# Patient Record
Sex: Female | Born: 1985
Health system: Southern US, Community
[De-identification: ages and names within clinical notes are randomized; demographics above are authoritative.]

## PROBLEM LIST (undated history)

## (undated) DIAGNOSIS — E119 Type 2 diabetes mellitus without complications: Secondary | ICD-10-CM

## (undated) DIAGNOSIS — E669 Obesity, unspecified: Secondary | ICD-10-CM

## (undated) DIAGNOSIS — J45909 Unspecified asthma, uncomplicated: Secondary | ICD-10-CM

## (undated) HISTORY — PX: TUBAL LIGATION: SHX77

---

## 2005-12-22 ENCOUNTER — Inpatient Hospital Stay (HOSPITAL_COMMUNITY): Admission: AD | Admit: 2005-12-22 | Discharge: 2005-12-24 | Payer: Self-pay | Admitting: Family Medicine

## 2005-12-22 ENCOUNTER — Ambulatory Visit: Payer: Self-pay | Admitting: Obstetrics & Gynecology

## 2005-12-25 ENCOUNTER — Ambulatory Visit: Payer: Self-pay | Admitting: Obstetrics & Gynecology

## 2005-12-28 ENCOUNTER — Inpatient Hospital Stay (HOSPITAL_COMMUNITY): Admission: AD | Admit: 2005-12-28 | Discharge: 2005-12-29 | Payer: Self-pay | Admitting: Family Medicine

## 2005-12-28 ENCOUNTER — Ambulatory Visit: Payer: Self-pay | Admitting: *Deleted

## 2006-01-01 ENCOUNTER — Ambulatory Visit: Payer: Self-pay | Admitting: Obstetrics & Gynecology

## 2006-01-11 ENCOUNTER — Ambulatory Visit: Payer: Self-pay | Admitting: Family Medicine

## 2006-01-11 ENCOUNTER — Inpatient Hospital Stay (HOSPITAL_COMMUNITY): Admission: AD | Admit: 2006-01-11 | Discharge: 2006-01-14 | Payer: Self-pay | Admitting: Obstetrics and Gynecology

## 2006-01-11 ENCOUNTER — Encounter (INDEPENDENT_AMBULATORY_CARE_PROVIDER_SITE_OTHER): Payer: Self-pay | Admitting: Specialist

## 2006-01-18 ENCOUNTER — Ambulatory Visit: Payer: Self-pay | Admitting: *Deleted

## 2007-07-16 ENCOUNTER — Inpatient Hospital Stay (HOSPITAL_COMMUNITY): Admission: AD | Admit: 2007-07-16 | Discharge: 2007-07-17 | Payer: Self-pay | Admitting: Obstetrics & Gynecology

## 2007-07-16 ENCOUNTER — Ambulatory Visit: Payer: Self-pay | Admitting: *Deleted

## 2007-07-18 ENCOUNTER — Inpatient Hospital Stay (HOSPITAL_COMMUNITY): Admission: RE | Admit: 2007-07-18 | Discharge: 2007-07-21 | Payer: Self-pay | Admitting: Obstetrics & Gynecology

## 2007-07-18 ENCOUNTER — Ambulatory Visit: Payer: Self-pay | Admitting: Obstetrics & Gynecology

## 2008-11-10 ENCOUNTER — Ambulatory Visit: Payer: Self-pay | Admitting: Obstetrics & Gynecology

## 2008-11-10 ENCOUNTER — Inpatient Hospital Stay (HOSPITAL_COMMUNITY): Admission: AD | Admit: 2008-11-10 | Discharge: 2008-11-13 | Payer: Self-pay | Admitting: Obstetrics & Gynecology

## 2010-05-29 ENCOUNTER — Encounter: Payer: Self-pay | Admitting: Obstetrics & Gynecology

## 2010-07-18 ENCOUNTER — Emergency Department (HOSPITAL_COMMUNITY)
Admission: EM | Admit: 2010-07-18 | Discharge: 2010-07-19 | Disposition: A | Discharge status: Home / Self Care | Payer: Self-pay | Attending: Emergency Medicine | Admitting: Emergency Medicine

## 2010-07-18 DIAGNOSIS — E876 Hypokalemia: Secondary | ICD-10-CM | POA: Insufficient documentation

## 2010-07-18 DIAGNOSIS — R6884 Jaw pain: Secondary | ICD-10-CM | POA: Insufficient documentation

## 2010-07-18 DIAGNOSIS — R209 Unspecified disturbances of skin sensation: Secondary | ICD-10-CM | POA: Insufficient documentation

## 2010-07-18 DIAGNOSIS — H9209 Otalgia, unspecified ear: Secondary | ICD-10-CM | POA: Insufficient documentation

## 2010-07-18 DIAGNOSIS — M542 Cervicalgia: Secondary | ICD-10-CM | POA: Insufficient documentation

## 2010-07-18 DIAGNOSIS — R51 Headache: Secondary | ICD-10-CM | POA: Insufficient documentation

## 2010-07-18 DIAGNOSIS — D573 Sickle-cell trait: Secondary | ICD-10-CM | POA: Insufficient documentation

## 2010-07-18 DIAGNOSIS — J45909 Unspecified asthma, uncomplicated: Secondary | ICD-10-CM | POA: Insufficient documentation

## 2010-07-19 LAB — BASIC METABOLIC PANEL
BUN: 8 mg/dL (ref 6–23)
Calcium: 8.7 mg/dL (ref 8.4–10.5)
Creatinine, Ser: 0.76 mg/dL (ref 0.4–1.2)
GFR calc Af Amer: >60 mL/min (ref >60)

## 2010-08-14 LAB — CBC
MCHC: 32.8 g/dL (ref 30.0–36.0)
MCV: 74.4 fL — ABNORMAL LOW (ref 78.0–100.0)
MCV: 74.6 fL — ABNORMAL LOW (ref 78.0–100.0)
Platelets: 155 10*3/uL (ref 150–400)
Platelets: 196 10*3/uL (ref 150–400)
RBC: 4.27 MIL/uL (ref 3.87–5.11)
WBC: 6.9 10*3/uL (ref 4.0–10.5)
WBC: 7.9 10*3/uL (ref 4.0–10.5)

## 2010-08-14 LAB — COMPREHENSIVE METABOLIC PANEL
ALT: 14 U/L (ref 0–35)
ALT: 9 U/L (ref 0–35)
AST: 18 U/L (ref 0–37)
AST: 23 U/L (ref 0–37)
Albumin: 2.1 g/dL — ABNORMAL LOW (ref 3.5–5.2)
Albumin: 2.6 g/dL — ABNORMAL LOW (ref 3.5–5.2)
CO2: 20 mEq/L (ref 19–32)
Calcium: 8.3 mg/dL — ABNORMAL LOW (ref 8.4–10.5)
Calcium: 8.5 mg/dL (ref 8.4–10.5)
Chloride: 106 mEq/L (ref 96–112)
Chloride: 108 mEq/L (ref 96–112)
Creatinine, Ser: 0.63 mg/dL (ref 0.4–1.2)
Creatinine, Ser: 0.63 mg/dL (ref 0.4–1.2)
GFR calc Af Amer: >60 mL/min (ref >60)
GFR calc Af Amer: >60 mL/min (ref >60)
GFR calc non Af Amer: >60 mL/min (ref >60)
Sodium: 135 mEq/L (ref 135–145)
Sodium: 138 mEq/L (ref 135–145)
Total Bilirubin: 0.7 mg/dL (ref 0.3–1.2)

## 2010-08-14 LAB — BASIC METABOLIC PANEL
BUN: 3 mg/dL — ABNORMAL LOW (ref 6–23)
Chloride: 106 mEq/L (ref 96–112)
Creatinine, Ser: 0.6 mg/dL (ref 0.4–1.2)
Glucose, Bld: 94 mg/dL (ref 70–99)
Potassium: 3.5 mEq/L (ref 3.5–5.1)

## 2010-08-14 LAB — URINALYSIS, ROUTINE W REFLEX MICROSCOPIC
Bilirubin Urine: NEGATIVE
Hgb urine dipstick: NEGATIVE
Ketones, ur: 15 mg/dL — AB
Nitrite: NEGATIVE
Protein, ur: NEGATIVE mg/dL
Specific Gravity, Urine: 1.015 (ref 1.005–1.030)
Urobilinogen, UA: 1 mg/dL (ref 0.0–1.0)

## 2010-08-14 LAB — RAPID URINE DRUG SCREEN, HOSP PERFORMED
Amphetamines: NOT DETECTED
Opiates: NOT DETECTED
Tetrahydrocannabinol: NOT DETECTED

## 2010-08-14 LAB — CROSSMATCH
ABO/RH(D): O POS
Antibody Screen: NEGATIVE

## 2010-08-14 LAB — RPR: RPR Ser Ql: NONREACTIVE

## 2010-08-14 LAB — RUBELLA SCREEN: Rubella: 31.6 IU/mL — ABNORMAL HIGH

## 2010-08-14 LAB — SICKLE CELL SCREEN: Sickle Cell Screen: POSITIVE — AB

## 2010-08-14 LAB — HEMOGLOBIN A1C: Hgb A1c MFr Bld: 5.6 % (ref 4.6–6.1)

## 2010-08-14 LAB — URIC ACID: Uric Acid, Serum: 6.4 mg/dL (ref 2.4–7.0)

## 2010-09-20 NOTE — Discharge Summary (Signed)
NAMECHELESA, [name]             ACCOUNT NO.:  [id number]   MEDICAL RECORD NO.:  [id number]          PATIENT TYPE:  INP   LOCATION:  9126                          [facility]   PHYSICIAN:  Phil D. Okey Dupre, M.D.     DATE OF BIRTH:  1986-04-12   DATE OF ADMISSION:  07/18/2007  DATE OF DISCHARGE:  07/21/2007                               DISCHARGE SUMMARY   ADMISSION DIAGNOSES:  1. Intrauterine pregnancy at 39-week 6-day gestation.  2. History of previous cesarean sections x2.  3. No prenatal care.  4. Obesity.  5. Anemia.  6. Group B streptococcus negative.   DISCHARGE DIAGNOSES:  1. Postoperative day #3 from repeat low-transverse cesarean section of      a viable infant female, weighing 6 pounds 11 ounces with Apgars 8      at one minute and 9 at five minutes.  2. History of previous cesarean sections x2.  3. No prenatal care.  4. Obesity.  5. Anemia.  6. Group B streptococcus negative.   PROCEDURES:  The patient had a repeat low-transverse cesarean section  and lysis of adhesions performed by Dr. Nicholaus Bloom on July 18, 2007.   COMPLICATIONS:  None.   CONSULTATIONS:  None.   PERTINENT LABORATORY FINDINGS:  Two days prior to admission, the patient  had a CBC with white blood cell count 6.9, hemoglobin 10.6, hematocrit  32.2, and platelets 192.  On postoperative day #1, she had an RPR that  was nonreactive.  Complete blood count was white blood cell count 7.7,  hemoglobin 8.9, hematocrit 27.1, and platelets 179,000.   BRIEF PERTINENT ADMISSION HISTORY:  Ms. Matsuura is a 25 year old gravida  3, para 2-0-0-2, 39-week 6-day gestation by LMP.  She had a late  ultrasound at 39 weeks and is fairly certain of her LMP of October 12, 2006.  She has a history of 2 previous C-sections and presents for repeat low-  transverse cesarean section.  She had one MAU visit at which time she  was posted for the cesarean section.   HOSPITAL COURSE:  The patient was admitted, and C-section was  performed  on July 18, 2007.  Repeat low-transverse C-section resulted in delivery  of a viable infant female weighing 6 pounds and 11 ounces with Apgars of  8 at one minute and 9 at five minutes.  The mother and infant tolerated  the delivery well.  She had an uncomplicated postoperative course with  normal physical examination and vital signs.  She desired to have oral  contraceptive pills for birth control and was bottle-feeding the baby.  She was in stable condition for discharge on postoperative day #3.   DISCHARGE STATUS:  Stable.   DISCHARGE MEDICATIONS:  1. Prenatal vitamins 1 tablet p.o. daily.  2. Vicodin 5 one tablet every 4 hours as needed for pain.  3. Ibuprofen 600 mg 1 tablet every 6 hours as needed for pain.  4. Colace 100 mg 1 tablet by mouth twice daily as needed while on      iron.  5. Ferrous sulfate 325 mg 1 tablet twice a day  for the first month      after delivery.   DISCHARGE INSTRUCTIONS:  1. Discharged home.  2. Regular diet.  3. No lifting greater than 10 pounds x6 weeks.  4. No sex or anything in the vagina for 6 weeks.  5. The patient is to have baby love nurse follow on postoperative day      #7 for removal of staples.  6. The patient is to follow up with the [facility]      Department for routine postpartum examination.      Karlton Lemon, MD  Electronically Signed     ______________________________  Javier Glazier. Okey Dupre, M.D.    NS/MEDQ  D:  07/21/2007  T:  07/21/2007  Job:  [id number]

## 2010-09-20 NOTE — Discharge Summary (Signed)
[name], GRANDPRE NO.:  [id number]   MEDICAL RECORD NO.:  [id number]          PATIENT TYPE:  INP   LOCATION:  9117                          [facility]   PHYSICIAN:  Scheryl Darter, MD       DATE OF BIRTH:  Oct 12, 1985   DATE OF ADMISSION:  11/10/2008  DATE OF DISCHARGE:  11/13/2008                               DISCHARGE SUMMARY   PRIMARY CARE [employer]:  [facility].   DISCHARGE DIAGNOSES:  1. Repeat low-transverse cesarean section.  2. Bilateral tubal ligation.  3. Late prenatal care.  4. Hypertension.  5. Poor social situation.   CONSULTS:  OB was heavily involved in Ms. Lohmann care from the repeat C-  section to the bilateral tubal ligation and they are aware of her  course.   PROCEDURES:  1. Repeat low-transverse cesarean section on November 10, 2008.  2. Bilateral tubal ligation on November 11, 2008, Please see notes for the      details.   LABORATORY DATA:  Pertinent lab on November 10, 2008, potassium was 2.8  repleted on November 12, 2008, it was 3.5, also significant.  Hemoglobin  10.4, hematocrit 38.1 on November 12, 2008.   Checked further labs; sickle cell screen positive for trait.  Hemoglobin  A1c 5.6, RPR nonreactive, rubella immune, HIV nonreactive, hepatitis B  surface antigen negative.  Urine drug screen panel negative and PIH labs  were essentially negative with the exception of the above mentioned  potassium and elevated alkaline phosphatase at 209.   BRIEF HOSPITAL COURSE:  This is a 25 year old now G4, P4, to being  discharged on postoperative day #4 status post repeat low-transverse  cesarean section and bilateral tubal ligation.  Ms. Bahena was admitted  in Labor and had her repeat cesarean section.  There were essentially no  complications with this procedure.  She also on postop day #2 had a  bilateral tubal ligation, which went well with no complications.  Significantly following her cesarean section, Ms. Kreft was  found to  have hypertension with systolics mostly in the 150s.  It was treated on  postop day #2 starting with oral labetalol 100 mg twice a day.  That was  continued on postop day #3 and she was given prescription for 2 months  and instructed to follow up with the [facility] Department in 2 weeks.  She  was not given hydrochlorothiazide due to her low potassium on July 6.  She is aware do not discontinue this medicine on her own and to make her  doctors appointments to manage her hypertension.  She is aware of the  danger of untreated hypertension of the course of her life.  Also, of  note, Ms. Slaymaker had a social work visit while in this hospitalization.  They will see her as needed.   Discharge instructions were given verbally to Ms. Kouba.  Also, to avoid  washing her incision wound for the next 6 weeks and avoid taking bath or  swimming in lakes or pools.  She also was informed about the importance  of taking her  labetalol every day and the necessity to make her followup  appointments.   PENDING ISSUES TO BE FOLLOWED AFTER DISCHARGE:  1. Hypertension f/u on jul7 19  weeks at [facility].  2. Routine postnatal visit in 6 weeks at the [facility] and for      appointments, please see above.   DISCHARGE CONDITION:  The patient was discharged in stable medical  condition to home.      Clementeen Graham, MD      Scheryl Darter, MD  Electronically Signed    EC/MEDQ  D:  11/13/2008  T:  11/13/2008  Job:  [id number]

## 2010-09-20 NOTE — Op Note (Signed)
[name], [name]             ACCOUNT NO.:  [id number]   MEDICAL RECORD NO.:  [id number]          PATIENT TYPE:  INP   LOCATION:  9126                          [facility]   PHYSICIAN:  Allie Bossier, MD        DATE OF BIRTH:  09-Mar-1986   DATE OF PROCEDURE:  07/18/2007  DATE OF DISCHARGE:                               OPERATIVE REPORT   PREOPERATIVE DIAGNOSES:  1. Intrauterine pregnancy at 39 weeks 6 days gestation.  2. No prenatal care.  3. Obesity.  4. Anemia.  5. Group B streptococcus negative.   POSTOPERATIVE DIAGNOSES:  1. Intrauterine pregnancy at 39 weeks 6 days gestation.  2. No prenatal care.  3. Obesity.  4. Nuchal cord times one.  5. Anemia.  6. Group B streptococcus negative.   PROCEDURE:  Repeat low transverse cesarean section and lysis of  adhesions.   SURGEON:  Allie Bossier, M.D.   ASSISTANT:  Karlton Lemon, M.D.   ANESTHESIA:  Spinal.   FINDINGS:  1. Viable infant female, weighing 6 pounds 10 ounces, with Apgars of 8      at one minute, 9 at five minutes, in vertex presentation.  2. Nuchal cord times one.  3. Clear amniotic fluid.  4. Normal female pelvic anatomy with normal changes of pregnancy.  5. Extensive adhesions intraperitoneally.   ESTIMATED BLOOD LOSS:  500 mL.   DRAINS:  Foley with clear yellow urine throughout the entire procedure.   COMPLICATIONS:  None immediate.   SPECIMENS:  Placenta to labor and delivery; cord blood to the cord bank.   INDICATIONS FOR PROCEDURE:  Ms. [name] is a 25 year old gravida 3, para 2-  0-0-2, at 8 weeks 6 days gestation by an LMP of October 12, 2006.  She has  had no prenatal care during this pregnancy and has a history of two  previous C-sections.  She was seen two days previously, on March 10, in  the maternity admission unit, at which time she was scheduled for repeat  low transverse C- section at 39 weeks.   DESCRIPTION OF PROCEDURE:  The patient was taken to the operating room  and, after  obtaining adequate spinal anesthesia, was prepped and draped  in the usual sterile manner in the supine position with left lateral  uterine displacement.  After insuring adequate anesthesia, a  Pfannenstiel skin incision was made, using the scalpel.  The incision  was carried down through the subcutaneous tissues, using the scalpel.  The rectus fascia was nicked in the midline and the incision was  extended laterally in each direction, using the Mayo scissors.  The  Bovie cautery was then used to incise through the rectus fascia in the  midline.  The rectus muscles were then partially separated in a modified  Maylard technique, cutting through approximately 50% of the muscle  bilaterally.  Good hemostasis was noted as the muscles were dissected.  The parietal peritoneum was then identified and being grasped with two  hemostats, and was then entered sharply with Metzenbaum scissors.  The  peritoneum was then dissected under direct visualization with the  Bovie  cautery.  Adhesions were then broken down with Bovie cautery.  The  uterus was well-visualized and a bladder blade was placed.  A low  transverse uterine incision was then made and the incision was extended  laterally and superiorly with blunt dissection and bandage scissors.  The amnion was ruptured with clear fluid noted.  A hand was then placed  within the uterine cavity, used to elevate and flex the head of the  infant, which was delivered without difficulty.  The infant was bulb  suctioned after delivery of the head.  The infant's body was then  delivered atraumatically.  The infant spontaneously cried upon delivery.  The cord was clamped times two and incised between the clamps.  The  placenta was then delivered manually and cord blood was to be collected  by the cord blood bank.  The endometrial cavity was wiped free of any  traces of membranes with wet laparotomy sponges.  The uterine incision  was then closed with one stitch  of running, locking 0 chromic.  One area  of bleeding was controlled with a figure-of-eight suture.  The incision  was then inspected and found to have good hemostasis.  The ovaries were  then palpated bilaterally and felt normal.  The rectus fascia was then  reapproximated with one suture of 0 Prolene.  Small areas of bleeding in  the subcutaneous tissue were controlled using the Bovie cautery.  Skin  was reapproximated with stainless steel skin staples.  Sponge, needle  and instrument counts were correct times two.  The patient tolerated the  procedure well and went to the postanesthesia care unit in stable  condition.      Karlton Lemon, MD  Electronically Signed     ______________________________  Allie Bossier, MD    NS/MEDQ  D:  07/18/2007  T:  07/19/2007  Job:  [id number]

## 2010-09-20 NOTE — Op Note (Signed)
[name], KRAUSZ NO.:  [id number]   MEDICAL RECORD NO.:  [id number]          PATIENT TYPE:  INP   LOCATION:  9117                          [facility]   PHYSICIAN:  Scheryl Darter, MD       DATE OF BIRTH:  10-19-1985   DATE OF PROCEDURE:  11/10/2008  DATE OF DISCHARGE:                               OPERATIVE REPORT   PROCEDURE:  Repeat low-transverse cesarean section, bilateral tubal  ligation.   PREOPERATIVE DIAGNOSES:  1. Intrauterine pregnancy at [redacted] weeks gestation in labor.  2. Three previous cesarean section.  3. Undesired fertility.   POSTOPERATIVE DIAGNOSES:  1. Intrauterine pregnancy, delivered.  2. Permanent sterilization.   SURGEON:  Scheryl Darter, MD   ASSISTANT:  Odie Sera, DO   ANESTHESIA:  Epidural.   ESTIMATED BLOOD LOSS:  800 mL.   COMPLICATIONS:  None.   DRAINS:  Foley catheter.   COUNTS:  Correct.   HOSPITAL COURSE:  The patient gave written consent for repeat cesarean  section at [redacted] weeks gestation.  The patient has no prenatal care.  She  desired permanent sterilization and signed consent.  The patient  identification was confirmed.  She was brought to the OR and epidural  anesthesia was induced.  She is placed in dorsal supine position left  lateral tilt.  Adequate epidural anesthesia was confirmed.  Abdomen was  sterilely prepped and draped.  Foley catheter was placed.  A #10 blade  was used to make Pfannenstiel incision site of previous cesarean section  scars.  Incision was carried down to the fascia and fascia was incised  and incision was extended transversely with scissors.  Good hemostasis  was assured.  Fascia was separated from the underlying scar and tissue  attachments with blunt and sharp dissection.  Peritoneum was entered at  this point.  There were adhesions of the omentum to the anterior  abdominal wall, but these were filmy.  Adhesions were taken down with  cautery.  Peritoneal cavity was exposed.   Bladder was dissected down  using Metzenbaum scissors.  Bladder blade was inserted.  Lower uterine  segment was identified.  A #10 blade was used to make a transverse  incision in the lower uterine segment and peritoneal catheter in the  abdominal skin and the uterine cavity was entered.  Clear fluid was  expressed.  Incision was extended transversely with blunt traction.  Fetal head was elevated from the pelvis without very scant and head was  delivered.  Mouth and nose were cleared with bulb suction.  Infant was  delivered atraumatically and was vigorous at birth.  Infant was live  born female at 83 8 pounds 5 ounces, Apgars 8 and 9.  Cord was clamped  and cut, and the infant was handed to the pediatric personnel.  Sent to  labor and delivery.  Uterine cavity was explored.  The patient received  IV Pitocin.  She had received IV Kefzol prior to incision.  Uterus was  exteriorized.  Bladder blade was reinserted.  A 0 Vicryl was used in  running locking suture and closed the uterine  incision.  Good hemostasis  was seen.  Attention then turned to the sterilization.  Filshie clips  were placed.  The tubes on both sides with good positioning seen.  Both  tubes and ovaries appeared normal.  The uterus was returned to the  abdominal cavity.  Pelvic gutters were irrigated.  Good hemostasis was  assured.  A more omental wall adhesions were lysed using cautery.  The  anterior peritoneum was closed with running suture with 2-0 Vicryl.  Fascia was closed with running suture with 0 Vicryl.  Interrupted  sutures were placed in Scarpa fascia with a 2-0 plain gut.  Good  hemostasis was assured in the incision.  Incision was irrigated.  Skin  was closed with a running subcuticular suture with 4-0 Vicryl.  Sterile  dressing was applied.  The patient tolerated the procedure well without  complications.  She was brought in stable condition and recovered.  Estimated blood loss was 8-9 mL.      Scheryl Darter, MD  Electronically Signed     JA/MEDQ  D:  11/10/2008  T:  11/11/2008  Job:  [id number]

## 2010-09-23 NOTE — Op Note (Signed)
[name], [name]             ACCOUNT NO.:  [id number]   MEDICAL RECORD NO.:  [id number]          PATIENT TYPE:  INP   LOCATION:                                [facility]   PHYSICIAN:  Tanya S. Shawnie Pons, M.D.   DATE OF BIRTH:  1985/08/25   DATE OF PROCEDURE:  01/11/2006  DATE OF DISCHARGE:                                 OPERATIVE REPORT   PREOPERATIVE DIAGNOSIS:  Intrauterine pregnancy at 36 6/7 weeks, breech  fetus, IUGR, oligohydramnios, previous cesarean section.   POSTOPERATIVE DIAGNOSIS:  Intrauterine pregnancy at 36 6/7 weeks, breech  fetus, IUGR, oligohydramnios, previous cesarean section.   PROCEDURE:  Repeat low transverse cesarean section.   SURGEON:  Shelbie Proctor. Shawnie Pons, M.D.   ASSISTANT:  Paticia Stack, M.D.   ANESTHESIA:  Spinal.   SPECIMENS:  Placenta sent to pathology.   ESTIMATED BLOOD LOSS:  1000 mL.   COMPLICATIONS:  None.   FINDINGS:  Viable female infant, Apgars 8 and 9, weight 4 pounds 7 ounces,  pH 7.3.   INDICATIONS FOR PROCEDURE:  The patient is a 25 year old G2, P1-0-0-1, at 9  6/[redacted] weeks gestation by dates.  The patient is very sure about her dates.  There was some discrepancy with the 31 week ultrasound but estimated date of  confinement is February 20, 2006.  Very small baby.  The patient had non-  reassuring biophysical profile, developed oligohydramnios, was recommended  for a C-section per MFM.   DESCRIPTION OF PROCEDURE:  The patient was taken to the operating room where  spinal anesthesia was found to be adequate.  She was then prepped and draped  in a normal sterile fashion in the dorsal supine position with a leftward  tilt.  A Pfannenstiel skin incision was then made with a scalpel and carried  through to the underlying layer of fascia.  The fascia was incised in the  midline and the incision extended laterally with the Mayo scissors.  The  superior aspect of the fascial incision was then grasped with the Kocher  clamps, elevated,  and the underlying rectus muscles dissected off bluntly.  Attention was turned to the inferior aspect of the incision which, in a  similar fashion, was grasped, tented up with the Kocher clamps, and the  rectus muscles dissected off bluntly.  The rectus muscles were then  separated in the midline and the peritoneum identified and entered sharply  with the Metzenbaum scissors.  The peritoneal incision was then extended  superiorly and inferiorly with good visualization of the bladder.  The  omentum was adherent to the anterior abdominal wall and the uterus.  It was  taken down by electrocautery and sequential clamping, was cut, and tied  using free ligature.  The left rectus muscle was also taken down using  electrocautery.  The bladder blade was then inserted and the vesicouterine  peritoneum identified.  The lower uterine segment was incised in a  transverse fashion with the scalpel.  The uterine incision was extended  laterally.  The bladder blade was removed.  The infant was found in a breech  position  with the buttocks presenting.  The infant was delivered  atraumatically.  The nose and mouth were suctioned with the bulb, the cord  clamped and cut, the infant taken over to the warmer, NICU present, cord  gases were obtained, cord blood was sent.  The placenta was then removed  manually.  The uterus was cleared of all clots and debris.  The uterine  incision was repaired with #1 chromic in a running locked fashion.  The  uterus was closed with one layer.  Pressure was applied to the uterine  incision.  It was rechecked 2-3 times with good hemostasis.  The fascia was  reapproximated with 0 Vicryl in a running fashion.  The skin was closed with  staples.  The patient tolerated the procedure well.  0.25% Marcaine was used  for local anesthesia around the incision.  Sponge, lap, and needle counts  were correct.  The patient was taken to the recovery room in stable  condition.      ______________________________  Paticia Stack, MD    ______________________________  Shelbie Proctor. Shawnie Pons, M.D.    LNJ/MEDQ  D:  01/11/2006  T:  01/11/2006  Job:  [id number]

## 2010-09-23 NOTE — Discharge Summary (Signed)
[name], [name]             ACCOUNT NO.:  [id number]   MEDICAL RECORD NO.:  [id number]          PATIENT TYPE:  INP   LOCATION:  9156                          [facility]   PHYSICIAN:  Tanya S. Shawnie Pons, M.D.   DATE OF BIRTH:  05/08/1986   DATE OF ADMISSION:  12/22/2005  DATE OF DISCHARGE:  12/24/2005                                 DISCHARGE SUMMARY   ADMISSION DIAGNOSES:  1. Thirty-five and two-sevenths week intrauterine pregnancy with no      prenatal care, suspicious for intrauterine growth restriction with      elevated UASD Doppler flow.  2. History of gestational diabetes.  3. Previous cesarean section.   DISCHARGE DIAGNOSES:  1. Thirty-five and two-sevenths with no prenatal care.  2. Probable A2 or B gestational diabetes without treatment.  3. Fetal growth restriction with abnormal Dopplers.  4. Breech presentation.  5. Fetal risk for sickle cell disease 1/4.   HISTORY:  This is  G2, P1-0-0-1, presenting at 35 weeks by menstrual dating  who had no prenatal care but was having some cramping.  She felt that she  may be contracting, had no rupture of membranes, no bleeding, good fetal  movement.  She states she did get some early prenatal care in [place] and  reported [facility] was February 05, 2006.  She has no known pregnancy complications.   MEDS:  Vitamins.   ALLERGIES:  NONE.   OB HISTORY:  In April, 2006, she had a primary LTCS at full-term, 6 pounds,  her fetal heart rate nonreassuring, she was not in labor, she did have diet  controlled gestational diabetes.   MEDICAL HISTORY:  Significant for sickle cell trait, father of baby also has  sickle cell trait.  She also has history of asthma.   FAMILY HISTORY:  Significant for diabetes and chronic hypertension.   ADMISSION PHYSICAL EXAM:  BP 131/72, afebrile, no distress.  LUNGS:  Clear.  ABDOMEN:  Gravid.  EXTREMITIES:  Without edema.   Fetal heart rate was 140 and reactive with no decelerations and moderate  variability.  Her complete OB ultrasound found estimated fetal weight to be  less than 50% based on her sure LMP and her uterine umbilical artery  systolic/diastolic ratio was slightly elevated at 4.22.  She was admitted  for observation and plan was to get a Maternal Fetal Medicine consult.  She  had a prolonged hospital course.  She did have a prolonged negative NST and  reassuring __________.  It was felt that induction was not indicated due to  the problems with dating the pregnancy.  Glucose challenge test was done  result was 203.  Her fasting during the hospitalization was 106 and PC  dinner was 147, this was after she was put on a carb restricted diet.  Fetal  heart rate tracing remained reassuring.  On hospital day 2, she was  discharged home in consultation with Dr. Penne Lash.  Follow up was to be at  [facility], with Maternal Fetal Medicine consult, both on December 25, 2005.   DISCHARGE MEDS:  1. Prenatal vitamin  one a day.  2. Glyburide 1.25 mg p.o. q.a.m., 2.5 mg h.s.   She got a Glucometer to do home sugar testing and saw the nutritionist in  consultation and was told to do __________ count until her next visit.  Discharged home in stable condition.      Deirdre Christy Gentles, C.N.M.    ______________________________  Shelbie Proctor. Shawnie Pons, M.D.    DP/MEDQ  D:  01/13/2006  T:  01/14/2006  Job:  [id number]

## 2010-09-23 NOTE — Discharge Summary (Signed)
NAMETAMANI, DURNEY             ACCOUNT NO.:  [id number]   MEDICAL RECORD NO.:  [id number]          PATIENT TYPE:  INP   LOCATION:  9124                          FACILITY:  [facility]   PHYSICIAN:  Phil D. Okey Dupre, M.D.     DATE OF BIRTH:  [birth year]   DATE OF ADMISSION:  01/11/2006  DATE OF DISCHARGE:  01/14/2006                                 DISCHARGE SUMMARY   ADMISSION DIAGNOSES:  1. Intrauterine pregnancy at 34 weeks.  2. Oligohydramnios.  3. Intrauterine growth retardation.  4. Breech presentation.  5. Previous cesarean section.   DISCHARGE DIAGNOSES:  1. Term intrauterine pregnancy, delivered via cesarean section.  2. Oligohydramnios.  3. Breech presentation.  4. Intrauterine growth retardation.  5. Anemia of pregnancy.  6. Repeat cesarean section.   PRENATAL LABS:  Blood type O positive, antibody negative. Rubella immune.  Hepatitis B surface antigen negative. Syphilis nonreactive. HIV nonreactive.  Gonorrhea/Chlamydia negative. GBS unknown.   HISTORY OF PRESENT ILLNESS:  The patient is a 25 year old gravida 2, para 1,  0, 0, 1 who presented at 34-2/[redacted] weeks gestation by a third trimester  ultrasound. She presented with a non reassuring physical profile, AFI of 3.5  cm, breech presentation found on ultrasound. Patient with a history of a  prior cesarean section and was taken for repeat cesarean section. Her  prenatal course was complicated by late prenatal care, gestational diabetic,  history of a prior cesarean section. [facility] with onset of care at 31+ weeks.   HOSPITAL COURSE:  The patient was admitted and taken for repeat cesarean  section. Please see operative note for complete details. The patient  remained hemodynamically stable throughout the hospital course with no  complications. Her postpartum blood sugars were in the low 100's. Patient  was felt to be stable for discharge on postoperative day number 3. She was  discharged home  with instructions to follow up at [facility] in 6 weeks.   DISCHARGE MEDICATIONS:  Included Ibuprofen 600 mg q.6 hours p.r.n. pain,  Micronor 1 tablet daily, Colace 100 mg b.i.d.     ______________________________  Paticia Stack, MD    ______________________________  Javier Glazier. Okey Dupre, M.D.    LNJ/MEDQ  D:  03/13/2006  T:  03/13/2006  Job:  [id number]

## 2011-01-30 LAB — WET PREP, GENITAL

## 2011-01-30 LAB — URINALYSIS, ROUTINE W REFLEX MICROSCOPIC
Nitrite: NEGATIVE
Protein, ur: NEGATIVE
Urobilinogen, UA: 0.2

## 2011-01-30 LAB — TYPE AND SCREEN: ABO/RH(D): O POS

## 2011-01-30 LAB — DIFFERENTIAL
Basophils Relative: 0
Eosinophils Absolute: 0.1
Eosinophils Relative: 2
Lymphs Abs: 1.5
Monocytes Relative: 8
Neutrophils Relative %: 67

## 2011-01-30 LAB — CBC
HCT: 27.1 — ABNORMAL LOW
HCT: 32.2 — ABNORMAL LOW
Hemoglobin: 10.6 — ABNORMAL LOW
Hemoglobin: 8.9 — ABNORMAL LOW
MCHC: 32.8
MCHC: 32.9
MCV: 74.6 — ABNORMAL LOW
MCV: 75.6 — ABNORMAL LOW
Platelets: 178
Platelets: 192
RBC: 3.59 — ABNORMAL LOW
RBC: 4.32
RDW: 16.3 — ABNORMAL HIGH
RDW: 16.5 — ABNORMAL HIGH
WBC: 6.9
WBC: 7.7

## 2011-01-30 LAB — SYPHILIS: RPR W/REFLEX TO RPR TITER AND TREPONEMAL ANTIBODIES, TRADITIONAL SCREENING AND DIAGNOSIS ALGORITHM
RPR Ser Ql: NONREACTIVE
RPR Ser Ql: NONREACTIVE

## 2011-01-30 LAB — RAPID URINE DRUG SCREEN, HOSP PERFORMED
Amphetamines: NOT DETECTED
Barbiturates: NOT DETECTED

## 2011-01-30 LAB — SICKLE CELL SCREEN: Sickle Cell Screen: POSITIVE — AB

## 2011-01-30 LAB — STREP B DNA PROBE

## 2011-01-30 LAB — GC/CHLAMYDIA PROBE AMP, GENITAL: GC Probe Amp, Genital: NEGATIVE

## 2011-01-30 LAB — HIV ANTIBODY (ROUTINE TESTING W REFLEX): HIV: NONREACTIVE

## 2011-01-30 LAB — CCBB MATERNAL DONOR DRAW

## 2011-01-30 LAB — HEPATITIS B SURFACE ANTIGEN: Hepatitis B Surface Ag: NEGATIVE

## 2012-10-26 ENCOUNTER — Encounter (HOSPITAL_COMMUNITY): Payer: Self-pay | Admitting: *Deleted

## 2012-10-26 ENCOUNTER — Emergency Department (HOSPITAL_COMMUNITY)
Admission: EM | Admit: 2012-10-26 | Discharge: 2012-10-26 | Disposition: A | Discharge status: Home / Self Care | Payer: Self-pay | Attending: Emergency Medicine | Admitting: Emergency Medicine

## 2012-10-26 DIAGNOSIS — W57XXXA Bitten or stung by nonvenomous insect and other nonvenomous arthropods, initial encounter: Secondary | ICD-10-CM

## 2012-10-26 DIAGNOSIS — Y9389 Activity, other specified: Secondary | ICD-10-CM | POA: Insufficient documentation

## 2012-10-26 DIAGNOSIS — Y9289 Other specified places as the place of occurrence of the external cause: Secondary | ICD-10-CM | POA: Insufficient documentation

## 2012-10-26 DIAGNOSIS — E669 Obesity, unspecified: Secondary | ICD-10-CM | POA: Insufficient documentation

## 2012-10-26 DIAGNOSIS — R599 Enlarged lymph nodes, unspecified: Secondary | ICD-10-CM | POA: Insufficient documentation

## 2012-10-26 DIAGNOSIS — R209 Unspecified disturbances of skin sensation: Secondary | ICD-10-CM | POA: Insufficient documentation

## 2012-10-26 DIAGNOSIS — J45909 Unspecified asthma, uncomplicated: Secondary | ICD-10-CM | POA: Insufficient documentation

## 2012-10-26 DIAGNOSIS — R51 Headache: Secondary | ICD-10-CM | POA: Insufficient documentation

## 2012-10-26 DIAGNOSIS — R591 Generalized enlarged lymph nodes: Secondary | ICD-10-CM

## 2012-10-26 DIAGNOSIS — IMO0001 Reserved for inherently not codable concepts without codable children: Secondary | ICD-10-CM | POA: Insufficient documentation

## 2012-10-26 HISTORY — DX: Unspecified asthma, uncomplicated: J45.909

## 2012-10-26 HISTORY — DX: Obesity, unspecified: E66.9

## 2012-10-26 MED ORDER — TRIAMCINOLONE ACETONIDE 0.5 % EX OINT: TOPICAL_OINTMENT | Freq: Two times a day (BID) | CUTANEOUS | Status: DC

## 2012-10-26 NOTE — ED Provider Notes (Signed)
History     CSN: [id number]  Arrival date & time 10/26/12  1339   First MD Initiated Contact with Patient 10/26/12 1459      Chief Complaint  Patient presents with  . Insect Bite  . Headache    (Consider location/radiation/quality/duration/timing/severity/associated sxs/prior treatment) The history is provided by the patient.    Patient presents to the ED for mosquito bites to right upper arm.  These lesions are very pruritic, and last night she started having intermittent episodes of numbness and paresthesias to her right arm and fingers which quickly resolved.  Prior mosquito bites without localized reaction.  States she woke up this morning with 2 small knots to her left posterior neck. They are not painful but they have been getting progressively bigger throughout the day. They are non-tender and non-draining but are causing somewhat of a headache.  Headache not associated with photophobia, phonophobia, aura, visual disturbance, confusion, AMS, or neck pain.  Has not taken any meds or applied any creams for her sx.  Past Medical History  Diagnosis Date  . Asthma   . Obesity     Past Surgical History  Procedure Laterality Date  . Tubal ligation      History reviewed. No pertinent family history.  History  Substance Use Topics  . Smoking status: Not on file  . Smokeless tobacco: Not on file  . Alcohol Use: No    OB History   Grav Para Term Preterm Abortions TAB SAB Ect Mult Living                  Review of Systems  Skin: Positive for wound.  All other systems reviewed and are negative.    Allergies  Review of patient's allergies indicates no known allergies.  Home Medications  No current outpatient prescriptions on file.  BP 128/58  Pulse 71  Temp(Src) 98.4 F (36.9 C) (Oral)  Resp 16  SpO2 99%  Physical Exam  Nursing note and vitals reviewed. Constitutional: She is oriented to person, place, and time. She appears well-developed and  well-nourished.  HENT:  Head: Normocephalic and atraumatic.  Mouth/Throat: Oropharynx is clear and moist.  Eyes: Conjunctivae and EOM are normal. Pupils are equal, round, and reactive to light.  Neck: Normal range of motion. Neck supple. No rigidity.  No meningeal signs  Cardiovascular: Normal rate, regular rhythm and normal heart sounds.   Pulmonary/Chest: Effort normal and breath sounds normal.  Musculoskeletal: Normal range of motion.  2 small mosquito bites to right upper arm, pruritic; mild localized swelling without erythema, induration, or signs of infection  Lymphadenopathy:       Head (right side): Occipital adenopathy present.  Swollen lymph nodes of right occipital area, non-tender  Neurological: She is alert and oriented to person, place, and time.  Skin: Skin is warm and dry.  Psychiatric: She has a normal mood and affect.    ED Course  Procedures (including critical care time)  Labs Reviewed - No data to display No results found.   1. Bug bites   2. Lymphadenopathy       MDM   Mosquito bites of RUE with mild, localized swelling.  No erythema or signs of infection.  Swollen lymph nodes to right occipital area- no TTP, induration, or fluctuance.  Rx triamcinolone cream.  Instructed to monitor lymph nodes- if become painful may need FU.  Discussed plan with pt, she agreed.  Return precautions advised.      Garlon Hatchet, PA-C  10/26/12 2032 

## 2012-10-26 NOTE — ED Provider Notes (Signed)
Medical screening examination/treatment/procedure(s) were performed by non-physician practitioner and as supervising physician I was immediately available for consultation/collaboration.  Ethelda Chick, MD 10/26/12 2046

## 2012-10-26 NOTE — ED Notes (Signed)
Reports getting a mosquito bite to right upper arm two days ago, last night started having intermittent episodes of numbness to right arm. No neuro deficits noted at triage. Woke up this am with two knots to base of neck and headache.

## 2013-04-01 ENCOUNTER — Emergency Department (HOSPITAL_COMMUNITY): Payer: Self-pay

## 2013-04-01 ENCOUNTER — Encounter (HOSPITAL_COMMUNITY): Payer: Self-pay | Admitting: Emergency Medicine

## 2013-04-01 ENCOUNTER — Emergency Department (HOSPITAL_COMMUNITY)
Admission: EM | Admit: 2013-04-01 | Discharge: 2013-04-01 | Disposition: A | Discharge status: Home / Self Care | Payer: Self-pay | Attending: Emergency Medicine | Admitting: Emergency Medicine

## 2013-04-01 DIAGNOSIS — R0789 Other chest pain: Secondary | ICD-10-CM | POA: Insufficient documentation

## 2013-04-01 DIAGNOSIS — J45909 Unspecified asthma, uncomplicated: Secondary | ICD-10-CM | POA: Insufficient documentation

## 2013-04-01 DIAGNOSIS — R209 Unspecified disturbances of skin sensation: Secondary | ICD-10-CM | POA: Insufficient documentation

## 2013-04-01 DIAGNOSIS — R079 Chest pain, unspecified: Secondary | ICD-10-CM

## 2013-04-01 DIAGNOSIS — E669 Obesity, unspecified: Secondary | ICD-10-CM | POA: Insufficient documentation

## 2013-04-01 DIAGNOSIS — R112 Nausea with vomiting, unspecified: Secondary | ICD-10-CM | POA: Insufficient documentation

## 2013-04-01 DIAGNOSIS — Z3202 Encounter for pregnancy test, result negative: Secondary | ICD-10-CM | POA: Insufficient documentation

## 2013-04-01 LAB — BASIC METABOLIC PANEL
Calcium: 9.2 mg/dL (ref 8.4–10.5)
Creatinine, Ser: 0.78 mg/dL (ref 0.50–1.10)
GFR calc Af Amer: >90 mL/min (ref >90)
GFR calc non Af Amer: >90 mL/min (ref >90)
Sodium: 139 mEq/L (ref 135–145)

## 2013-04-01 LAB — CBC
MCH: 24.6 pg — ABNORMAL LOW (ref 26.0–34.0)
MCHC: 33.1 g/dL (ref 30.0–36.0)
MCV: 74.4 fL — ABNORMAL LOW (ref 78.0–100.0)
Platelets: 232 10*3/uL (ref 150–400)
RDW: 14.1 % (ref 11.5–15.5)

## 2013-04-01 LAB — POCT PREGNANCY, URINE: Preg Test, Ur: NEGATIVE

## 2013-04-01 LAB — POCT I-STAT TROPONIN I: Troponin i, poc: 0 ng/mL (ref 0.00–0.08)

## 2013-04-01 NOTE — ED Notes (Signed)
Pt reports "tingling of tongue" followed by left arm tingling and sharp pain and chest pain. No sob with this.

## 2013-04-01 NOTE — ED Provider Notes (Signed)
CSN: [id number]     Arrival date & time 04/01/13  1632 History   First MD Initiated Contact with Patient 04/01/13 1916     Chief Complaint  Patient presents with  . Chest Pain   (Consider location/radiation/quality/duration/timing/severity/associated sxs/prior Treatment) HPI [name] is a 27 y.o. female since emergency department complaining of an episode of chest pain that began suddenly when she was getting out of the car. States symptoms lasted approximately an hour and resolved at this time. Patient states she felt sharp pain in the left side of the chest, followed by tingling in left arm, and "tingling of the tongue." Patient states that she has never had these symptoms before, and states that it worried her so she came to emergency department. Patient states that nothing makes her symptoms better or worse. Pain does not radiate. Denies associated shortness of breath, dizziness. States she did get nauseated and vomited once. Patient states she has history of asthma but this does not feel like her asthma. Patient denies any fever, chills, malaise. She denies any cough. She denies any recent travel or surgeries. She's not a smoker. She has no history of blood clots.  Past Medical History  Diagnosis Date  . Asthma   . Obesity    Past Surgical History  Procedure Laterality Date  . Tubal ligation     No family history on file. History  Substance Use Topics  . Smoking status: Never Smoker   . Smokeless tobacco: Not on file  . Alcohol Use: No   OB History   Grav Para Term Preterm Abortions TAB SAB Ect Mult Living                 Review of Systems  Constitutional: Negative for fever and chills.  HENT: Negative for congestion.   Respiratory: Positive for chest tightness. Negative for cough, shortness of breath and wheezing.   Cardiovascular: Positive for chest pain. Negative for palpitations and leg swelling.  Gastrointestinal: Negative for nausea, vomiting, abdominal pain  and diarrhea.  Genitourinary: Negative for dysuria and flank pain.  Musculoskeletal: Negative for arthralgias, myalgias, neck pain and neck stiffness.  Skin: Negative for rash.  Neurological: Negative for dizziness, weakness and headaches.  All other systems reviewed and are negative.    Allergies  Review of patient's allergies indicates no known allergies.  Home Medications   Current Outpatient Rx  Name  Route  Sig  Dispense  Refill  . acetaminophen (TYLENOL) 325 MG tablet   Oral   Take 650 mg by mouth daily as needed for mild pain or headache.          BP 124/70  Pulse 74  Temp(Src) 98.4 F (36.9 C) (Oral)  Resp 20  Ht 5\' 2"  (1.575 m)  Wt 220 lb (99.791 kg)  BMI 40.23 kg/m2  SpO2 100%  LMP 03/26/2013 Physical Exam  Nursing note and vitals reviewed. Constitutional: She appears well-developed and well-nourished. No distress.  HENT:  Head: Normocephalic.  Eyes: Conjunctivae are normal.  Neck: Neck supple.  Cardiovascular: Normal rate, regular rhythm and normal heart sounds.   Pulmonary/Chest: Effort normal and breath sounds normal. No respiratory distress. She has no wheezes. She has no rales. She exhibits no tenderness.  Abdominal: Soft. Bowel sounds are normal. She exhibits no distension. There is no tenderness. There is no rebound.  Musculoskeletal: She exhibits no edema.  Neurological: She is alert.  Skin: Skin is warm and dry.  Psychiatric: She has a normal mood and affect.  Her behavior is normal.    ED Course  Procedures (including critical care time) Labs Review Labs Reviewed  CBC - Abnormal; Notable for the following:    RBC 5.20 (*)    MCV 74.4 (*)    MCH 24.6 (*)    All other components within normal limits  BASIC METABOLIC PANEL - Abnormal; Notable for the following:    Glucose, Bld 108 (*)    All other components within normal limits  D-DIMER, QUANTITATIVE  POCT PREGNANCY, URINE  POCT I-STAT TROPONIN I   Imaging Review Dg Chest 2  View  04/01/2013   CLINICAL DATA:  Mid chest pain  EXAM: CHEST  2 VIEW  COMPARISON:  None.  FINDINGS: The heart size and mediastinal contours are within normal limits. Both lungs are clear. The visualized skeletal structures are unremarkable.  IMPRESSION: No active cardiopulmonary disease.   Electronically Signed   By: Ruel Favors M.D.   On: 04/01/2013 20:19    EKG Interpretation   None       MDM   1. Chest pain     Patient with left-sided chest pain, left arm numbness, tongue tingling. She is currently symptom-free. She is in no acute distress. She sitting in the bed, smiling, laughing. Her lab work is back unremarkable. Her chest x-ray is negative. EKG is normal. She is PERC negative. At this time I do not think she has coronary artery disease or a PE. She does have some anxiety and this could be related to that or a panic attack. I discussed patient's symptoms and the results of all the tests here and instructed her to followup closely with her doctor her symptoms continued. She was also are instructed to return if her symptoms are worsening. Patient voiced understanding. She is comfortable going home.  Filed Vitals:   04/01/13 1639 04/01/13 1915 04/01/13 1917  BP: 153/79 124/70 124/70  Pulse: 62 78 74  Temp: 98.4 F (36.9 C)    TempSrc: Oral    Resp: 16 20   Height:   5\' 2"  (1.575 m)  Weight: 212 lb 9.6 oz (96.435 kg)  220 lb (99.791 kg)  SpO2: 98% 99% 100%       Nima Bamburg A Anniya Whiters, PA-C 04/02/13 0000

## 2013-04-01 NOTE — ED Notes (Signed)
MD at Bedside.

## 2013-04-02 NOTE — ED Provider Notes (Signed)
Medical screening examination/treatment/procedure(s) were performed by non-physician practitioner and as supervising physician I was immediately available for consultation/collaboration.  EKG Interpretation    Date/Time:  Tuesday April 01 2013 16:37:11 EST Ventricular Rate:  73 PR Interval:  150 QRS Duration: 82 QT Interval:  404 QTC Calculation: 445 R Axis:   64 Text Interpretation:  Normal sinus rhythm with sinus arrhythmia Normal ECG Confirmed by Malva Cogan  MD, Burnard Enis ([id number]) on 04/02/2013 12:02:18 AM             Geoffery Lyons, MD 04/02/13 0002

## 2013-07-22 ENCOUNTER — Encounter (HOSPITAL_COMMUNITY): Payer: Self-pay | Admitting: Emergency Medicine

## 2013-07-22 ENCOUNTER — Emergency Department (HOSPITAL_COMMUNITY)
Admission: EM | Admit: 2013-07-22 | Discharge: 2013-07-22 | Disposition: A | Discharge status: Home / Self Care | Payer: Self-pay | Attending: Emergency Medicine | Admitting: Emergency Medicine

## 2013-07-22 DIAGNOSIS — R358 Other polyuria: Secondary | ICD-10-CM | POA: Insufficient documentation

## 2013-07-22 DIAGNOSIS — E119 Type 2 diabetes mellitus without complications: Secondary | ICD-10-CM | POA: Insufficient documentation

## 2013-07-22 DIAGNOSIS — R3589 Other polyuria: Secondary | ICD-10-CM | POA: Insufficient documentation

## 2013-07-22 DIAGNOSIS — R42 Dizziness and giddiness: Secondary | ICD-10-CM | POA: Insufficient documentation

## 2013-07-22 DIAGNOSIS — J45909 Unspecified asthma, uncomplicated: Secondary | ICD-10-CM | POA: Insufficient documentation

## 2013-07-22 DIAGNOSIS — Z79899 Other long term (current) drug therapy: Secondary | ICD-10-CM | POA: Insufficient documentation

## 2013-07-22 DIAGNOSIS — R631 Polydipsia: Secondary | ICD-10-CM | POA: Insufficient documentation

## 2013-07-22 DIAGNOSIS — R739 Hyperglycemia, unspecified: Secondary | ICD-10-CM

## 2013-07-22 DIAGNOSIS — Z3202 Encounter for pregnancy test, result negative: Secondary | ICD-10-CM | POA: Insufficient documentation

## 2013-07-22 DIAGNOSIS — E669 Obesity, unspecified: Secondary | ICD-10-CM | POA: Insufficient documentation

## 2013-07-22 LAB — BASIC METABOLIC PANEL
BUN: 12 mg/dL (ref 6–23)
CALCIUM: 9.7 mg/dL (ref 8.4–10.5)
CO2: 23 mEq/L (ref 19–32)
Chloride: 100 mEq/L (ref 96–112)
Creatinine, Ser: 0.76 mg/dL (ref 0.50–1.10)
GFR calc non Af Amer: >90 mL/min (ref >90)
GLUCOSE: 408 mg/dL — AB (ref 70–99)
Potassium: 4.1 mEq/L (ref 3.7–5.3)
SODIUM: 138 meq/L (ref 137–147)

## 2013-07-22 LAB — CBC WITH DIFFERENTIAL/PLATELET
Basophils Absolute: 0 10*3/uL (ref 0.0–0.1)
Basophils Relative: 0 % (ref 0–1)
EOS PCT: 4 % (ref 0–5)
Eosinophils Absolute: 0.3 10*3/uL (ref 0.0–0.7)
HCT: 37.8 % (ref 36.0–46.0)
Hemoglobin: 12.7 g/dL (ref 12.0–15.0)
LYMPHS ABS: 2.1 10*3/uL (ref 0.7–4.0)
Lymphocytes Relative: 31 % (ref 12–46)
MCH: 24.9 pg — AB (ref 26.0–34.0)
MCHC: 33.6 g/dL (ref 30.0–36.0)
MCV: 74 fL — ABNORMAL LOW (ref 78.0–100.0)
MONOS PCT: 5 % (ref 3–12)
Monocytes Absolute: 0.4 10*3/uL (ref 0.1–1.0)
Neutro Abs: 4 10*3/uL (ref 1.7–7.7)
Neutrophils Relative %: 59 % (ref 43–77)
PLATELETS: 194 10*3/uL (ref 150–400)
RBC: 5.11 MIL/uL (ref 3.87–5.11)
RDW: 13.7 % (ref 11.5–15.5)
WBC: 6.8 10*3/uL (ref 4.0–10.5)

## 2013-07-22 LAB — URINALYSIS, ROUTINE W REFLEX MICROSCOPIC
Bilirubin Urine: NEGATIVE
Glucose, UA: >1000 mg/dL — AB
HGB URINE DIPSTICK: NEGATIVE
Ketones, ur: NEGATIVE mg/dL
Leukocytes, UA: NEGATIVE
Nitrite: NEGATIVE
PROTEIN: NEGATIVE mg/dL
Specific Gravity, Urine: 1.03 (ref 1.005–1.030)
Urobilinogen, UA: 0.2 mg/dL (ref 0.0–1.0)
pH: 5 (ref 5.0–8.0)

## 2013-07-22 LAB — URINE MICROSCOPIC-ADD ON

## 2013-07-22 LAB — POC URINE PREG, ED: Preg Test, Ur: NEGATIVE

## 2013-07-22 LAB — CBG MONITORING, ED
GLUCOSE-CAPILLARY: 310 mg/dL — AB (ref 70–99)
Glucose-Capillary: 391 mg/dL — ABNORMAL HIGH (ref 70–99)

## 2013-07-22 MED ORDER — METFORMIN HCL 500 MG PO TABS: 500.0000 mg | ORAL_TABLET | Freq: Two times a day (BID) | ORAL | Status: DC

## 2013-07-22 MED ORDER — SODIUM CHLORIDE 0.9 % IV BOLUS (SEPSIS)
1000.0000 mL | Freq: Once | INTRAVENOUS | Status: AC
  Administered 2013-07-22: 1000 mL via INTRAVENOUS

## 2013-07-22 NOTE — Progress Notes (Signed)
[facility] regarding patient needing f/u for diabetes. [facility] with numbness and hyperglycemia CBG-408 mg/dl in ED In room to meet with patient, she states, that she does not have insurance. Pt  repots not having access to f/u care with PCP. Discussed with patient importance and benefits of f/u with PCP, and not utilizing the ED for primary care needs. Pt verbalized understanding and is in agreement. Discussed [facility]  Liaison Sunny SchleinFelicia is onsite and will come and speak with patient regarding [facility] will provide her with information and [facility]. Pt is appreciative and agreeable to plan. Provided patient with list of Affordable Primary Care practices. [facility]. Offered to schedule appt, pt provided consent to schedule appt for establishing care for f/u. Appt scheduled for 3/24 at 930. [facility] to fill prescription today for Glucometer, strips, and Metformin. [facility] notified.  Pt verbalized understanding.  No further ED CM needs identified.

## 2013-07-22 NOTE — Discharge Instructions (Signed)
Hyperglycemia  You were evaluated today and found to have high blood sugar. This is likely a new diagnosis of diabetes. He will be given an oral diabetes medication. He need to have a low carbohydrate diet and monitor your blood sugars prior to breakfast, lunch, and dinner. Please write these numbers down. If you have any blood glucoses greater than 400 or critically high, you need to [facility] or report to the ER for further management. He will be given prescriptions for glucometer, test strips, and lancets.    Hyperglycemia occurs when the glucose (sugar) in your blood is too high. Hyperglycemia can happen for many reasons, but it most often happens to people who do not know they have diabetes or are not managing their diabetes properly.  CAUSES  Whether you have diabetes or not, there are other causes of hyperglycemia. Hyperglycemia can occur when you have diabetes, but it can also occur in other situations that you might not be as aware of, such as: Diabetes  If you have diabetes and are having problems controlling your blood glucose, hyperglycemia could occur because of some of the following reasons:  Not following your meal plan.  Not taking your diabetes medications or not taking it properly.  Exercising less or doing less activity than you normally do.  Being sick. Pre-diabetes  This cannot be ignored. Before people develop Type 2 diabetes, they almost always have "pre-diabetes." This is when your blood glucose levels are higher than normal, but not yet high enough to be diagnosed as diabetes. Research has shown that some long-term damage to the body, especially the heart and circulatory system, may already be occurring during pre-diabetes. If you take action to manage your blood glucose when you have pre-diabetes, you may delay or prevent Type 2 diabetes from developing. Stress  If you have diabetes, you may be "diet" controlled or on oral medications or insulin to  control your diabetes. However, you may find that your blood glucose is higher than usual in the hospital whether you have diabetes or not. This is often referred to as "stress hyperglycemia." Stress can elevate your blood glucose. This happens because of hormones put out by the body during times of stress. If stress has been the cause of your high blood glucose, it can be followed regularly by your caregiver. That way he/she can make sure your hyperglycemia does not continue to get worse or progress to diabetes. Steroids  Steroids are medications that act on the infection fighting system (immune system) to block inflammation or infection. One side effect can be a rise in blood glucose. Most people can produce enough extra insulin to allow for this rise, but for those who cannot, steroids make blood glucose levels go even higher. It is not unusual for steroid treatments to "uncover" diabetes that is developing. It is not always possible to determine if the hyperglycemia will go away after the steroids are stopped. A special blood test called an A1c is sometimes done to determine if your blood glucose was elevated before the steroids were started. SYMPTOMS  Thirsty.  Frequent urination.  Dry mouth.  Blurred vision.  Tired or fatigue.  Weakness.  Sleepy.  Tingling in feet or leg. DIAGNOSIS  Diagnosis is made by monitoring blood glucose in one or all of the following ways:  A1c test. This is a chemical found in your blood.  Fingerstick blood glucose monitoring.  Laboratory results. TREATMENT  First, knowing the cause of the hyperglycemia is important  before the hyperglycemia can be treated. Treatment may include, but is not be limited to:  Education.  Change or adjustment in medications.  Change or adjustment in meal plan.  Treatment for an illness, infection, etc.  More frequent blood glucose monitoring.  Change in exercise plan.  Decreasing or stopping  steroids.  Lifestyle changes. HOME CARE INSTRUCTIONS   Test your blood glucose as directed.  Exercise regularly. Your caregiver will give you instructions about exercise. Pre-diabetes or diabetes which comes on with stress is helped by exercising.  Eat wholesome, balanced meals. Eat often and at regular, fixed times. Your caregiver or nutritionist will give you a meal plan to guide your sugar intake.  Being at an ideal weight is important. If needed, losing as little as 10 to 15 pounds may help improve blood glucose levels. SEEK MEDICAL CARE IF:   You have questions about medicine, activity, or diet.  You continue to have symptoms (problems such as increased thirst, urination, or weight gain). SEEK IMMEDIATE MEDICAL CARE IF:   You are vomiting or have diarrhea.  Your breath smells fruity.  You are breathing faster or slower.  You are very sleepy or incoherent.  You have numbness, tingling, or pain in your feet or hands.  You have chest pain.  Your symptoms get worse even though you have been following your caregiver's orders.  If you have any other questions or concerns. Document Released: 10/18/2000 Document Revised: 07/17/2011 Document Reviewed: 08/21/2011 [facility] Patient Information 2014 [place], [place].

## 2013-07-22 NOTE — ED Notes (Signed)
cbg of 391 reported to [place], [place]

## 2013-07-22 NOTE — ED Provider Notes (Signed)
CSN: [id number]     Arrival date & time 07/22/13  0705 History   First MD Initiated Contact with Patient 07/22/13 [phone]     Chief Complaint  Patient presents with  . Numbness     (Consider location/radiation/quality/duration/timing/severity/associated sxs/prior Treatment) HPI  This is a 28 year old female with a history of asthma and obesity who presents with 4-5 day history of lightheadedness, polydipsia, polyuria, and dry mouth. Patient also reports that she woke up this morning with numbness in her left hand. Currently she is only having numbness in her left pinky but states that it was in all of her fingers prior to arrival. Patient denies any pain in her wrist or elbow. Never had symptoms like this before. Patient reports lightheadedness that is nonradiating. It is not appear to be positional. She states that she has had increased thirst and urination as well as dry mouth.  She denies any recent fevers, cough, vomiting or diarrhea. She denies any chest pain or shortness of breath.  Past Medical History  Diagnosis Date  . Asthma   . Obesity    Past Surgical History  Procedure Laterality Date  . Tubal ligation    . Cesarean section     History reviewed. No pertinent family history. History  Substance Use Topics  . Smoking status: Never Smoker   . Smokeless tobacco: Not on file  . Alcohol Use: No   OB History   Grav Para Term Preterm Abortions TAB SAB Ect Mult Living                 Review of Systems  Constitutional: Negative for fever.  Respiratory: Negative for cough, chest tightness and shortness of breath.   Cardiovascular: Negative for chest pain.  Gastrointestinal: Negative for nausea, vomiting and abdominal pain.  Endocrine: Positive for polydipsia and polyuria.  Genitourinary: Negative for dysuria.  Musculoskeletal: Negative for back pain.  Skin: Negative for rash.  Neurological: Positive for dizziness and numbness. Negative for weakness and headaches.   Psychiatric/Behavioral: Negative for confusion.  All other systems reviewed and are negative.      Allergies  Review of patient's allergies indicates no known allergies.  Home Medications   Current Outpatient Rx  Name  Route  Sig  Dispense  Refill  . metFORMIN (GLUCOPHAGE) 500 MG tablet   Oral   Take 1 tablet (500 mg total) by mouth 2 (two) times daily with a meal.   60 tablet   0    BP 125/64  Pulse 64  Temp(Src) 98.6 F (37 C) (Oral)  Ht 5\' 2"  (1.575 m)  Wt 198 lb (89.812 kg)  BMI 36.21 kg/m2  SpO2 100%  LMP 07/09/2013 Physical Exam  Nursing note and vitals reviewed. Constitutional: She is oriented to person, place, and time. She appears well-developed and well-nourished.  Obese  HENT:  Head: Normocephalic and atraumatic.  Eyes: Pupils are equal, round, and reactive to light.  Neck: Neck supple.  Cardiovascular: Normal rate, regular rhythm and normal heart sounds.   No murmur heard. Pulmonary/Chest: Effort normal and breath sounds normal. No respiratory distress. She has no wheezes.  Abdominal: Soft. Bowel sounds are normal. There is no tenderness. There is no rebound.  Musculoskeletal:  No tenderness to palpation over the median nerve canal the left breast, radius, ulnar, and medial nerve motor and sensory distributions intact,   Neurological: She is alert and oriented to person, place, and time.  5 out of 5 grip strength bilaterally  Skin: Skin is warm  and dry.  Psychiatric: She has a normal mood and affect.    ED Course  Procedures (including critical care time) Labs Review Labs Reviewed  CBC WITH DIFFERENTIAL - Abnormal; Notable for the following:    MCV 74.0 (*)    MCH 24.9 (*)    All other components within normal limits  BASIC METABOLIC PANEL - Abnormal; Notable for the following:    Glucose, Bld 408 (*)    All other components within normal limits  URINALYSIS, ROUTINE W REFLEX MICROSCOPIC - Abnormal; Notable for the following:    APPearance  HAZY (*)    Glucose, UA >1000 (*)    All other components within normal limits  URINE MICROSCOPIC-ADD ON - Abnormal; Notable for the following:    Squamous Epithelial / LPF FEW (*)    Bacteria, UA FEW (*)    All other components within normal limits  CBG MONITORING, ED - Abnormal; Notable for the following:    Glucose-Capillary 391 (*)    All other components within normal limits  CBG MONITORING, ED - Abnormal; Notable for the following:    Glucose-Capillary 310 (*)    All other components within normal limits  POC URINE PREG, ED   Imaging Review No results found.   EKG Interpretation   Date/Time:  Tuesday July 22 2013 07:49:47 EDT Ventricular Rate:  65 PR Interval:  153 QRS Duration: 70 QT Interval:  400 QTC Calculation: 416 R Axis:   51 Text Interpretation:  Sinus rhythm ST elev, probable normal early repol  pattern T wave inversion lead III new Confirmed by Zymere Patlan  MD, Giannah Zavadil  ([id number]) on 07/22/2013 8:21:59 AM      MDM   Final diagnoses:  Hyperglycemia  Diabetes    Patient presents with 3 to four-day history of polydipsia, polyuria, dizziness and now has left pinky numbness. She is nontoxic on exam. She is obese. History concerning for possible new onset diabetes.  No objective neurologic deficit on exam  9:28 AM CBG noted to be 391.  BMP with a glucose of 408. No evidence of anion gap. Patient was given fluids. Workup is otherwise unremarkable. Concerning for new-onset diabetes. Case management has seen the patient to set up outpatient followup. Patient will be started on metformin. She was given a normal saline bolus and repeat CBG will be obtained.  Repeat CBG 310.  Patient requesting dc.  Will follow-up in 1 week. Started Metformin BID.  Patient Rx'd glucometer and strips.  INstructed to monitor blood sugar TID and write down sugars.  Will return for glucose >400.   Patient stated understanding.  After history, exam, and medical workup I feel the patient has  been appropriately medically screened and is safe for discharge home. Pertinent diagnoses were discussed with the patient. Patient was given return precautions.     Shon Batonourtney F Bradford Cazier, MD 07/22/13 [phone]

## 2013-07-22 NOTE — ED Notes (Signed)
Pt from home. States she has be light headed with a dry mouth x 4 days. Woke up this am left hand pinky numbness at apx 0500.

## 2013-07-29 ENCOUNTER — Ambulatory Visit: Payer: Medicaid Other

## 2013-10-07 ENCOUNTER — Emergency Department (HOSPITAL_COMMUNITY): Payer: Self-pay

## 2013-10-07 ENCOUNTER — Emergency Department (HOSPITAL_COMMUNITY)
Admission: EM | Admit: 2013-10-07 | Discharge: 2013-10-07 | Disposition: A | Discharge status: Home / Self Care | Payer: Self-pay | Attending: Emergency Medicine | Admitting: Emergency Medicine

## 2013-10-07 ENCOUNTER — Encounter (HOSPITAL_COMMUNITY): Payer: Self-pay | Admitting: Emergency Medicine

## 2013-10-07 DIAGNOSIS — Z79899 Other long term (current) drug therapy: Secondary | ICD-10-CM | POA: Insufficient documentation

## 2013-10-07 DIAGNOSIS — E119 Type 2 diabetes mellitus without complications: Secondary | ICD-10-CM | POA: Insufficient documentation

## 2013-10-07 DIAGNOSIS — E669 Obesity, unspecified: Secondary | ICD-10-CM | POA: Insufficient documentation

## 2013-10-07 DIAGNOSIS — J159 Unspecified bacterial pneumonia: Secondary | ICD-10-CM | POA: Insufficient documentation

## 2013-10-07 DIAGNOSIS — J189 Pneumonia, unspecified organism: Secondary | ICD-10-CM

## 2013-10-07 MED ORDER — AZITHROMYCIN 250 MG PO TABS: 250.0000 mg | ORAL_TABLET | Freq: Every day | ORAL | Status: DC

## 2013-10-07 MED ORDER — AZITHROMYCIN 250 MG PO TABS
500.0000 mg | ORAL_TABLET | Freq: Once | ORAL | Status: AC
  Administered 2013-10-07: 500 mg via ORAL
  Filled 2013-10-07: qty 2

## 2013-10-07 NOTE — Discharge Instructions (Signed)
Pneumonia, Adult Use your albuterol inhaler 2 puffs every 4 hours as needed for cough or shortness of breath. Return if needed more than every 4 hours. Start taking the antibiotic prescribed tomorrow morning. Keep your scheduled appointment at the [facility] on 10/10/2013. Pneumonia is an infection of the lungs.  CAUSES Pneumonia may be caused by bacteria or a virus. Usually, these infections are caused by breathing infectious particles into the lungs (respiratory tract). SYMPTOMS   Cough.  Fever.  Chest pain.  Increased rate of breathing.  Wheezing.  Mucus production. DIAGNOSIS  If you have the common symptoms of pneumonia, your caregiver will typically confirm the diagnosis with a chest X-ray. The X-ray will show an abnormality in the lung (pulmonary infiltrate) if you have pneumonia. Other tests of your blood, urine, or sputum may be done to find the specific cause of your pneumonia. Your caregiver may also do tests (blood gases or pulse oximetry) to see how well your lungs are working. TREATMENT  Some forms of pneumonia may be spread to other people when you cough or sneeze. You may be asked to wear a mask before and during your exam. Pneumonia that is caused by bacteria is treated with antibiotic medicine. Pneumonia that is caused by the influenza virus may be treated with an antiviral medicine. Most other viral infections must run their course. These infections will not respond to antibiotics.  PREVENTION A pneumococcal shot (vaccine) is available to prevent a common bacterial cause of pneumonia. This is usually suggested for:  People over 51 years old.  Patients on chemotherapy.  People with chronic lung problems, such as bronchitis or emphysema.  People with immune system problems. If you are over 65 or have a high risk condition, you may receive the pneumococcal vaccine if you have not received it before. In some countries, a routine influenza vaccine is also  recommended. This vaccine can help prevent some cases of pneumonia.You may be offered the influenza vaccine as part of your care. If you smoke, it is time to quit. You may receive instructions on how to stop smoking. Your caregiver can provide medicines and counseling to help you quit. HOME CARE INSTRUCTIONS   Cough suppressants may be used if you are losing too much rest. However, coughing protects you by clearing your lungs. You should avoid using cough suppressants if you can.  Your caregiver may have prescribed medicine if he or she thinks your pneumonia is caused by a bacteria or influenza. Finish your medicine even if you start to feel better.  Your caregiver may also prescribe an expectorant. This loosens the mucus to be coughed up.  Only take over-the-counter or prescription medicines for pain, discomfort, or fever as directed by your caregiver.  Do not smoke. Smoking is a common cause of bronchitis and can contribute to pneumonia. If you are a smoker and continue to smoke, your cough may last several weeks after your pneumonia has cleared.  A cold steam vaporizer or humidifier in your room or home may help loosen mucus.  Coughing is often worse at night. Sleeping in a semi-upright position in a recliner or using a couple pillows under your head will help with this.  Get rest as you feel it is needed. Your body will usually let you know when you need to rest. SEEK IMMEDIATE MEDICAL CARE IF:   Your illness becomes worse. This is especially true if you are elderly or weakened from any other disease.  You cannot control your cough  with suppressants and are losing sleep.  You begin coughing up blood.  You develop pain which is getting worse or is uncontrolled with medicines.  You have a fever.  Any of the symptoms which initially brought you in for treatment are getting worse rather than better.  You develop shortness of breath or chest pain. MAKE SURE YOU:   Understand these  instructions.  Will watch your condition.  Will get help right away if you are not doing well or get worse. Document Released: 04/24/2005 Document Revised: 07/17/2011 Document Reviewed: 07/14/2010 [facility] Patient Information 2014 [place], [place].

## 2013-10-07 NOTE — ED Notes (Signed)
Patient reports she has had a cough for the past week, and asthma attack started last night. She reports she used her inhaler, and now has improved breathing.  Alert and oriented.

## 2013-10-07 NOTE — ED Notes (Signed)
Patient does not request breathing treatment at this time. Dr. Ethelda Chick aware.

## 2013-10-07 NOTE — ED Notes (Signed)
Dr. Jacubowitz at the bedside 

## 2013-10-07 NOTE — ED Provider Notes (Signed)
CSN: [id number]     Arrival date & time 10/07/13  4098 History   First MD Initiated Contact with Patient 10/07/13 [phone]     Chief Complaint  Patient presents with  . Asthma     (Consider location/radiation/quality/duration/timing/severity/associated sxs/prior Treatment) HPI Complains of cough sneeze and "I have a cold" one to 2 weeks ago. Denies fever. She's been using her albuterol inhaler with relief she's used it 3 times in the past 24 hours. Her breathing is normal at present. No other associated symptoms. Nothing makes symptoms better or worse. She is presently asymptomatic except for feeling of congestion in her chest and in her nose. Past Medical History  Diagnosis Date  . Asthma   . Obesity    diabetes Past Surgical History  Procedure Laterality Date  . Tubal ligation    . Cesarean section     No family history on file. History  Substance Use Topics  . Smoking status: Never Smoker   . Smokeless tobacco: Not on file  . Alcohol Use: No   OB History   Grav Para Term Preterm Abortions TAB SAB Ect Mult Living                 Review of Systems  Constitutional: Negative.   HENT: Positive for congestion.   Respiratory: Positive for cough. Negative for shortness of breath.   Cardiovascular: Negative.   Gastrointestinal: Negative.   Musculoskeletal: Negative.   Skin: Negative.   Neurological: Negative.   Psychiatric/Behavioral: Negative.   All other systems reviewed and are negative.     Allergies  Review of patient's allergies indicates no known allergies.  Home Medications   Prior to Admission medications   Medication Sig Start Date End Date Taking? Authorizing Provider  albuterol (PROVENTIL HFA;VENTOLIN HFA) 108 (90 BASE) MCG/ACT inhaler Inhale 2 puffs into the lungs every 6 (six) hours as needed for wheezing or shortness of breath.   Yes Historical Provider, MD  metFORMIN (GLUCOPHAGE) 500 MG tablet Take 1 tablet (500 mg total) by mouth 2 (two) times daily with  a meal. 07/22/13  Yes Shon Baton, MD   BP 136/76  Pulse 72  Temp(Src) 98.4 F (36.9 C) (Oral)  Resp 16  Ht 5\' 2"  (1.575 m)  Wt 195 lb (88.451 kg)  BMI 35.66 kg/m2  SpO2 100% Physical Exam  Nursing note and vitals reviewed. Constitutional: She appears well-developed and well-nourished.  HENT:  Head: Normocephalic and atraumatic.  Eyes: Conjunctivae are normal. Pupils are equal, round, and reactive to light.  Neck: Neck supple. No tracheal deviation present. No thyromegaly present.  Cardiovascular: Normal rate and regular rhythm.   No murmur heard. Pulmonary/Chest: Effort normal and breath sounds normal.  Abdominal: Soft. Bowel sounds are normal. She exhibits no distension. There is no tenderness.  Obese  Musculoskeletal: Normal range of motion. She exhibits no edema and no tenderness.  Neurological: She is alert. Coordination normal.  Skin: Skin is warm and dry. No rash noted.  Psychiatric: She has a normal mood and affect.    ED Course  Procedures (including critical care time) Labs Review Labs Reviewed - No data to display  Imaging Review No results found.   EKG Interpretation   Date/Time:  Tuesday October 07 2013 07:01:39 EDT Ventricular Rate:  81 PR Interval:  162 QRS Duration: 70 QT Interval:  390 QTC Calculation: 453 R Axis:   61 Text Interpretation:  Age not entered, assumed to be  28 years old for  purpose of  ECG interpretation Sinus rhythm ST elev, probable normal early  repol pattern Baseline wander in lead(s) II III aVR aVF No significant  change since last tracing Confirmed by Ethelda Chick  MD, Cordera Stineman [phone]) on  10/07/2013 7:14:48 AM     Chest x-ray viewed by me Results for orders placed during the hospital encounter of 07/22/13  CBC WITH DIFFERENTIAL      Result Value Ref Range   WBC 6.8  4.0 - 10.5 K/uL   RBC 5.11  3.87 - 5.11 MIL/uL   Hemoglobin 12.7  12.0 - 15.0 g/dL   HCT 84.5  36.4 - 68.0 %   MCV 74.0 (*) 78.0 - 100.0 fL   MCH 24.9 (*) 26.0  - 34.0 pg   MCHC 33.6  30.0 - 36.0 g/dL   RDW 32.1  22.4 - 82.5 %   Platelets 194  150 - 400 K/uL   Neutrophils Relative % 59  43 - 77 %   Neutro Abs 4.0  1.7 - 7.7 K/uL   Lymphocytes Relative 31  12 - 46 %   Lymphs Abs 2.1  0.7 - 4.0 K/uL   Monocytes Relative 5  3 - 12 %   Monocytes Absolute 0.4  0.1 - 1.0 K/uL   Eosinophils Relative 4  0 - 5 %   Eosinophils Absolute 0.3  0.0 - 0.7 K/uL   Basophils Relative 0  0 - 1 %   Basophils Absolute 0.0  0.0 - 0.1 K/uL  BASIC METABOLIC PANEL      Result Value Ref Range   Sodium 138  137 - 147 mEq/L   Potassium 4.1  3.7 - 5.3 mEq/L   Chloride 100  96 - 112 mEq/L   CO2 23  19 - 32 mEq/L   Glucose, Bld 408 (*) 70 - 99 mg/dL   BUN 12  6 - 23 mg/dL   Creatinine, Ser 0.03  0.50 - 1.10 mg/dL   Calcium 9.7  8.4 - 70.4 mg/dL   GFR calc non Af Amer >90  >90 mL/min   GFR calc Af Amer >90  >90 mL/min  URINALYSIS, ROUTINE W REFLEX MICROSCOPIC      Result Value Ref Range   Color, Urine YELLOW  YELLOW   APPearance HAZY (*) CLEAR   Specific Gravity, Urine 1.030  1.005 - 1.030   pH 5.0  5.0 - 8.0   Glucose, UA >1000 (*) NEGATIVE mg/dL   Hgb urine dipstick NEGATIVE  NEGATIVE   Bilirubin Urine NEGATIVE  NEGATIVE   Ketones, ur NEGATIVE  NEGATIVE mg/dL   Protein, ur NEGATIVE  NEGATIVE mg/dL   Urobilinogen, UA 0.2  0.0 - 1.0 mg/dL   Nitrite NEGATIVE  NEGATIVE   Leukocytes, UA NEGATIVE  NEGATIVE  URINE MICROSCOPIC-ADD ON      Result Value Ref Range   Squamous Epithelial / LPF FEW (*) RARE   Bacteria, UA FEW (*) RARE  CBG MONITORING, ED      Result Value Ref Range   Glucose-Capillary 391 (*) 70 - 99 mg/dL   Comment 1 Documented in Chart     Comment 2 Notify RN    POC URINE PREG, ED      Result Value Ref Range   Preg Test, Ur NEGATIVE  NEGATIVE  CBG MONITORING, ED      Result Value Ref Range   Glucose-Capillary 310 (*) 70 - 99 mg/dL   Dg Chest 2 View  12/14/8914   CLINICAL DATA:  Cough and congestion  EXAM:  CHEST  2 VIEW  COMPARISON:  04/01/2013   FINDINGS: Cardiac shadow is within normal limits. The lungs are well aerated bilaterally. Mild left suprahilar infiltrate is seen. No other focal abnormality is noted.  IMPRESSION: Mild left suprahilar infiltrate.   Electronically Signed   By: Alcide CleverMark  Lukens M.D.   On: 10/07/2013 07:37    MDM   Final diagnoses:  None   Lipase is cough we'll treat with antibiotic. Pneumonia extremely mild patient has normal respiratory, normal pulse ox infiltrate is subtle. Port score 17 Plan prescription Zithromax. She is instructed to use albuterol inhaler 2 puffs every 4 hours as needed for cough or shortness of breath return if needed more than every 4 hours. Keep scheduled appointment with, while the Center 10/10/2013 Diagnosis community-acquired pneumonia     Doug SouSam Evan Mackie, MD 10/07/13 [phone]

## 2013-10-08 NOTE — Discharge Planning (Signed)
[facility] not able to see patient, [facility] orange card information and resources will be sent to the address listed. ° °

## 2013-10-16 ENCOUNTER — Encounter: Payer: Self-pay | Admitting: Internal Medicine

## 2013-10-16 ENCOUNTER — Ambulatory Visit: Payer: Self-pay | Attending: Internal Medicine | Admitting: Internal Medicine

## 2013-10-16 VITALS — BP 132/78 | HR 84 | Temp 98.9°F | Resp 16 | Ht 62.0 in | Wt 200.0 lb

## 2013-10-16 DIAGNOSIS — Z713 Dietary counseling and surveillance: Secondary | ICD-10-CM | POA: Insufficient documentation

## 2013-10-16 DIAGNOSIS — E119 Type 2 diabetes mellitus without complications: Secondary | ICD-10-CM | POA: Insufficient documentation

## 2013-10-16 DIAGNOSIS — Z6836 Body mass index (BMI) 36.0-36.9, adult: Secondary | ICD-10-CM | POA: Insufficient documentation

## 2013-10-16 DIAGNOSIS — E669 Obesity, unspecified: Secondary | ICD-10-CM | POA: Insufficient documentation

## 2013-10-16 DIAGNOSIS — J45909 Unspecified asthma, uncomplicated: Secondary | ICD-10-CM | POA: Insufficient documentation

## 2013-10-16 LAB — CBC WITH DIFFERENTIAL/PLATELET
BASOS ABS: 0 10*3/uL (ref 0.0–0.1)
BASOS PCT: 0 % (ref 0–1)
Eosinophils Absolute: 0.2 10*3/uL (ref 0.0–0.7)
Eosinophils Relative: 3 % (ref 0–5)
HEMATOCRIT: 36.6 % (ref 36.0–46.0)
Hemoglobin: 11.7 g/dL — ABNORMAL LOW (ref 12.0–15.0)
Lymphocytes Relative: 30 % (ref 12–46)
Lymphs Abs: 2.1 10*3/uL (ref 0.7–4.0)
MCH: 23.6 pg — ABNORMAL LOW (ref 26.0–34.0)
MCHC: 32 g/dL (ref 30.0–36.0)
MCV: 73.9 fL — ABNORMAL LOW (ref 78.0–100.0)
MONO ABS: 0.4 10*3/uL (ref 0.1–1.0)
Monocytes Relative: 5 % (ref 3–12)
NEUTROS ABS: 4.3 10*3/uL (ref 1.7–7.7)
Neutrophils Relative %: 62 % (ref 43–77)
PLATELETS: 259 10*3/uL (ref 150–400)
RBC: 4.95 MIL/uL (ref 3.87–5.11)
RDW: 15.4 % (ref 11.5–15.5)
WBC: 7 10*3/uL (ref 4.0–10.5)

## 2013-10-16 LAB — GLUCOSE, POCT (MANUAL RESULT ENTRY): POC Glucose: 155 mg/dl — AB (ref 70–99)

## 2013-10-16 LAB — POCT GLYCOSYLATED HEMOGLOBIN (HGB A1C): Hemoglobin A1C: 6

## 2013-10-16 MED ORDER — METFORMIN HCL 500 MG PO TABS: 500.0000 mg | ORAL_TABLET | Freq: Two times a day (BID) | ORAL | Status: DC

## 2013-10-16 NOTE — Progress Notes (Signed)
Pt is here to establish care. Pt has a history of diabetes. Pt has no C.C. Today.

## 2013-10-16 NOTE — Patient Instructions (Signed)
Diabetes and Exercise Exercising regularly is important. It is not just about losing weight. It has many health benefits, such as:  Improving your overall fitness, flexibility, and endurance.  Increasing your bone density.  Helping with weight control.  Decreasing your body fat.  Increasing your muscle strength.  Reducing stress and tension.  Improving your overall health. People with diabetes who exercise gain additional benefits because exercise:  Reduces appetite.  Improves the body's use of blood sugar (glucose).  Helps lower or control blood glucose.  Decreases blood pressure.  Helps control blood lipids (such as cholesterol and triglycerides).  Improves the body's use of the hormone insulin by:  Increasing the body's insulin sensitivity.  Reducing the body's insulin needs.  Decreases the risk for heart disease because exercising:  Lowers cholesterol and triglycerides levels.  Increases the levels of good cholesterol (such as high-density lipoproteins [HDL]) in the body.  Lowers blood glucose levels. YOUR ACTIVITY PLAN  Choose an activity that you enjoy and set realistic goals. Your health care provider or diabetes educator can help you make an activity plan that works for you. You can break activities into 2 or 3 sessions throughout the day. Doing so is as good as one long session. Exercise ideas include:  Taking the dog for a walk.  Taking the stairs instead of the elevator.  Dancing to your favorite song.  Doing your favorite exercise with a friend. RECOMMENDATIONS FOR EXERCISING WITH TYPE 1 OR TYPE 2 DIABETES   Check your blood glucose before exercising. If blood glucose levels are greater than 240 mg/dL, check for urine ketones. Do not exercise if ketones are present.  Avoid injecting insulin into areas of the body that are going to be exercised. For example, avoid injecting insulin into:  The arms when playing tennis.  The legs when  jogging.  Keep a record of:  Food intake before and after you exercise.  Expected peak times of insulin action.  Blood glucose levels before and after you exercise.  The type and amount of exercise you have done.  Review your records with your health care provider. Your health care provider will help you to develop guidelines for adjusting food intake and insulin amounts before and after exercising.  If you take insulin or oral hypoglycemic agents, watch for signs and symptoms of hypoglycemia. They include:  Dizziness.  Shaking.  Sweating.  Chills.  Confusion.  Drink plenty of water while you exercise to prevent dehydration or heat stroke. Body water is lost during exercise and must be replaced.  Talk to your health care provider before starting an exercise program to make sure it is safe for you. Remember, almost any type of activity is better than none. Document Released: 07/15/2003 Document Revised: 12/25/2012 Document Reviewed: 10/01/2012 ExitCare Patient Information 2014 [employer]. Diabetes Meal Planning Guide The diabetes meal planning guide is a tool to help you plan your meals and snacks. It is important for people with diabetes to manage their blood glucose (sugar) levels. Choosing the right foods and the right amounts throughout your day will help control your blood glucose. Eating right can even help you improve your blood pressure and reach or maintain a healthy weight. CARBOHYDRATE COUNTING MADE EASY When you eat carbohydrates, they turn to sugar. This raises your blood glucose level. Counting carbohydrates can help you control this level so you feel better. When you plan your meals by counting carbohydrates, you can have more flexibility in what you eat and balance your medicine   with your food intake. Carbohydrate counting simply means adding up the total amount of carbohydrate grams in your meals and snacks. Try to eat about the same amount at each meal. Foods  with carbohydrates are listed below. Each portion below is 1 carbohydrate serving or 15 grams of carbohydrates. Ask your dietician how many grams of carbohydrates you should eat at each meal or snack. Grains and Starches  1 slice bread.   English muffin or hotdog/hamburger bun.   cup cold cereal (unsweetened).   cup cooked pasta or rice.   cup starchy vegetables (corn, potatoes, peas, beans, winter squash).  1 tortilla (6 inches).   bagel.  1 waffle or pancake (size of a CD).   cup cooked cereal.  4 to 6 small crackers. *Whole grain is recommended. Fruit  1 cup fresh unsweetened berries, melon, papaya, pineapple.  1 small fresh fruit.   banana or mango.   cup fruit juice (4 oz unsweetened).   cup canned fruit in natural juice or water.  2 tbs dried fruit.  12 to 15 grapes or cherries. Milk and Yogurt  1 cup fat-free or 1% milk.  1 cup soy milk.  6 oz light yogurt with sugar-free sweetener.  6 oz low-fat soy yogurt.  6 oz plain yogurt. Vegetables  1 cup raw or  cup cooked is counted as 0 carbohydrates or a "free" food.  If you eat 3 or more servings at 1 meal, count them as 1 carbohydrate serving. Other Carbohydrates   oz chips or pretzels.   cup ice cream or frozen yogurt.   cup sherbet or sorbet.  2 inch square cake, no frosting.  1 tbs honey, sugar, jam, jelly, or syrup.  2 small cookies.  3 squares of graham crackers.  3 cups popcorn.  6 crackers.  1 cup broth-based soup.  Count 1 cup casserole or other mixed foods as 2 carbohydrate servings.  Foods with less than 20 calories in a serving may be counted as 0 carbohydrates or a "free" food. You may want to purchase a book or computer software that lists the carbohydrate gram counts of different foods. In addition, the nutrition facts panel on the labels of the foods you eat are a good source of this information. The label will tell you how big the serving size is and the  total number of carbohydrate grams you will be eating per serving. Divide this number by 15 to obtain the number of carbohydrate servings in a portion. Remember, 1 carbohydrate serving equals 15 grams of carbohydrate. SERVING SIZES Measuring foods and serving sizes helps you make sure you are getting the right amount of food. The list below tells how big or small some common serving sizes are.  1 oz.........4 stacked dice.  3 oz.........Deck of cards.  1 tsp........Tip of little finger.  1 tbs........Thumb.  2 tbs........Golf ball.   cup.......Half of a fist.  1 cup........A fist. SAMPLE DIABETES MEAL PLAN Below is a sample meal plan that includes foods from the grain and starches, dairy, vegetable, fruit, and meat groups. A dietician can individualize a meal plan to fit your calorie needs and tell you the number of servings needed from each food group. However, controlling the total amount of carbohydrates in your meal or snack is more important than making sure you include all of the food groups at every meal. You may interchange carbohydrate containing foods (dairy, starches, and fruits). The meal plan below is an example of a 2000 calorie diet   using carbohydrate counting. This meal plan has 17 carbohydrate servings. Breakfast  1 cup oatmeal (2 carb servings).   cup light yogurt (1 carb serving).  1 cup blueberries (1 carb serving).   cup almonds. Snack  1 large apple (2 carb servings).  1 low-fat string cheese stick. Lunch  Chicken breast salad.  1 cup spinach.   cup chopped tomatoes.  2 oz chicken breast, sliced.  2 tbs low-fat Italian dressing.  12 whole-wheat crackers (2 carb servings).  12 to 15 grapes (1 carb serving).  1 cup low-fat milk (1 carb serving). Snack  1 cup carrots.   cup hummus (1 carb serving). Dinner  3 oz broiled salmon.  1 cup brown rice (3 carb servings). Snack  1  cups steamed broccoli (1 carb serving) drizzled with 1  tsp olive oil and lemon juice.  1 cup light pudding (2 carb servings). DIABETES MEAL PLANNING WORKSHEET Your dietician can use this worksheet to help you decide how many servings of foods and what types of foods are right for you.  BREAKFAST Food Group and Servings / Carb Servings Grain/Starches __________________________________ Dairy __________________________________________ Vegetable ______________________________________ Fruit ___________________________________________ Meat __________________________________________ Fat ____________________________________________ LUNCH Food Group and Servings / Carb Servings Grain/Starches ___________________________________ Dairy ___________________________________________ Fruit ____________________________________________ Meat ___________________________________________ Fat _____________________________________________ DINNER Food Group and Servings / Carb Servings Grain/Starches ___________________________________ Dairy ___________________________________________ Fruit ____________________________________________ Meat ___________________________________________ Fat _____________________________________________ SNACKS Food Group and Servings / Carb Servings Grain/Starches ___________________________________ Dairy ___________________________________________ Vegetable _______________________________________ Fruit ____________________________________________ Meat ___________________________________________ Fat _____________________________________________ DAILY TOTALS Starches _________________________ Vegetable ________________________ Fruit ____________________________ Dairy ____________________________ Meat ____________________________ Fat ______________________________ Document Released: 01/19/2005 Document Revised: 07/17/2011 Document Reviewed: 11/30/2008 ExitCare Patient Information 2014 [employer].  

## 2013-10-16 NOTE — Progress Notes (Signed)
Patient ID: [name], female   DOB: Aug 14, 1985, 28 y.o.   MRN: [id number]   [name], is a 28 y.o. female  [id number]  [id number]  DOB - Aug 14, 1985  CC:  Chief Complaint  Patient presents with  . Establish Care       HPI: [name] is a 28 y.o. female here today to establish medical care. Patient was recently seen in the ER and diagnosed with new onset diabetes mellitus, hemoglobin A1c was not done but her CBG was greater than 400 on arrival. She has family history of DM. No personal history of hypertension or other chronic disease. She's not on any medication except metformin recently started. Her blood sugar has been controlled with metformin, she does not endorse any side effects to metformin. She does not smoke cigarette, she does not drink alcohol. Patient has No headache, No chest pain, No abdominal pain - No Nausea, No new weakness tingling or numbness, No Cough - SOB.  No Known Allergies Past Medical History  Diagnosis Date  . Asthma   . Obesity    Current Outpatient Prescriptions on File Prior to Visit  Medication Sig Dispense Refill  . albuterol (PROVENTIL HFA;VENTOLIN HFA) 108 (90 BASE) MCG/ACT inhaler Inhale 2 puffs into the lungs every 6 (six) hours as needed for wheezing or shortness of breath.      Marland Kitchen. azithromycin (ZITHROMAX Z-PAK) 250 MG tablet Take 1 tablet (250 mg total) by mouth daily.  4 tablet  0   No current facility-administered medications on file prior to visit.   Family History  Problem Relation Age of Onset  . Diabetes Maternal Aunt    History   Social History  . Marital Status: Single    Spouse Name: N/A    Number of Children: N/A  . Years of Education: N/A   Occupational History  . Not on file.   Social History Main Topics  . Smoking status: Never Smoker   . Smokeless tobacco: Not on file  . Alcohol Use: No  . Drug Use: No  . Sexual Activity: Yes    Birth Control/ Protection: None   Other Topics Concern  . Not  on file   Social History Narrative  . No narrative on file    Review of Systems: Constitutional: Negative for fever, chills, diaphoresis, activity change, appetite change and fatigue. HENT: Negative for ear pain, nosebleeds, congestion, facial swelling, rhinorrhea, neck pain, neck stiffness and ear discharge.  Eyes: Negative for pain, discharge, redness, itching and visual disturbance. Respiratory: Negative for cough, choking, chest tightness, shortness of breath, wheezing and stridor.  Cardiovascular: Negative for chest pain, palpitations and leg swelling. Gastrointestinal: Negative for abdominal distention. Genitourinary: Negative for dysuria, urgency, frequency, hematuria, flank pain, decreased urine volume, difficulty urinating and dyspareunia.  Musculoskeletal: Negative for back pain, joint swelling, arthralgia and gait problem. Neurological: Negative for dizziness, tremors, seizures, syncope, facial asymmetry, speech difficulty, weakness, light-headedness, numbness and headaches.  Hematological: Negative for adenopathy. Does not bruise/bleed easily. Psychiatric/Behavioral: Negative for hallucinations, behavioral problems, confusion, dysphoric mood, decreased concentration and agitation.    Objective:   Filed Vitals:   10/16/13 1533  BP: 132/78  Pulse: 84  Temp: 98.9 F (37.2 C)  Resp: 16    Physical Exam: Constitutional: Patient appears well-developed and well-nourished. No distress., Obese HENT: Normocephalic, atraumatic, External right and left ear normal. Oropharynx is clear and moist.  Eyes: Conjunctivae and EOM are normal. PERRLA, no scleral icterus. Neck: Normal ROM. Neck supple. No JVD. No  tracheal deviation. No thyromegaly. CVS: RRR, S1/S2 +, no murmurs, no gallops, no carotid bruit.  Pulmonary: Effort and breath sounds normal, no stridor, rhonchi, wheezes, rales.  Abdominal: Soft. BS +, no distension, tenderness, rebound or guarding.  Musculoskeletal: Normal  range of motion. No edema and no tenderness.  Lymphadenopathy: No lymphadenopathy noted, cervical, inguinal or axillary Neuro: Alert. Normal reflexes, muscle tone coordination. No cranial nerve deficit. Skin: Skin is warm and dry. No rash noted. Not diaphoretic. No erythema. No pallor. Psychiatric: Normal mood and affect. Behavior, judgment, thought content normal.  Lab Results  Component Value Date   WBC 6.8 07/22/2013   HGB 12.7 07/22/2013   HCT 37.8 07/22/2013   MCV 74.0* 07/22/2013   PLT 194 07/22/2013   Lab Results  Component Value Date   CREATININE 0.76 07/22/2013   BUN 12 07/22/2013   NA 138 07/22/2013   K 4.1 07/22/2013   CL 100 07/22/2013   CO2 23 07/22/2013    Lab Results  Component Value Date   HGBA1C  Value: 5.6 (NOTE) The ADA recommends the following therapeutic goal for glycemic control related to Hgb A1c measurement: Goal of therapy: <6.5 Hgb A1c  Reference: American Diabetes Association: Clinical Practice Recommendations 2010, Diabetes Care, 2010, 33: (Suppl  1). 11/10/2008   Lipid Panel  No results found for this basename: chol, trig, hdl, cholhdl, vldl, ldlcalc       Assessment and plan:   1. Diabetes: Newly diagnosed  - Glucose (CBG) - HgB A1c - CBC with Differential - COMPLETE METABOLIC PANEL WITH GFR - Lipid panel - TSH  - metFORMIN (GLUCOPHAGE) 500 MG tablet; Take 1 tablet (500 mg total) by mouth 2 (two) times daily with a meal.  Dispense: 180 tablet; Refill: 3  - Ambulatory referral to diabetic education  Patient was extensively counseled on nutrition and exercise  Return in about 3 months (around 01/16/2014), or if symptoms worsen or fail to improve, for Hemoglobin A1C and Follow up, DM, Annual Physical.  The patient was given clear instructions to go to ER or return to medical center if symptoms don't improve, worsen or new problems develop. The patient verbalized understanding. The patient was told to call to get lab results if they haven't heard  anything in the next week.     This note has been created with Education officer, environmental. Any transcriptional errors are unintentional.    Jeanann Lewandowsky, MD, MHA, FACP, FAAP [facility] [place], [place] [phone]   10/16/2013, 4:06 PM

## 2013-10-17 ENCOUNTER — Ambulatory Visit: Payer: Self-pay | Admitting: Internal Medicine

## 2013-10-17 ENCOUNTER — Telehealth: Payer: Self-pay | Admitting: *Deleted

## 2013-10-17 ENCOUNTER — Telehealth: Payer: Self-pay | Admitting: Emergency Medicine

## 2013-10-17 DIAGNOSIS — R7989 Other specified abnormal findings of blood chemistry: Secondary | ICD-10-CM

## 2013-10-17 LAB — COMPLETE METABOLIC PANEL WITH GFR
ALK PHOS: 58 U/L (ref 39–117)
ALT: 13 U/L (ref 0–35)
AST: 14 U/L (ref 0–37)
Albumin: 3.8 g/dL (ref 3.5–5.2)
BUN: 13 mg/dL (ref 6–23)
CO2: 25 mEq/L (ref 19–32)
Calcium: 9.7 mg/dL (ref 8.4–10.5)
Chloride: 107 mEq/L (ref 96–112)
Creat: 1.08 mg/dL (ref 0.50–1.10)
GFR, Est African American: 81 mL/min
GFR, Est Non African American: 71 mL/min
Glucose, Bld: 156 mg/dL — ABNORMAL HIGH (ref 70–99)
POTASSIUM: 4.2 meq/L (ref 3.5–5.3)
Sodium: 141 mEq/L (ref 135–145)
Total Bilirubin: 0.3 mg/dL (ref 0.2–1.2)
Total Protein: 7 g/dL (ref 6.0–8.3)

## 2013-10-17 LAB — LIPID PANEL
CHOL/HDL RATIO: 3.2 ratio
CHOLESTEROL: 131 mg/dL (ref 0–200)
HDL: 41 mg/dL (ref >39)
LDL Cholesterol: 73 mg/dL (ref 0–99)
Triglycerides: 85 mg/dL (ref <150)
VLDL: 17 mg/dL (ref 0–40)

## 2013-10-17 LAB — TSH: TSH: 0.127 u[IU]/mL — ABNORMAL LOW (ref 0.350–4.500)

## 2013-10-17 NOTE — Telephone Encounter (Signed)
Message copied by Darlis LoanSMITH, Ludwika Rodd D on Fri Oct 17, 2013  4:45 PM ------      Message from: Quentin AngstJEGEDE, OLUGBEMIGA E      Created: Fri Oct 17, 2013 10:35 AM       Please inform patient that her hemoglobin A1c shows that she is at increased risk of developing diabetes, we encouraged her to engage in regular physical exercise of at least 3 times a week, 30 minutes each time, and low carbohydrate, low sugar diet. Her thyroid hormone indicator is slightly off, I will need to further investigate. She can't walk in for lab visit to have lab draw for thyroid function test anytime next week during office hours            Please schedule patient for lab visit for thyroid function test ------

## 2013-10-17 NOTE — Telephone Encounter (Signed)
I called pt to f/u to make sure she understood her treatment. I asked her if she had any questions or concerns. She said no and that she enjoyed her visit here at the [facility].

## 2013-10-17 NOTE — Telephone Encounter (Signed)
Pt given test results with medication instructions to exercise,low carbohydrate diet and low sugar. Pt also given scheduled lab visit for further thyroid testing 10/23/13 @ 10am

## 2013-10-23 ENCOUNTER — Ambulatory Visit: Payer: Self-pay | Attending: Internal Medicine

## 2013-10-23 DIAGNOSIS — R7989 Other specified abnormal findings of blood chemistry: Secondary | ICD-10-CM

## 2013-10-24 LAB — T3, FREE: T3, Free: 2.8 pg/mL (ref 2.3–4.2)

## 2013-10-24 LAB — T4, FREE: Free T4: 1.19 ng/dL (ref 0.80–1.80)

## 2013-10-27 ENCOUNTER — Telehealth: Payer: Self-pay | Admitting: *Deleted

## 2013-10-27 NOTE — Telephone Encounter (Signed)
Pt is aware that her normal lab results

## 2013-10-27 NOTE — Telephone Encounter (Signed)
Message copied by Raynelle CharyWINFREE, Tamla Winkels R on Mon Oct 27, 2013 11:54 AM ------      Message from: Quentin AngstJEGEDE, OLUGBEMIGA E      Created: Sun Oct 26, 2013  4:23 PM       Please inform patient that her thyroid function is normal ------

## 2013-11-27 ENCOUNTER — Ambulatory Visit: Payer: Self-pay

## 2013-12-04 ENCOUNTER — Ambulatory Visit: Payer: Self-pay

## 2013-12-11 ENCOUNTER — Ambulatory Visit: Payer: Self-pay

## 2014-01-16 ENCOUNTER — Ambulatory Visit: Payer: Self-pay | Admitting: Internal Medicine

## 2014-05-01 ENCOUNTER — Emergency Department (HOSPITAL_COMMUNITY)
Admission: EM | Admit: 2014-05-01 | Discharge: 2014-05-01 | Disposition: A | Discharge status: Home / Self Care | Payer: Self-pay | Attending: Emergency Medicine | Admitting: Emergency Medicine

## 2014-05-01 ENCOUNTER — Encounter (HOSPITAL_COMMUNITY): Payer: Self-pay | Admitting: Emergency Medicine

## 2014-05-01 DIAGNOSIS — Z3202 Encounter for pregnancy test, result negative: Secondary | ICD-10-CM | POA: Insufficient documentation

## 2014-05-01 DIAGNOSIS — E669 Obesity, unspecified: Secondary | ICD-10-CM | POA: Insufficient documentation

## 2014-05-01 DIAGNOSIS — Z79899 Other long term (current) drug therapy: Secondary | ICD-10-CM | POA: Insufficient documentation

## 2014-05-01 DIAGNOSIS — J45909 Unspecified asthma, uncomplicated: Secondary | ICD-10-CM | POA: Insufficient documentation

## 2014-05-01 DIAGNOSIS — E1165 Type 2 diabetes mellitus with hyperglycemia: Secondary | ICD-10-CM | POA: Insufficient documentation

## 2014-05-01 DIAGNOSIS — R739 Hyperglycemia, unspecified: Secondary | ICD-10-CM

## 2014-05-01 DIAGNOSIS — Z792 Long term (current) use of antibiotics: Secondary | ICD-10-CM | POA: Insufficient documentation

## 2014-05-01 DIAGNOSIS — R42 Dizziness and giddiness: Secondary | ICD-10-CM

## 2014-05-01 HISTORY — DX: Type 2 diabetes mellitus without complications: E11.9

## 2014-05-01 LAB — CBC WITH DIFFERENTIAL/PLATELET
Basophils Absolute: 0 K/uL (ref 0.0–0.1)
Basophils Relative: 1 % (ref 0–1)
Eosinophils Absolute: 0.2 K/uL (ref 0.0–0.7)
Eosinophils Relative: 2 % (ref 0–5)
HCT: 38.3 % (ref 36.0–46.0)
Hemoglobin: 12.4 g/dL (ref 12.0–15.0)
Lymphocytes Relative: 42 % (ref 12–46)
Lymphs Abs: 2.8 K/uL (ref 0.7–4.0)
MCH: 23.7 pg — ABNORMAL LOW (ref 26.0–34.0)
MCHC: 32.4 g/dL (ref 30.0–36.0)
MCV: 73.2 fL — ABNORMAL LOW (ref 78.0–100.0)
Monocytes Absolute: 0.6 K/uL (ref 0.1–1.0)
Monocytes Relative: 8 % (ref 3–12)
Neutro Abs: 3.1 K/uL (ref 1.7–7.7)
Neutrophils Relative %: 47 % (ref 43–77)
Platelets: 230 K/uL (ref 150–400)
RBC: 5.23 MIL/uL — ABNORMAL HIGH (ref 3.87–5.11)
RDW: 13.4 % (ref 11.5–15.5)
WBC: 6.6 K/uL (ref 4.0–10.5)

## 2014-05-01 LAB — COMPREHENSIVE METABOLIC PANEL
ALBUMIN: 3.7 g/dL (ref 3.5–5.2)
ALK PHOS: 70 U/L (ref 39–117)
ALT: 22 U/L (ref 0–35)
AST: 20 U/L (ref 0–37)
Anion gap: 10 (ref 5–15)
BUN: 7 mg/dL (ref 6–23)
CALCIUM: 9.1 mg/dL (ref 8.4–10.5)
CO2: 23 mmol/L (ref 19–32)
Chloride: 102 mEq/L (ref 96–112)
Creatinine, Ser: 0.91 mg/dL (ref 0.50–1.10)
GFR calc Af Amer: >90 mL/min (ref >90)
GFR calc non Af Amer: 85 mL/min — ABNORMAL LOW (ref >90)
Glucose, Bld: 243 mg/dL — ABNORMAL HIGH (ref 70–99)
POTASSIUM: 3.6 mmol/L (ref 3.5–5.1)
SODIUM: 135 mmol/L (ref 135–145)
TOTAL PROTEIN: 7.2 g/dL (ref 6.0–8.3)
Total Bilirubin: 0.4 mg/dL (ref 0.3–1.2)

## 2014-05-01 LAB — I-STAT VENOUS BLOOD GAS, ED
Acid-base deficit: 2 mmol/L (ref 0.0–2.0)
Bicarbonate: 20.9 mEq/L (ref 20.0–24.0)
O2 Saturation: 85 %
PH VEN: 7.458 — AB (ref 7.250–7.300)
TCO2: 22 mmol/L (ref 0–100)
pCO2, Ven: 29.6 mmHg — ABNORMAL LOW (ref 45.0–50.0)
pO2, Ven: 46 mmHg — ABNORMAL HIGH (ref 30.0–45.0)

## 2014-05-01 LAB — URINALYSIS, ROUTINE W REFLEX MICROSCOPIC
Bilirubin Urine: NEGATIVE
Glucose, UA: >1000 mg/dL — AB
Hgb urine dipstick: NEGATIVE
Ketones, ur: 40 mg/dL — AB
LEUKOCYTES UA: NEGATIVE
Nitrite: NEGATIVE
PH: 5 (ref 5.0–8.0)
Protein, ur: NEGATIVE mg/dL
SPECIFIC GRAVITY, URINE: 1.025 (ref 1.005–1.030)
Urobilinogen, UA: 0.2 mg/dL (ref 0.0–1.0)

## 2014-05-01 LAB — URINE MICROSCOPIC-ADD ON

## 2014-05-01 LAB — POC URINE PREG, ED: PREG TEST UR: NEGATIVE

## 2014-05-01 LAB — CBG MONITORING, ED: Glucose-Capillary: 228 mg/dL — ABNORMAL HIGH (ref 70–99)

## 2014-05-01 LAB — KETONES, QUALITATIVE: ACETONE BLD: NEGATIVE

## 2014-05-01 MED ORDER — SODIUM CHLORIDE 0.9 % IV BOLUS (SEPSIS)
1000.0000 mL | Freq: Once | INTRAVENOUS | Status: AC
  Administered 2014-05-01: 1000 mL via INTRAVENOUS

## 2014-05-01 MED ORDER — MECLIZINE HCL 50 MG PO TABS: 50.0000 mg | ORAL_TABLET | Freq: Three times a day (TID) | ORAL | Status: DC | PRN

## 2014-05-01 NOTE — ED Provider Notes (Signed)
CSN: [id number]     Arrival date & time 05/01/14  1151 History   First MD Initiated Contact with Patient 05/01/14 1157     Chief Complaint  Patient presents with  . Dizziness     (Consider location/radiation/quality/duration/timing/severity/associated sxs/prior Treatment) HPI [name] is a 28 y.o. female with history of diabetes and asthma, presents to emergency department with complaint of weakness, dry mouth, polyuria, polydipsia, intermittent dizziness. She states symptoms have been there for 2 days. She states she did check her blood sugar and it was 130 yesterday. She states that her dizziness is intermittent. States it is usually worse in the morning and in the afternoon, but only lasts minutes to hours at a time. She describes dizziness as room spinning around. She has not tried taking any medications for this. She denies any associated symptoms, but states she did just get over a cold. She denies any pain or ringing in her ears. No fever or chills. States her legs have been sore, but states she also started exercising about a week and a half ago. States she does a lot of treadmill walking and crunches. She denies any dizziness at this time. She denies any pain at this time. She denies any other complaints.  Past Medical History  Diagnosis Date  . Asthma   . Obesity   . Diabetes mellitus without complication    Past Surgical History  Procedure Laterality Date  . Tubal ligation    . Cesarean section     Family History  Problem Relation Age of Onset  . Diabetes Maternal Aunt    History  Substance Use Topics  . Smoking status: Never Smoker   . Smokeless tobacco: Not on file  . Alcohol Use: No   OB History    No data available     Review of Systems  Constitutional: Negative for fever and chills.  Eyes: Negative for photophobia and visual disturbance.  Respiratory: Negative for cough, chest tightness and shortness of breath.   Cardiovascular: Negative for chest  pain, palpitations and leg swelling.  Gastrointestinal: Negative for nausea, vomiting, abdominal pain and diarrhea.  Endocrine: Positive for polydipsia and polyuria.  Genitourinary: Positive for frequency. Negative for dysuria, urgency, flank pain, vaginal bleeding, vaginal discharge, vaginal pain and pelvic pain.  Musculoskeletal: Negative for myalgias, arthralgias, neck pain and neck stiffness.  Skin: Negative for rash.  Neurological: Positive for dizziness. Negative for weakness, numbness and headaches.  All other systems reviewed and are negative.     Allergies  Review of patient's allergies indicates no known allergies.  Home Medications   Prior to Admission medications   Medication Sig Start Date End Date Taking? Authorizing Provider  albuterol (PROVENTIL HFA;VENTOLIN HFA) 108 (90 BASE) MCG/ACT inhaler Inhale 2 puffs into the lungs every 6 (six) hours as needed for wheezing or shortness of breath.    Historical Provider, MD  azithromycin (ZITHROMAX Z-PAK) 250 MG tablet Take 1 tablet (250 mg total) by mouth daily. 10/08/13   Doug SouSam Jacubowitz, MD  metFORMIN (GLUCOPHAGE) 500 MG tablet Take 1 tablet (500 mg total) by mouth 2 (two) times daily with a meal. 10/16/13   Olugbemiga E Jegede, MD   BP 141/83 mmHg  Pulse 67  Temp(Src) 97.9 F (36.6 C) (Oral)  Resp 18  SpO2 100%  LMP 04/17/2014 Physical Exam  Constitutional: She is oriented to person, place, and time. She appears well-developed and well-nourished. No distress.  HENT:  Head: Normocephalic.  Nose: Nose normal.  Mouth/Throat: Oropharynx is  clear and moist.  Eyes: Conjunctivae and EOM are normal. Pupils are equal, round, and reactive to light.  Neck: Normal range of motion. Neck supple.  Cardiovascular: Normal rate, regular rhythm and normal heart sounds.   Pulmonary/Chest: Effort normal and breath sounds normal. No respiratory distress. She has no wheezes. She has no rales.  Abdominal: Soft. Bowel sounds are normal. She  exhibits no distension. There is no tenderness. There is no rebound.  Musculoskeletal: She exhibits no edema.  Neurological: She is alert and oriented to person, place, and time.  Skin: Skin is warm and dry.  Psychiatric: She has a normal mood and affect. Her behavior is normal.  Nursing note and vitals reviewed.   ED Course  Procedures (including critical care time) Labs Review Labs Reviewed  CBC WITH DIFFERENTIAL - Abnormal; Notable for the following:    RBC 5.23 (*)    MCV 73.2 (*)    MCH 23.7 (*)    All other components within normal limits  COMPREHENSIVE METABOLIC PANEL - Abnormal; Notable for the following:    Glucose, Bld 243 (*)    GFR calc non Af Amer 85 (*)    All other components within normal limits  URINALYSIS, ROUTINE W REFLEX MICROSCOPIC - Abnormal; Notable for the following:    APPearance CLOUDY (*)    Glucose, UA >1000 (*)    Ketones, ur 40 (*)    All other components within normal limits  URINE MICROSCOPIC-ADD ON - Abnormal; Notable for the following:    Squamous Epithelial / LPF FEW (*)    Crystals URIC ACID CRYSTALS (*)    All other components within normal limits  CBG MONITORING, ED - Abnormal; Notable for the following:    Glucose-Capillary 228 (*)    All other components within normal limits  I-STAT VENOUS BLOOD GAS, ED - Abnormal; Notable for the following:    pH, Ven 7.458 (*)    pCO2, Ven 29.6 (*)    pO2, Ven 46.0 (*)    All other components within normal limits  KETONES, QUALITATIVE  POC URINE PREG, ED    Imaging Review No results found.   EKG Interpretation None      MDM   Final diagnoses:  Hyperglycemia  Vertigo    Pt here with dizziness, polyuria, polydipsia. No dizziness at this time. Pt is diabetic. Will check UA, labs. VS normal.    2:24 PM No evidence of dka. Pt not acidotic, negative blood ketones. Anion gap 10. Glucose 243, pt did not take her metformin today. IV fluids given 1L of NS. Ua with no signs of infection,  polyuria most likely due to hyperglycemia. Discussed with Dr. Lorenza CambridgeJacubowtz who agrees, pt stable for d/c home. Discussed strict diet control, continue exercising, make sure she takes all of her medications. Meclizine for vertigo symptoms. Follow up with her doctor.    Filed Vitals:   05/01/14 1330 05/01/14 1345 05/01/14 1415 05/01/14 1447  BP: 115/65 127/67 117/69 114/71  Pulse: 61 69 69 71  Temp:    98.3 F (36.8 C)  TempSrc:    Oral  Resp:    18  SpO2: 100% 100% 100% 100%      Lottie Musselatyana A Monnie Anspach, PA-C 05/01/14 2040  Doug SouSam Jacubowitz, MD 05/04/14 2250

## 2014-05-01 NOTE — ED Notes (Signed)
Per patient, states her mouth has been really dry, been really thirsty, lower back and legs have been aching a whole lot. Also c/o lower back pain. Pt also c/o dizziness. Pt ambulated to room with steady gait. Pt in NAD, AAOX4. Pt has been checking blood sugar, was 130 yesterday.

## 2014-05-01 NOTE — Discharge Instructions (Signed)
Meclizine for dizziness as needed. Continue blood sugar control, continue to exercise. Drink plenty of fluids. Follow up with your doctor.    Benign Positional Vertigo Vertigo means you feel like you or your surroundings are moving when they are not. Benign positional vertigo is the most common form of vertigo. Benign means that the cause of your condition is not serious. Benign positional vertigo is more common in older adults. CAUSES  Benign positional vertigo is the result of an upset in the labyrinth system. This is an area in the middle ear that helps control your balance. This may be caused by a viral infection, head injury, or repetitive motion. However, often no specific cause is found. SYMPTOMS  Symptoms of benign positional vertigo occur when you move your head or eyes in different directions. Some of the symptoms may include:  Loss of balance and falls.  Vomiting.  Blurred vision.  Dizziness.  Nausea.  Involuntary eye movements (nystagmus). DIAGNOSIS  Benign positional vertigo is usually diagnosed by physical exam. If the specific cause of your benign positional vertigo is unknown, your caregiver may perform imaging tests, such as magnetic resonance imaging (MRI) or computed tomography (CT). TREATMENT  Your caregiver may recommend movements or procedures to correct the benign positional vertigo. Medicines such as meclizine, benzodiazepines, and medicines for nausea may be used to treat your symptoms. In rare cases, if your symptoms are caused by certain conditions that affect the inner ear, you may need surgery. HOME CARE INSTRUCTIONS   Follow your caregiver's instructions.  Move slowly. Do not make sudden body or head movements.  Avoid driving.  Avoid operating heavy machinery.  Avoid performing any tasks that would be dangerous to you or others during a vertigo episode.  Drink enough fluids to keep your urine clear or pale yellow. SEEK IMMEDIATE MEDICAL CARE IF:    You develop problems with walking, weakness, numbness, or using your arms, hands, or legs.  You have difficulty speaking.  You develop severe headaches.  Your nausea or vomiting continues or gets worse.  You develop visual changes.  Your family or friends notice any behavioral changes.  Your condition gets worse.  You have a fever.  You develop a stiff neck or sensitivity to light. MAKE SURE YOU:   Understand these instructions.  Will watch your condition.  Will get help right away if you are not doing well or get worse. Document Released: 01/30/2006 Document Revised: 07/17/2011 Document Reviewed: 01/12/2011 [facility] AveExit[facility] Patient Information 2015 [place], [place]. This information is not intended to replace advice given to you by your health care provider. Make sure you discuss any questions you have with your health care provider.   Diabetes Mellitus and Food It is important for you to manage your blood sugar (glucose) level. Your blood glucose level can be greatly affected by what you eat. Eating healthier foods in the appropriate amounts throughout the day at about the same time each day will help you control your blood glucose level. It can also help slow or prevent worsening of your diabetes mellitus. Healthy eating may even help you improve the level of your blood pressure and reach or maintain a healthy weight.  HOW CAN FOOD AFFECT ME? Carbohydrates Carbohydrates affect your blood glucose level more than any other type of food. Your dietitian will help you determine how many carbohydrates to eat at each meal and teach you how to count carbohydrates. Counting carbohydrates is important to keep your blood glucose at a healthy level, especially if  you are using insulin or taking certain medicines for diabetes mellitus. Alcohol Alcohol can cause sudden decreases in blood glucose (hypoglycemia), especially if you use insulin or take certain medicines for diabetes mellitus.  Hypoglycemia can be a life-threatening condition. Symptoms of hypoglycemia (sleepiness, dizziness, and disorientation) are similar to symptoms of having too much alcohol.  If your health care provider has given you approval to drink alcohol, do so in moderation and use the following guidelines:  Women should not have more than one drink per day, and men should not have more than two drinks per day. One drink is equal to:  12 oz of beer.  5 oz of wine.  1 oz of hard liquor.  Do not drink on an empty stomach.  Keep yourself hydrated. Have water, diet soda, or unsweetened iced tea.  Regular soda, juice, and other mixers might contain a lot of carbohydrates and should be counted. WHAT FOODS ARE NOT RECOMMENDED? As you make food choices, it is important to remember that all foods are not the same. Some foods have fewer nutrients per serving than other foods, even though they might have the same number of calories or carbohydrates. It is difficult to get your body what it needs when you eat foods with fewer nutrients. Examples of foods that you should avoid that are high in calories and carbohydrates but low in nutrients include:  Trans fats (most processed foods list trans fats on the Nutrition Facts label).  Regular soda.  Juice.  Candy.  Sweets, such as cake, pie, doughnuts, and cookies.  Fried foods. WHAT FOODS CAN I EAT? Have nutrient-rich foods, which will nourish your body and keep you healthy. The food you should eat also will depend on several factors, including:  The calories you need.  The medicines you take.  Your weight.  Your blood glucose level.  Your blood pressure level.  Your cholesterol level. You also should eat a variety of foods, including:  Protein, such as meat, poultry, fish, tofu, nuts, and seeds (lean animal proteins are best).  Fruits.  Vegetables.  Dairy products, such as milk, cheese, and yogurt (low fat is best).  Breads, grains, pasta,  cereal, rice, and beans.  Fats such as olive oil, trans fat-free margarine, canola oil, avocado, and olives. DOES EVERYONE WITH DIABETES MELLITUS HAVE THE SAME MEAL PLAN? Because every person with diabetes mellitus is different, there is not one meal plan that works for everyone. It is very important that you meet with a dietitian who will help you create a meal plan that is just right for you. Document Released: 01/19/2005 Document Revised: 04/29/2013 Document Reviewed: 03/21/2013 [facility] Patient Information 2015 [place], [place]. This information is not intended to replace advice given to you by your health care provider. Make sure you discuss any questions you have with your health care provider.

## 2014-05-01 NOTE — ED Provider Notes (Signed)
Complains of dizziness i.e. sensation of room spinning onset 2 weeks ago worse with moving her head or changing positions improved with remaining still. No nausea no tinnnitus no visual changes no difficulty with speech. She is presently not experiencing vertigo On exam alert Glasgow Coma Score 15 clinical nurse 2 through 12 grossly intact finger-nose normal pronator drift normal Romberg normal. Vertigo is likely peripheral in etiology   Doug SouSam Klyde Banka, MD 05/01/14 1359

## 2014-07-18 ENCOUNTER — Emergency Department (HOSPITAL_COMMUNITY): Payer: Medicaid Other

## 2014-07-18 ENCOUNTER — Encounter (HOSPITAL_COMMUNITY): Payer: Self-pay | Admitting: *Deleted

## 2014-07-18 ENCOUNTER — Emergency Department (HOSPITAL_COMMUNITY)
Admission: EM | Admit: 2014-07-18 | Discharge: 2014-07-18 | Disposition: A | Discharge status: Home / Self Care | Payer: Medicaid Other | Attending: Emergency Medicine | Admitting: Emergency Medicine

## 2014-07-18 DIAGNOSIS — J989 Respiratory disorder, unspecified: Secondary | ICD-10-CM | POA: Insufficient documentation

## 2014-07-18 DIAGNOSIS — R51 Headache: Secondary | ICD-10-CM | POA: Diagnosis not present

## 2014-07-18 DIAGNOSIS — Z79899 Other long term (current) drug therapy: Secondary | ICD-10-CM | POA: Diagnosis not present

## 2014-07-18 DIAGNOSIS — J159 Unspecified bacterial pneumonia: Secondary | ICD-10-CM | POA: Insufficient documentation

## 2014-07-18 DIAGNOSIS — K088 Other specified disorders of teeth and supporting structures: Secondary | ICD-10-CM | POA: Diagnosis not present

## 2014-07-18 DIAGNOSIS — K006 Disturbances in tooth eruption: Secondary | ICD-10-CM | POA: Insufficient documentation

## 2014-07-18 DIAGNOSIS — R062 Wheezing: Secondary | ICD-10-CM | POA: Diagnosis present

## 2014-07-18 DIAGNOSIS — K029 Dental caries, unspecified: Secondary | ICD-10-CM | POA: Insufficient documentation

## 2014-07-18 DIAGNOSIS — E669 Obesity, unspecified: Secondary | ICD-10-CM | POA: Insufficient documentation

## 2014-07-18 DIAGNOSIS — K0381 Cracked tooth: Secondary | ICD-10-CM | POA: Insufficient documentation

## 2014-07-18 DIAGNOSIS — J189 Pneumonia, unspecified organism: Secondary | ICD-10-CM

## 2014-07-18 DIAGNOSIS — E119 Type 2 diabetes mellitus without complications: Secondary | ICD-10-CM | POA: Insufficient documentation

## 2014-07-18 DIAGNOSIS — J45901 Unspecified asthma with (acute) exacerbation: Secondary | ICD-10-CM | POA: Insufficient documentation

## 2014-07-18 DIAGNOSIS — K0889 Other specified disorders of teeth and supporting structures: Secondary | ICD-10-CM

## 2014-07-18 LAB — BASIC METABOLIC PANEL
ANION GAP: 10 (ref 5–15)
BUN: 9 mg/dL (ref 6–23)
CHLORIDE: 105 mmol/L (ref 96–112)
CO2: 23 mmol/L (ref 19–32)
Calcium: 9.1 mg/dL (ref 8.4–10.5)
Creatinine, Ser: 0.9 mg/dL (ref 0.50–1.10)
GFR calc Af Amer: >90 mL/min (ref >90)
GFR calc non Af Amer: 86 mL/min — ABNORMAL LOW (ref >90)
GLUCOSE: 180 mg/dL — AB (ref 70–99)
Potassium: 3.8 mmol/L (ref 3.5–5.1)
SODIUM: 138 mmol/L (ref 135–145)

## 2014-07-18 LAB — CBC
HCT: 35.1 % — ABNORMAL LOW (ref 36.0–46.0)
HEMOGLOBIN: 11.6 g/dL — AB (ref 12.0–15.0)
MCH: 24.6 pg — AB (ref 26.0–34.0)
MCHC: 33 g/dL (ref 30.0–36.0)
MCV: 74.5 fL — AB (ref 78.0–100.0)
Platelets: 183 10*3/uL (ref 150–400)
RBC: 4.71 MIL/uL (ref 3.87–5.11)
RDW: 14.1 % (ref 11.5–15.5)
WBC: 4 10*3/uL (ref 4.0–10.5)

## 2014-07-18 LAB — BRAIN NATRIURETIC PEPTIDE: B NATRIURETIC PEPTIDE 5: 54.1 pg/mL (ref 0.0–100.0)

## 2014-07-18 LAB — I-STAT TROPONIN, ED: Troponin i, poc: 0 ng/mL (ref 0.00–0.08)

## 2014-07-18 MED ORDER — AZITHROMYCIN 250 MG PO TABS: 250.0000 mg | ORAL_TABLET | Freq: Every day | ORAL | Status: DC

## 2014-07-18 MED ORDER — AZITHROMYCIN 250 MG PO TABS
500.0000 mg | ORAL_TABLET | Freq: Once | ORAL | Status: AC
  Administered 2014-07-18: 500 mg via ORAL
  Filled 2014-07-18: qty 2

## 2014-07-18 MED ORDER — GUAIFENESIN 100 MG/5ML PO SOLN
5.0000 mL | Freq: Once | ORAL | Status: AC
  Administered 2014-07-18: 100 mg via ORAL
  Filled 2014-07-18: qty 5

## 2014-07-18 MED ORDER — IPRATROPIUM-ALBUTEROL 0.5-2.5 (3) MG/3ML IN SOLN
3.0000 mL | Freq: Once | RESPIRATORY_TRACT | Status: AC
  Administered 2014-07-18: 3 mL via RESPIRATORY_TRACT
  Filled 2014-07-18: qty 3

## 2014-07-18 MED ORDER — GUAIFENESIN 100 MG/5ML PO LIQD: 100.0000 mg | ORAL | Status: DC | PRN

## 2014-07-18 MED ORDER — AMOXICILLIN 500 MG PO CAPS: 500.0000 mg | ORAL_CAPSULE | Freq: Three times a day (TID) | ORAL | Status: DC

## 2014-07-18 MED ORDER — ALBUTEROL SULFATE HFA 108 (90 BASE) MCG/ACT IN AERS: 2.0000 | INHALATION_SPRAY | Freq: Four times a day (QID) | RESPIRATORY_TRACT | Status: DC | PRN

## 2014-07-18 NOTE — ED Notes (Addendum)
Patient with onset of sob and body aches last night.  States her legs and back are painful. Patient reports she did use her inhaler w/o relief at 2000.  She also took motrin at [employer] as well.  She is alert.  No s/sx of distress.  Denies recent travel.  No birth control, she has tubal.  Patient does have a hx of asthma.  She also has diabetes, non insulin depedent.  cbg was 165  Patient also complains of tooth ache on the left back upper side.

## 2014-07-18 NOTE — Discharge Instructions (Signed)
Please follow up with your primary care physician in 1-2 days. If you do not have one please [facility] the [facility] number listed above. Please use your inhaler two puffs every four to six hours. Please take your antibiotic until completion. Please read all discharge instructions and return precautions.   Pneumonia Pneumonia is an infection of the lungs.  CAUSES Pneumonia may be caused by bacteria or a virus. Usually, these infections are caused by breathing infectious particles into the lungs (respiratory tract). SIGNS AND SYMPTOMS  1. Cough. 2. Fever. 3. Chest pain. 4. Increased rate of breathing. 5. Wheezing. 6. Mucus production. DIAGNOSIS  If you have the common symptoms of pneumonia, your health care provider will typically confirm the diagnosis with a chest X-ray. The X-ray will show an abnormality in the lung (pulmonary infiltrate) if you have pneumonia. Other tests of your blood, urine, or sputum may be done to find the specific cause of your pneumonia. Your health care provider may also do tests (blood gases or pulse oximetry) to see how well your lungs are working. TREATMENT  Some forms of pneumonia may be spread to other people when you cough or sneeze. You may be asked to wear a mask before and during your exam. Pneumonia that is caused by bacteria is treated with antibiotic medicine. Pneumonia that is caused by the influenza virus may be treated with an antiviral medicine. Most other viral infections must run their course. These infections will not respond to antibiotics.  HOME CARE INSTRUCTIONS   Cough suppressants may be used if you are losing too much rest. However, coughing protects you by clearing your lungs. You should avoid using cough suppressants if you can.  Your health care provider may have prescribed medicine if he or she thinks your pneumonia is caused by bacteria or influenza. Finish your medicine even if you start to feel better.  Your health care  provider may also prescribe an expectorant. This loosens the mucus to be coughed up.  Take medicines only as directed by your health care provider.  Do not smoke. Smoking is a common cause of bronchitis and can contribute to pneumonia. If you are a smoker and continue to smoke, your cough may last several weeks after your pneumonia has cleared.  A cold steam vaporizer or humidifier in your room or home may help loosen mucus.  Coughing is often worse at night. Sleeping in a semi-upright position in a recliner or using a couple pillows under your head will help with this.  Get rest as you feel it is needed. Your body will usually let you know when you need to rest. PREVENTION A pneumococcal shot (vaccine) is available to prevent a common bacterial cause of pneumonia. This is usually suggested for:  People over 29 years old.  Patients on chemotherapy.  People with chronic lung problems, such as bronchitis or emphysema.  People with immune system problems. If you are over 65 or have a high risk condition, you may receive the pneumococcal vaccine if you have not received it before. In some countries, a routine influenza vaccine is also recommended. This vaccine can help prevent some cases of pneumonia.You may be offered the influenza vaccine as part of your care. If you smoke, it is time to quit. You may receive instructions on how to stop smoking. Your health care provider can provide medicines and counseling to help you quit. SEEK MEDICAL CARE IF: You have a fever. SEEK IMMEDIATE MEDICAL CARE IF:  Your illness becomes worse. This is especially true if you are elderly or weakened from any other disease.  You cannot control your cough with suppressants and are losing sleep.  You begin coughing up blood.  You develop pain which is getting worse or is uncontrolled with medicines.  Any of the symptoms which initially brought you in for treatment are getting worse rather than  better.  You develop shortness of breath or chest pain. MAKE SURE YOU:   Understand these instructions.  Will watch your condition.  Will get help right away if you are not doing well or get worse. Document Released: 04/24/2005 Document Revised: 09/08/2013 Document Reviewed: 07/14/2010 [facility] Patient Information 2015 [place], [place]. This information is not intended to replace advice given to you by your health care provider. Make sure you discuss any questions you have with your health care provider.  RESOURCE GUIDE  If you do not have a primary care doctor to follow up with regarding today's visit, please call the [facility] at [phone] to make an appointment. Hours of operation are 10am - 7pm, Monday through Friday, and they have a sliding scale fee.    Dental Assistance Please contact the on-call dentist listed on your discharge papers WITHIN 48 HOURS.  If the on-call dentist agrees that your condition is emergent, there is a CHANCE (not a guarantee) that you MAY receive a savings on your visit at this point. If you wait more than 48 hours, it will not be considered an emergency.   Drs. Moreen Fowler Civils:  [address], [place], [place], [address], [phone]  Short-notice availability  Tooth evaluation: $100  Emergency Treatment: $200 (including exam, xrays, tooth extraction, and post-op visit)  Patients with Medicaid: [facility] [phone] [address], [phone] [address] [phone]  If unable to pay, or uninsured, contact [facility] [phone] in [place], [phone] in [facility]) to become qualified for the adult dental clinic  Other Low-Cost Community Dental Services: - [facility]: [address] [place], [place], [place], [address], [phone], Ext. [phone], 2nd and 4th Thursday of the month at 6:30am.  10 clients each day by appointment, can sometimes see walk-in patients if someone does not show  for an appointment. [facility]:  [address] [place], [place], [address], [phone] [facility]:  [address], [place], [place], [address], [phone] [facility] Health Department:  [phone] [facility] Health Department:  [phone] [facility] Health Department:  [phone]

## 2014-07-18 NOTE — ED Provider Notes (Signed)
CSN: [id number]     Arrival date & time 07/18/14  0557 History   First MD Initiated Contact with Patient 07/18/14 0559     Chief Complaint  Patient presents with  . Shortness of Breath  . Generalized Body Aches     (Consider location/radiation/quality/duration/timing/severity/associated sxs/prior Treatment) HPI Comments: Patient is a 29 year old female past medical history significant for DM, asthma presenting to the emergency department for myalgias, cough, wheezing, shortness of breath, chest tightness that began around 6 PM last evening. Patient states that she continue taking her inhaler once and one dose of ibuprofen around 8 PM last evening with no improvement. She states that her blood sugars are well controlled on her metformin, noted to be 81 and 165, last evening this morning. Did not take her metformin. Patient is also complaining of left upper toothache unknown onset of symptoms. No modifying factors identified. PERC negative.   Patient is a 29 y.o. female presenting with shortness of breath.  Shortness of Breath Associated symptoms: cough, headaches and wheezing   Associated symptoms: no abdominal pain and no vomiting     Past Medical History  Diagnosis Date  . Asthma   . Obesity   . Diabetes mellitus without complication    Past Surgical History  Procedure Laterality Date  . Tubal ligation    . Cesarean section     Family History  Problem Relation Age of Onset  . Diabetes Maternal Aunt    History  Substance Use Topics  . Smoking status: Never Smoker   . Smokeless tobacco: Not on file  . Alcohol Use: No   OB History    No data available     Review of Systems  Respiratory: Positive for cough, chest tightness, shortness of breath and wheezing.   Gastrointestinal: Negative for nausea, vomiting, abdominal pain and diarrhea.  Neurological: Positive for headaches.  All other systems reviewed and are negative.     Allergies  Other  Home Medications    Prior to Admission medications   Medication Sig Start Date End Date Taking? Authorizing Provider  ibuprofen (ADVIL,MOTRIN) 200 MG tablet Take 200 mg by mouth every 6 (six) hours as needed for mild pain.   Yes Historical Provider, MD  metFORMIN (GLUCOPHAGE) 500 MG tablet Take 1 tablet (500 mg total) by mouth 2 (two) times daily with a meal. 10/16/13  Yes Olugbemiga E Hyman Hopes, MD  albuterol (PROVENTIL HFA;VENTOLIN HFA) 108 (90 BASE) MCG/ACT inhaler Inhale 2 puffs into the lungs every 6 (six) hours as needed for wheezing or shortness of breath.    Historical Provider, MD  albuterol (PROVENTIL HFA;VENTOLIN HFA) 108 (90 BASE) MCG/ACT inhaler Inhale 2 puffs into the lungs every 6 (six) hours as needed for wheezing or shortness of breath. 07/18/14   Ramondo Dietze, PA-C  amoxicillin (AMOXIL) 500 MG capsule Take 1 capsule (500 mg total) by mouth 3 (three) times daily. 07/18/14   Hiroyuki Ozanich, PA-C  azithromycin (ZITHROMAX Z-PAK) 250 MG tablet Take 1 tablet (250 mg total) by mouth daily. Patient not taking: Reported on 05/01/2014 10/08/13   Doug Sou, MD  azithromycin (ZITHROMAX) 250 MG tablet Take 1 tablet (250 mg total) by mouth daily. Take 1 tablet ( ) daily starting tomorrow for four days 07/18/14   Francee Piccolo, PA-C  guaiFENesin (ROBITUSSIN) 100 MG/5ML liquid Take 5-10 mLs (100-200 mg total) by mouth every 4 (four) hours as needed for cough. 07/18/14   Kaira Stringfield, PA-C  meclizine (ANTIVERT) 50 MG tablet Take 1 tablet (50 mg  total) by mouth 3 (three) times daily as needed. Patient not taking: Reported on 07/18/2014 05/01/14   Tatyana Kirichenko, PA-C   BP 120/71 mmHg  Pulse 93  Temp(Src) 98.1 F (36.7 C) (Oral)  Resp 16  Ht 5\' 2"  (1.575 m)  Wt 198 lb (89.812 kg)  BMI 36.21 kg/m2  SpO2 100%  LMP  (LMP Unknown) Physical Exam  Constitutional: She is oriented to person, place, and time. She appears well-developed and well-nourished. No distress.  HENT:  Head:  Normocephalic and atraumatic.  Right Ear: External ear normal.  Left Ear: External ear normal.  Nose: Nose normal.  Mouth/Throat: Uvula is midline, oropharynx is clear and moist and mucous membranes are normal. No trismus in the jaw. Abnormal dentition. Dental caries present. No dental abscesses or uvula swelling. No oropharyngeal exudate.    Submental and sublingual spaces are soft.  Eyes: Conjunctivae are normal.  Neck: Normal range of motion. Neck supple.  Cardiovascular: Normal rate, regular rhythm and normal heart sounds.   Pulmonary/Chest: Effort normal and breath sounds normal. No respiratory distress. She has no wheezes.  Decreased breath sounds lower lung fields.   Abdominal: Soft.  Musculoskeletal: Normal range of motion. She exhibits no edema.  Lymphadenopathy:    She has no cervical adenopathy.  Neurological: She is alert and oriented to person, place, and time.  Skin: Skin is warm and dry. She is not diaphoretic.  Psychiatric: She has a normal mood and affect.  Nursing note and vitals reviewed.   ED Course  Procedures (including critical care time) Medications  ipratropium-albuterol (DUONEB) 0.5-2.5 (3) MG/3ML nebulizer solution 3 mL (3 mLs Nebulization Given 07/18/14 0650)  guaiFENesin (ROBITUSSIN) 100 MG/5ML solution 100 mg (100 mg Oral Given 07/18/14 0730)  azithromycin (ZITHROMAX) tablet 500 mg (500 mg Oral Given 07/18/14 0830)    Labs Review Labs Reviewed  CBC - Abnormal; Notable for the following:    Hemoglobin 11.6 (*)    HCT 35.1 (*)    MCV 74.5 (*)    MCH 24.6 (*)    All other components within normal limits  BASIC METABOLIC PANEL - Abnormal; Notable for the following:    Glucose, Bld 180 (*)    GFR calc non Af Amer 86 (*)    All other components within normal limits  BRAIN NATRIURETIC PEPTIDE  I-STAT TROPOININ, ED    Imaging Review Dg Chest 2 View  07/18/2014   CLINICAL DATA:  Shortness of breath and body aches  EXAM: CHEST  2 VIEW  COMPARISON:   10/07/2013  FINDINGS: Normal heart size. Opacity within the posterior left lower lobe identified worrisome for pneumonia. The right lung appears clear. No pleural effusion or edema.  IMPRESSION: 1. Posterior left lower lobe pneumonia.   Electronically Signed   By: Signa Kellaylor  Stroud M.D.   On: 07/18/2014 07:23     EKG Interpretation None      MDM   Final diagnoses:  Respiratory illness  CAP (community acquired pneumonia)  Pain, dental    Filed Vitals:   07/18/14 0907  BP: 120/71  Pulse: 93  Temp:   Resp: 16   Afebrile, NAD, non-toxic appearing, AAOx4.  I have reviewed nursing notes, vital signs, and all appropriate lab and imaging results for this patient.   1) CAP: Patient has been diagnosed with CAP via chest xray. Pt is not ill appearing, immunocompromised, and does not have multiple co morbidities, therefore I feel like the they can be treated as an OP with abx therapy.  Pt has been advised to return to the ED if symptoms worsen or they do not improve.    2) Dental Pain: Patient with toothache.  No gross abscess.  Exam unconcerning for Ludwig's angina or spread of infection.  Will treat with Amoxil and pain medicine.  Urged patient to follow-up with dentist.    Pt verbalizes understanding and is agreeable with plan. Patient is stable at time of discharge     Francee Piccolo, PA-C 07/18/14 1654  Richardean Canal, MD 07/20/14 1012

## 2014-09-26 ENCOUNTER — Emergency Department (HOSPITAL_COMMUNITY): Payer: Medicaid Other

## 2014-09-26 ENCOUNTER — Encounter (HOSPITAL_COMMUNITY): Payer: Self-pay

## 2014-09-26 ENCOUNTER — Emergency Department (HOSPITAL_COMMUNITY)
Admission: EM | Admit: 2014-09-26 | Discharge: 2014-09-26 | Disposition: A | Discharge status: Home / Self Care | Payer: Medicaid Other | Attending: Emergency Medicine | Admitting: Emergency Medicine

## 2014-09-26 DIAGNOSIS — J45901 Unspecified asthma with (acute) exacerbation: Secondary | ICD-10-CM | POA: Insufficient documentation

## 2014-09-26 DIAGNOSIS — J029 Acute pharyngitis, unspecified: Secondary | ICD-10-CM | POA: Insufficient documentation

## 2014-09-26 DIAGNOSIS — Z79899 Other long term (current) drug therapy: Secondary | ICD-10-CM | POA: Diagnosis not present

## 2014-09-26 DIAGNOSIS — E669 Obesity, unspecified: Secondary | ICD-10-CM | POA: Diagnosis not present

## 2014-09-26 DIAGNOSIS — E119 Type 2 diabetes mellitus without complications: Secondary | ICD-10-CM | POA: Insufficient documentation

## 2014-09-26 DIAGNOSIS — Z792 Long term (current) use of antibiotics: Secondary | ICD-10-CM | POA: Diagnosis not present

## 2014-09-26 DIAGNOSIS — R05 Cough: Secondary | ICD-10-CM | POA: Diagnosis present

## 2014-09-26 DIAGNOSIS — R079 Chest pain, unspecified: Secondary | ICD-10-CM | POA: Diagnosis not present

## 2014-09-26 DIAGNOSIS — J209 Acute bronchitis, unspecified: Secondary | ICD-10-CM

## 2014-09-26 LAB — BASIC METABOLIC PANEL
ANION GAP: 9 (ref 5–15)
BUN: 9 mg/dL (ref 6–20)
CO2: 24 mmol/L (ref 22–32)
CREATININE: 0.84 mg/dL (ref 0.44–1.00)
Calcium: 9.4 mg/dL (ref 8.9–10.3)
Chloride: 105 mmol/L (ref 101–111)
GFR calc Af Amer: >60 mL/min (ref >60)
GFR calc non Af Amer: >60 mL/min (ref >60)
Glucose, Bld: 121 mg/dL — ABNORMAL HIGH (ref 65–99)
Potassium: 3.6 mmol/L (ref 3.5–5.1)
Sodium: 138 mmol/L (ref 135–145)

## 2014-09-26 LAB — CBC
HCT: 36.3 % (ref 36.0–46.0)
Hemoglobin: 11.7 g/dL — ABNORMAL LOW (ref 12.0–15.0)
MCH: 24.2 pg — AB (ref 26.0–34.0)
MCHC: 32.2 g/dL (ref 30.0–36.0)
MCV: 75.2 fL — ABNORMAL LOW (ref 78.0–100.0)
Platelets: 212 10*3/uL (ref 150–400)
RBC: 4.83 MIL/uL (ref 3.87–5.11)
RDW: 14.1 % (ref 11.5–15.5)
WBC: 6.2 10*3/uL (ref 4.0–10.5)

## 2014-09-26 LAB — I-STAT TROPONIN, ED: TROPONIN I, POC: 0 ng/mL (ref 0.00–0.08)

## 2014-09-26 NOTE — ED Notes (Signed)
Pt not in room.

## 2014-09-26 NOTE — ED Provider Notes (Signed)
CSN: [id number]     Arrival date & time 09/26/14  1812 History   First MD Initiated Contact with Patient 09/26/14 1849     Chief Complaint  Patient presents with  . Chest Pain  . Cough     Patient is a 29 y.o. female presenting with chest pain and cough. The history is provided by the patient. No language interpreter was used.  Chest Pain Associated symptoms: cough   Cough Associated symptoms: chest pain    [name] presents for evaluation of chest pain and cough. She reports 2-3 days of cough with increased sputum production with associated shortness of breath. She has nasal congestion and sore throat. She has chest pain with coughing. Her daughters are sick with a viral illness. She denies any fevers, vomiting, diarrhea. She had leg pain a few days ago, this is now resolved. No leg swelling, no recent trauma, no hemoptysis. She is a nonsmoker, no hormone use, no history of DVT. Symptoms are moderate, constant, worsening.  Past Medical History  Diagnosis Date  . Asthma   . Obesity   . Diabetes mellitus without complication    Past Surgical History  Procedure Laterality Date  . Tubal ligation    . Cesarean section     Family History  Problem Relation Age of Onset  . Diabetes Maternal Aunt    History  Substance Use Topics  . Smoking status: Never Smoker   . Smokeless tobacco: Not on file  . Alcohol Use: No   OB History    No data available     Review of Systems  Respiratory: Positive for cough.   Cardiovascular: Positive for chest pain.  All other systems reviewed and are negative.     Allergies  Other  Home Medications   Prior to Admission medications   Medication Sig Start Date End Date Taking? Authorizing Provider  albuterol (PROVENTIL HFA;VENTOLIN HFA) 108 (90 BASE) MCG/ACT inhaler Inhale 2 puffs into the lungs every 6 (six) hours as needed for wheezing or shortness of breath.    Historical Provider, MD  albuterol (PROVENTIL HFA;VENTOLIN HFA) 108 (90  BASE) MCG/ACT inhaler Inhale 2 puffs into the lungs every 6 (six) hours as needed for wheezing or shortness of breath. 07/18/14   Jennifer Piepenbrink, PA-C  amoxicillin (AMOXIL) 500 MG capsule Take 1 capsule (500 mg total) by mouth 3 (three) times daily. 07/18/14   Jennifer Piepenbrink, PA-C  azithromycin (ZITHROMAX Z-PAK) 250 MG tablet Take 1 tablet (250 mg total) by mouth daily. Patient not taking: Reported on 05/01/2014 10/08/13   Doug Sou, MD  azithromycin (ZITHROMAX) 250 MG tablet Take 1 tablet (250 mg total) by mouth daily. Take 1 tablet ( ) daily starting tomorrow for four days 07/18/14   Francee Piccolo, PA-C  guaiFENesin (ROBITUSSIN) 100 MG/5ML liquid Take 5-10 mLs (100-200 mg total) by mouth every 4 (four) hours as needed for cough. 07/18/14   Jennifer Piepenbrink, PA-C  ibuprofen (ADVIL,MOTRIN) 200 MG tablet Take 200 mg by mouth every 6 (six) hours as needed for mild pain.    Historical Provider, MD  meclizine (ANTIVERT) 50 MG tablet Take 1 tablet (50 mg total) by mouth 3 (three) times daily as needed. Patient not taking: Reported on 07/18/2014 05/01/14   Jaynie Crumble, PA-C  metFORMIN (GLUCOPHAGE) 500 MG tablet Take 1 tablet (500 mg total) by mouth 2 (two) times daily with a meal. 10/16/13   Olugbemiga E Jegede, MD   BP 131/89 mmHg  Pulse 67  Temp(Src) 97.7 F (  36.5 C) (Oral)  Resp 20  SpO2 100%  LMP 09/07/2014 Physical Exam  Constitutional: She is oriented to person, place, and time. She appears well-developed and well-nourished.  HENT:  Head: Normocephalic and atraumatic.  Mouth/Throat: Oropharynx is clear and moist.  Cardiovascular: Normal rate and regular rhythm.   No murmur heard. Pulmonary/Chest: Effort normal and breath sounds normal. No respiratory distress.  Abdominal: Soft. There is no tenderness. There is no rebound and no guarding.  Musculoskeletal: She exhibits no edema or tenderness.  Neurological: She is alert and oriented to person, place, and  time.  Skin: Skin is warm and dry.  Psychiatric: She has a normal mood and affect. Her behavior is normal.  Nursing note and vitals reviewed.   ED Course  Procedures (including critical care time) Labs Review Labs Reviewed  CBC - Abnormal; Notable for the following:    Hemoglobin 11.7 (*)    MCV 75.2 (*)    MCH 24.2 (*)    All other components within normal limits  BASIC METABOLIC PANEL - Abnormal; Notable for the following:    Glucose, Bld 121 (*)    All other components within normal limits  I-STAT TROPOININ, ED    Imaging Review Dg Chest 2 View  09/26/2014   CLINICAL DATA:  Cough x2 days, chest pain  EXAM: CHEST  2 VIEW  COMPARISON:  07/18/2014  FINDINGS: Lungs are clear.  No pleural effusion or pneumothorax.  The heart is normal in size.  Visualized osseous structures are within normal limits.  IMPRESSION: Normal chest radiographs.   Electronically Signed   By: Charline BillsSriyesh  Krishnan M.D.   On: 09/26/2014 19:16     EKG Interpretation None      MDM   Final diagnoses:  Acute bronchitis, unspecified organism    Patient here for evaluation of cough, nasal congestion, sore throat, chest pain. Patient nontoxic appearing on examination with no respiratory distress. Lungs are clear bilaterally. History of presentation is not consistent with pneumonia, CHF, PE. Patient is perc negative. Discussed with patient home care for bronchitis with over-the-counter decongestants and PCP follow-up. Return precautions were discussed.    Gloria FossaElizabeth Janita Camberos, MD 09/26/14 2012

## 2014-09-26 NOTE — ED Notes (Signed)
Started having cough and chest tightness 2 days ago. Recently had PNA in march and this feels similar. Coughing up yellow stuff.

## 2014-09-26 NOTE — Discharge Instructions (Signed)
Acute Bronchitis °Bronchitis is inflammation of the airways that extend from the windpipe into the lungs (bronchi). The inflammation often causes mucus to develop. This leads to a cough, which is the most common symptom of bronchitis.  °In acute bronchitis, the condition usually develops suddenly and goes away over time, usually in a couple weeks. Smoking, allergies, and asthma can make bronchitis worse. Repeated episodes of bronchitis may cause further lung problems.  °CAUSES °Acute bronchitis is most often caused by the same virus that causes a cold. The virus can spread from person to person (contagious) through coughing, sneezing, and touching contaminated objects. °SIGNS AND SYMPTOMS  °· Cough.   °· Fever.   °· Coughing up mucus.   °· Body aches.   °· Chest congestion.   °· Chills.   °· Shortness of breath.   °· Sore throat.   °DIAGNOSIS  °Acute bronchitis is usually diagnosed through a physical exam. Your health care provider will also ask you questions about your medical history. Tests, such as chest X-rays, are sometimes done to rule out other conditions.  °TREATMENT  °Acute bronchitis usually goes away in a couple weeks. Oftentimes, no medical treatment is necessary. Medicines are sometimes given for relief of fever or cough. Antibiotic medicines are usually not needed but may be prescribed in certain situations. In some cases, an inhaler may be recommended to help reduce shortness of breath and control the cough. A cool mist vaporizer may also be used to help thin bronchial secretions and make it easier to clear the chest.  °HOME CARE INSTRUCTIONS °· Get plenty of rest.   °· Drink enough fluids to keep your urine clear or pale yellow (unless you have a medical condition that requires fluid restriction). Increasing fluids may help thin your respiratory secretions (sputum) and reduce chest congestion, and it will prevent dehydration.   °· Take medicines only as directed by your health care provider. °· If  you were prescribed an antibiotic medicine, finish it all even if you start to feel better. °· Avoid smoking and secondhand smoke. Exposure to cigarette smoke or irritating chemicals will make bronchitis worse. If you are a smoker, consider using nicotine gum or skin patches to help control withdrawal symptoms. Quitting smoking will help your lungs heal faster.   °· Reduce the chances of another bout of acute bronchitis by washing your hands frequently, avoiding people with cold symptoms, and trying not to touch your hands to your mouth, nose, or eyes.   °· Keep all follow-up visits as directed by your health care provider.   °SEEK MEDICAL CARE IF: °Your symptoms do not improve after 1 week of treatment.  °SEEK IMMEDIATE MEDICAL CARE IF: °· You develop an increased fever or chills.   °· You have chest pain.   °· You have severe shortness of breath. °· You have bloody sputum.   °· You develop dehydration. °· You faint or repeatedly feel like you are going to pass out. °· You develop repeated vomiting. °· You develop a severe headache. °MAKE SURE YOU:  °· Understand these instructions. °· Will watch your condition. °· Will get help right away if you are not doing well or get worse. °Document Released: 06/01/2004 Document Revised: 09/08/2013 Document Reviewed: 10/15/2012 °[employer]® Patient Information ©2015 [employer]. This information is not intended to replace advice given to you by your health care provider. Make sure you discuss any questions you have with your health care provider. ° ° °Viral Infections °A viral infection can be caused by different types of viruses. Most viral infections are not serious and resolve on their   own. However, some infections may cause severe symptoms and may lead to further complications. °SYMPTOMS °Viruses can frequently cause: °· Minor sore throat. °· Aches and pains. °· Headaches. °· Runny nose. °· Different types of rashes. °· Watery eyes. °· Tiredness. °· Cough. °· Loss of  appetite. °· Gastrointestinal infections, resulting in nausea, vomiting, and diarrhea. °These symptoms do not respond to antibiotics because the infection is not caused by bacteria. However, you might catch a bacterial infection following the viral infection. This is sometimes called a "superinfection." Symptoms of such a bacterial infection may include: °· Worsening sore throat with pus and difficulty swallowing. °· Swollen neck glands. °· Chills and a high or persistent fever. °· Severe headache. °· Tenderness over the sinuses. °· Persistent overall ill feeling (malaise), muscle aches, and tiredness (fatigue). °· Persistent cough. °· Yellow, green, or brown mucus production with coughing. °HOME CARE INSTRUCTIONS  °· Only take over-the-counter or prescription medicines for pain, discomfort, diarrhea, or fever as directed by your caregiver. °· Drink enough water and fluids to keep your urine clear or pale yellow. Sports drinks can provide valuable electrolytes, sugars, and hydration. °· Get plenty of rest and maintain proper nutrition. Soups and broths with crackers or rice are fine. °SEEK IMMEDIATE MEDICAL CARE IF:  °· You have severe headaches, shortness of breath, chest pain, neck pain, or an unusual rash. °· You have uncontrolled vomiting, diarrhea, or you are unable to keep down fluids. °· You or your child has an oral temperature above 102° F (38.9° C), not controlled by medicine. °· Your baby is older than 3 months with a rectal temperature of 102° F (38.9° C) or higher. °· Your baby is 3 months old or younger with a rectal temperature of 100.4° F (38° C) or higher. °MAKE SURE YOU:  °· Understand these instructions. °· Will watch your condition. °· Will get help right away if you are not doing well or get worse. °Document Released: 02/01/2005 Document Revised: 07/17/2011 Document Reviewed: 08/29/2010 °[employer]® Patient Information ©2015 [employer]. This information is not intended to replace advice given  to you by your health care provider. Make sure you discuss any questions you have with your health care provider. ° °

## 2014-10-19 ENCOUNTER — Emergency Department (HOSPITAL_COMMUNITY)
Admission: EM | Admit: 2014-10-19 | Discharge: 2014-10-20 | Disposition: A | Discharge status: Home / Self Care | Payer: Self-pay | Attending: Emergency Medicine | Admitting: Emergency Medicine

## 2014-10-19 ENCOUNTER — Emergency Department (HOSPITAL_COMMUNITY): Payer: Self-pay

## 2014-10-19 ENCOUNTER — Encounter (HOSPITAL_COMMUNITY): Payer: Self-pay | Admitting: *Deleted

## 2014-10-19 DIAGNOSIS — Z3202 Encounter for pregnancy test, result negative: Secondary | ICD-10-CM | POA: Insufficient documentation

## 2014-10-19 DIAGNOSIS — J40 Bronchitis, not specified as acute or chronic: Secondary | ICD-10-CM

## 2014-10-19 DIAGNOSIS — Z79899 Other long term (current) drug therapy: Secondary | ICD-10-CM | POA: Insufficient documentation

## 2014-10-19 DIAGNOSIS — J45901 Unspecified asthma with (acute) exacerbation: Secondary | ICD-10-CM | POA: Insufficient documentation

## 2014-10-19 DIAGNOSIS — E119 Type 2 diabetes mellitus without complications: Secondary | ICD-10-CM | POA: Insufficient documentation

## 2014-10-19 DIAGNOSIS — E669 Obesity, unspecified: Secondary | ICD-10-CM | POA: Insufficient documentation

## 2014-10-19 DIAGNOSIS — R079 Chest pain, unspecified: Secondary | ICD-10-CM | POA: Insufficient documentation

## 2014-10-19 LAB — CBG MONITORING, ED: GLUCOSE-CAPILLARY: 147 mg/dL — AB (ref 65–99)

## 2014-10-19 LAB — COMPREHENSIVE METABOLIC PANEL
ALT: 17 U/L (ref 14–54)
AST: 17 U/L (ref 15–41)
Albumin: 3.3 g/dL — ABNORMAL LOW (ref 3.5–5.0)
Alkaline Phosphatase: 65 U/L (ref 38–126)
Anion gap: 8 (ref 5–15)
BUN: 7 mg/dL (ref 6–20)
CO2: 27 mmol/L (ref 22–32)
CREATININE: 0.9 mg/dL (ref 0.44–1.00)
Calcium: 9.2 mg/dL (ref 8.9–10.3)
Chloride: 104 mmol/L (ref 101–111)
GLUCOSE: 166 mg/dL — AB (ref 65–99)
Potassium: 4.1 mmol/L (ref 3.5–5.1)
SODIUM: 139 mmol/L (ref 135–145)
TOTAL PROTEIN: 6.6 g/dL (ref 6.5–8.1)
Total Bilirubin: 0.2 mg/dL — ABNORMAL LOW (ref 0.3–1.2)

## 2014-10-19 LAB — CBC
HCT: 35.4 % — ABNORMAL LOW (ref 36.0–46.0)
Hemoglobin: 11.5 g/dL — ABNORMAL LOW (ref 12.0–15.0)
MCH: 24.2 pg — ABNORMAL LOW (ref 26.0–34.0)
MCHC: 32.5 g/dL (ref 30.0–36.0)
MCV: 74.5 fL — ABNORMAL LOW (ref 78.0–100.0)
PLATELETS: 229 10*3/uL (ref 150–400)
RBC: 4.75 MIL/uL (ref 3.87–5.11)
RDW: 13.6 % (ref 11.5–15.5)
WBC: 7.3 10*3/uL (ref 4.0–10.5)

## 2014-10-19 LAB — POC URINE PREG, ED: Preg Test, Ur: NEGATIVE

## 2014-10-19 MED ORDER — ALBUTEROL SULFATE HFA 108 (90 BASE) MCG/ACT IN AERS
2.0000 | INHALATION_SPRAY | Freq: Once | RESPIRATORY_TRACT | Status: AC
  Administered 2014-10-20: 2 via RESPIRATORY_TRACT
  Filled 2014-10-19: qty 6.7

## 2014-10-19 MED ORDER — PREDNISONE 20 MG PO TABS: 40.0000 mg | ORAL_TABLET | Freq: Every day | ORAL | Status: DC

## 2014-10-19 MED ORDER — ALBUTEROL SULFATE (2.5 MG/3ML) 0.083% IN NEBU: 2.5000 mg | INHALATION_SOLUTION | RESPIRATORY_TRACT | Status: DC | PRN

## 2014-10-19 MED ORDER — AZITHROMYCIN 250 MG PO TABS: 250.0000 mg | ORAL_TABLET | Freq: Every day | ORAL | Status: DC

## 2014-10-19 NOTE — ED Notes (Signed)
Pt is here with elevated sugar in the 400s and she states her asthma was acting up and states she took 2 treatments today.  Pt reports frequent inhaler.  NO distress, talking in full sentences.

## 2014-10-19 NOTE — Discharge Instructions (Signed)
Read the information below.  Use the prescribed medication as directed.  Please discuss all new medications with your pharmacist.  You may return to the Emergency Department at any time for worsening condition or any new symptoms that concern you.   If you develop worsening shortness of breath, uncontrolled wheezing, severe chest pain, or fevers despite using tylenol and/or ibuprofen, return for a recheck.      Please monitor your blood sugar closely and take your metformin as directed.  Follow closely with your primary care provider.

## 2014-10-19 NOTE — ED Provider Notes (Signed)
CSN: [id number]     Arrival date & time 10/19/14  2239 History   First MD Initiated Contact with Patient 10/19/14 2345     Chief Complaint  Patient presents with  . Hyperglycemia  . Nasal Congestion     (Consider location/radiation/quality/duration/timing/severity/associated sxs/prior Treatment) HPI   Pt with hx DM, asthma, p/w 3 weeks of persistent cough productive of yellow sputum, SOB, chest tightness, rhinorrhea.  Ran out of her albuterol hfa and neb treatments today.  Was seen in ED 09/26/14 after first few days of URI ymptoms, d/c home with OTC symptomatic treatment.  Pt reports mild improvement but unable to clear symptoms.  Denies fevers, chills, sore throat.  Had initial sick contacts of daughters with similar symptoms.   Denies recent immobilization, exogenous estrogen, leg swelling, personal or family history of blood clots.     Also noted her cbg to be over 400 at home, she took a prescribed metformin and the high blood sugar resolved.  States she monitors her blood sugar closely but doesn't usually take her metformin because her blood sugars are usually in the mid-100s.    Past Medical History  Diagnosis Date  . Asthma   . Obesity   . Diabetes mellitus without complication    Past Surgical History  Procedure Laterality Date  . Tubal ligation    . Cesarean section     Family History  Problem Relation Age of Onset  . Diabetes Maternal Aunt    History  Substance Use Topics  . Smoking status: Never Smoker   . Smokeless tobacco: Not on file  . Alcohol Use: No   OB History    No data available     Review of Systems  All other systems reviewed and are negative.     Allergies  Other  Home Medications   Prior to Admission medications   Medication Sig Start Date End Date Taking? Authorizing Provider  albuterol (PROVENTIL HFA;VENTOLIN HFA) 108 (90 BASE) MCG/ACT inhaler Inhale 2 puffs into the lungs every 6 (six) hours as needed for wheezing or shortness of  breath. 07/18/14   Jennifer Piepenbrink, PA-C  albuterol (PROVENTIL) (2.5 MG/3ML) 0.083% nebulizer solution Take 3 mLs (2.5 mg total) by nebulization every 4 (four) hours as needed for wheezing or shortness of breath. 10/19/14   Trixie Dredge, PA-C  azithromycin (ZITHROMAX) 250 MG tablet Take 1 tablet (250 mg total) by mouth daily. Take first 2 tablets together, then 1 every day until finished. 10/19/14   Trixie Dredge, PA-C  guaiFENesin (ROBITUSSIN) 100 MG/5ML liquid Take 5-10 mLs (100-200 mg total) by mouth every 4 (four) hours as needed for cough. 07/18/14   Jennifer Piepenbrink, PA-C  ibuprofen (ADVIL,MOTRIN) 200 MG tablet Take 200 mg by mouth every 6 (six) hours as needed for mild pain.    Historical Provider, MD  meclizine (ANTIVERT) 50 MG tablet Take 1 tablet (50 mg total) by mouth 3 (three) times daily as needed. Patient not taking: Reported on 07/18/2014 05/01/14   Jaynie Crumble, PA-C  metFORMIN (GLUCOPHAGE) 500 MG tablet Take 1 tablet (500 mg total) by mouth 2 (two) times daily with a meal. 10/16/13   Quentin Angst, MD  predniSONE (DELTASONE) 20 MG tablet Take 2 tablets (40 mg total) by mouth daily with breakfast. 10/19/14   Trixie Dredge, PA-C   BP 137/75 mmHg  Pulse 71  Temp(Src) 97.3 F (36.3 C) (Oral)  Resp 18  Ht 5\' 2"  (1.575 m)  Wt 195 lb (88.451 kg)  BMI  35.66 kg/m2  SpO2 100%  LMP 10/07/2014 Physical Exam  Constitutional: She appears well-developed and well-nourished. No distress.  HENT:  Head: Normocephalic and atraumatic.  Neck: Normal range of motion. Neck supple.  Cardiovascular: Normal rate and regular rhythm.   Pulmonary/Chest: Effort normal and breath sounds normal. No accessory muscle usage. No tachypnea. No respiratory distress. She has no decreased breath sounds. She has no wheezes. She has no rhonchi. She has no rales.  Abdominal: Soft. She exhibits no distension. There is no tenderness. There is no rebound and no guarding.  Musculoskeletal: She exhibits no edema  or tenderness.  Neurological: She is alert.  Skin: She is not diaphoretic.  Psychiatric: She has a normal mood and affect. Her behavior is normal.  Nursing note and vitals reviewed.   ED Course  Procedures (including critical care time) Labs Review Labs Reviewed  CBC - Abnormal; Notable for the following:    Hemoglobin 11.5 (*)    HCT 35.4 (*)    MCV 74.5 (*)    MCH 24.2 (*)    All other components within normal limits  COMPREHENSIVE METABOLIC PANEL - Abnormal; Notable for the following:    Glucose, Bld 166 (*)    Albumin 3.3 (*)    Total Bilirubin 0.2 (*)    All other components within normal limits  CBG MONITORING, ED - Abnormal; Notable for the following:    Glucose-Capillary 147 (*)    All other components within normal limits  URINALYSIS, ROUTINE W REFLEX MICROSCOPIC (NOT AT [facility])  POC URINE PREG, ED    Imaging Review Dg Chest 2 View  10/19/2014   CLINICAL DATA:  Acute onset of shortness of breath. Recent bronchitis. Initial encounter.  EXAM: CHEST  2 VIEW  COMPARISON:  Chest radiograph performed 09/26/2014  FINDINGS: The lungs are well-aerated and clear. There is no evidence of focal opacification, pleural effusion or pneumothorax.  The heart is normal in size; the mediastinal contour is within normal limits. No acute osseous abnormalities are seen.  IMPRESSION: No acute cardiopulmonary process seen.   Electronically Signed   By: Roanna Raider M.D.   On: 10/19/2014 23:12     EKG Interpretation None      MDM   Final diagnoses:  Bronchitis    Afebrile, nontoxic patient with bronchitis, with symptoms x 3 weeks.  Pt is a diabetic and has hx asthma.  Given prolonged illness and comorbidities, will treat with antibiotic, prednisone, refills of albuterol.   D/C home with PCP follow up.  Advised to closely monitor blood glucose, especially given prednisone Rx, take metformin as directed.  Pt offered cough medication but she declined.  Discussed result, findings, treatment,  and follow up  with patient.  Pt given return precautions.  Pt verbalizes understanding and agrees with plan.         Trixie Dredge, PA-C 10/20/14 0005  Blake Divine, MD 10/20/14 [phone]

## 2014-10-20 LAB — URINALYSIS, ROUTINE W REFLEX MICROSCOPIC
Bilirubin Urine: NEGATIVE
GLUCOSE, UA: NEGATIVE mg/dL
Hgb urine dipstick: NEGATIVE
Ketones, ur: NEGATIVE mg/dL
Leukocytes, UA: NEGATIVE
Nitrite: NEGATIVE
Protein, ur: NEGATIVE mg/dL
Specific Gravity, Urine: 1.011 (ref 1.005–1.030)
Urobilinogen, UA: 0.2 mg/dL (ref 0.0–1.0)
pH: 5.5 (ref 5.0–8.0)

## 2014-10-22 ENCOUNTER — Encounter (HOSPITAL_COMMUNITY): Payer: Self-pay | Admitting: *Deleted

## 2014-10-22 ENCOUNTER — Emergency Department (HOSPITAL_COMMUNITY)
Admission: EM | Admit: 2014-10-22 | Discharge: 2014-10-22 | Disposition: A | Discharge status: Home / Self Care | Payer: Self-pay | Attending: Emergency Medicine | Admitting: Emergency Medicine

## 2014-10-22 DIAGNOSIS — Z7952 Long term (current) use of systemic steroids: Secondary | ICD-10-CM | POA: Insufficient documentation

## 2014-10-22 DIAGNOSIS — R202 Paresthesia of skin: Secondary | ICD-10-CM

## 2014-10-22 DIAGNOSIS — Z79899 Other long term (current) drug therapy: Secondary | ICD-10-CM | POA: Insufficient documentation

## 2014-10-22 DIAGNOSIS — Z792 Long term (current) use of antibiotics: Secondary | ICD-10-CM | POA: Insufficient documentation

## 2014-10-22 DIAGNOSIS — E119 Type 2 diabetes mellitus without complications: Secondary | ICD-10-CM | POA: Insufficient documentation

## 2014-10-22 DIAGNOSIS — R0602 Shortness of breath: Secondary | ICD-10-CM | POA: Insufficient documentation

## 2014-10-22 DIAGNOSIS — E669 Obesity, unspecified: Secondary | ICD-10-CM | POA: Insufficient documentation

## 2014-10-22 DIAGNOSIS — R0789 Other chest pain: Secondary | ICD-10-CM

## 2014-10-22 DIAGNOSIS — R55 Syncope and collapse: Secondary | ICD-10-CM

## 2014-10-22 LAB — I-STAT CHEM 8, ED
BUN: 11 mg/dL (ref 6–20)
CALCIUM ION: 1.2 mmol/L (ref 1.12–1.23)
Chloride: 102 mmol/L (ref 101–111)
Creatinine, Ser: 0.8 mg/dL (ref 0.44–1.00)
Glucose, Bld: 179 mg/dL — ABNORMAL HIGH (ref 65–99)
HCT: 39 % (ref 36.0–46.0)
Hemoglobin: 13.3 g/dL (ref 12.0–15.0)
POTASSIUM: 3.3 mmol/L — AB (ref 3.5–5.1)
SODIUM: 139 mmol/L (ref 135–145)
TCO2: 23 mmol/L (ref 0–100)

## 2014-10-22 LAB — I-STAT TROPONIN, ED: Troponin i, poc: 0.01 ng/mL (ref 0.00–0.08)

## 2014-10-22 NOTE — ED Notes (Signed)
Pt escorted to discharge window. Pt verbalized understanding discharge instructions. In no acute distress.  

## 2014-10-22 NOTE — ED Notes (Signed)
Bed: WA02 Expected date:  Expected time:  Means of arrival:  Comments: EMS- 28yo F, chest pressure

## 2014-10-22 NOTE — Discharge Instructions (Signed)
Near-Syncope  Near-syncope (commonly known as near fainting) is sudden weakness, dizziness, or feeling like you might pass out. During an episode of near-syncope, you may also develop pale skin, have tunnel vision, or feel sick to your stomach (nauseous). Near-syncope may occur when getting up after sitting or while standing for a long time. It is caused by a sudden decrease in blood flow to the brain. This decrease can result from various causes or triggers, most of which are not serious. However, because near-syncope can sometimes be a sign of something serious, a medical evaluation is required. The specific cause is often not determined.  HOME CARE INSTRUCTIONS   Monitor your condition for any changes. The following actions may help to alleviate any discomfort you are experiencing:   Have someone stay with you until you feel stable.   Lie down right away and prop your feet up if you start feeling like you might faint. Breathe deeply and steadily. Wait until all the symptoms have passed. Most of these episodes last only a few minutes. You may feel tired for several hours.    Drink enough fluids to keep your urine clear or pale yellow.    If you are taking blood pressure or heart medicine, get up slowly when seated or lying down. Take several minutes to sit and then stand. This can reduce dizziness.   Follow up with your health care provider as directed.  SEEK IMMEDIATE MEDICAL CARE IF:    You have a severe headache.    You have unusual pain in the chest, abdomen, or back.    You are bleeding from the mouth or rectum, or you have black or tarry stool.    You have an irregular or very fast heartbeat.    You have repeated fainting or have seizure-like jerking during an episode.    You faint when sitting or lying down.    You have confusion.    You have difficulty walking.    You have severe weakness.    You have vision problems.   MAKE SURE YOU:    Understand these instructions.   Will  watch your condition.   Will get help right away if you are not doing well or get worse.  Document Released: 04/24/2005 Document Revised: 04/29/2013 Document Reviewed: 09/27/2012  Exit[employer] Patient Information 2015 [employer]. This information is not intended to replace advice given to you by your health care provider. Make sure you discuss any questions you have with your health care provider.  Chest Pain (Nonspecific)  It is often hard to give a specific diagnosis for the cause of chest pain. There is always a chance that your pain could be related to something serious, such as a heart attack or a blood clot in the lungs. You need to follow up with your health care provider for further evaluation.  CAUSES    Heartburn.   Pneumonia or bronchitis.   Anxiety or stress.   Inflammation around your heart (pericarditis) or lung (pleuritis or pleurisy).   A blood clot in the lung.   A collapsed lung (pneumothorax). It can develop suddenly on its own (spontaneous pneumothorax) or from trauma to the chest.   Shingles infection (herpes zoster virus).  The chest wall is composed of bones, muscles, and cartilage. Any of these can be the source of the pain.   The bones can be bruised by injury.   The muscles or cartilage can be strained by coughing or overwork.   The   cartilage can be affected by inflammation and become sore (costochondritis).  DIAGNOSIS   Lab tests or other studies may be needed to find the cause of your pain. Your health care provider may have you take a test called an ambulatory electrocardiogram (ECG). An ECG records your heartbeat patterns over a 24-hour period. You may also have other tests, such as:   Transthoracic echocardiogram (TTE). During echocardiography, sound waves are used to evaluate how blood flows through your heart.   Transesophageal echocardiogram (TEE).   Cardiac monitoring. This allows your health care provider to monitor your heart rate and rhythm in real time.   Holter  monitor. This is a portable device that records your heartbeat and can help diagnose heart arrhythmias. It allows your health care provider to track your heart activity for several days, if needed.   Stress tests by exercise or by giving medicine that makes the heart beat faster.  TREATMENT    Treatment depends on what may be causing your chest pain. Treatment may include:   Acid blockers for heartburn.   Anti-inflammatory medicine.   Pain medicine for inflammatory conditions.   Antibiotics if an infection is present.   You may be advised to change lifestyle habits. This includes stopping smoking and avoiding alcohol, caffeine, and chocolate.   You may be advised to keep your head raised (elevated) when sleeping. This reduces the chance of acid going backward from your stomach into your esophagus.  Most of the time, nonspecific chest pain will improve within 2-3 days with rest and mild pain medicine.   HOME CARE INSTRUCTIONS    If antibiotics were prescribed, take them as directed. Finish them even if you start to feel better.   For the next few days, avoid physical activities that bring on chest pain. Continue physical activities as directed.   Do not use any tobacco products, including cigarettes, chewing tobacco, or electronic cigarettes.   Avoid drinking alcohol.   Only take medicine as directed by your health care provider.   Follow your health care provider's suggestions for further testing if your chest pain does not go away.   Keep any follow-up appointments you made. If you do not go to an appointment, you could develop lasting (chronic) problems with pain. If there is any problem keeping an appointment, call to reschedule.  SEEK MEDICAL CARE IF:    Your chest pain does not go away, even after treatment.   You have a rash with blisters on your chest.   You have a fever.  SEEK IMMEDIATE MEDICAL CARE IF:    You have increased chest pain or pain that spreads to your arm, neck, jaw, back, or  abdomen.   You have shortness of breath.   You have an increasing cough, or you cough up blood.   You have severe back or abdominal pain.   You feel nauseous or vomit.   You have severe weakness.   You faint.   You have chills.  This is an emergency. Do not wait to see if the pain will go away. Get medical help at once. Call your local emergency services (911 in [place] Do not drive yourself to the hospital.  MAKE SURE YOU:    Understand these instructions.   Will watch your condition.   Will get help right away if you are not doing well or get worse.  Document Released: 02/01/2005 Document Revised: 04/29/2013 Document Reviewed: 11/28/2007  [employer] Patient Information 2015 [employer]. This information is not   intended to replace advice given to you by your health care provider. Make sure you discuss any questions you have with your health care provider.

## 2014-10-22 NOTE — ED Notes (Signed)
Per EMS pt from home with c/o generalized chest pain/chest numbness, seen in the emergency department for same twice in the last two weeks, was diagnosed with bronchitis and prescribed antibiotics. Pt sts today she went to water park and afterwards developed chest pain.

## 2014-10-22 NOTE — ED Provider Notes (Signed)
CSN: [id number]     Arrival date & time 10/22/14  1412 History   First MD Initiated Contact with Patient 10/22/14 1452     Chief Complaint  Patient presents with  . Chest Pain  . Bronchitis     (Consider location/radiation/quality/duration/timing/severity/associated sxs/prior Treatment) Patient is a 29 y.o. female presenting with chest pain. The history is provided by the patient. No language interpreter was used.  Chest Pain Pain location:  L chest Pain quality: pressure   Pain radiates to:  Does not radiate Pain radiates to the back: no   Pain severity:  Moderate Onset quality:  Sudden Duration:  15 minutes Timing:  Intermittent Progression:  Waxing and waning Chronicity:  New (over past 2 weeks) Context: at rest   Context comment:  Today after being in the sun Relieved by:  Rest Worsened by:  Nothing tried Ineffective treatments:  None tried Associated symptoms: near-syncope and shortness of breath   Associated symptoms: no abdominal pain, no back pain, no cough, no diaphoresis, no fatigue, no fever, no headache, no nausea, no numbness, no palpitations, not vomiting and no weakness   Associated symptoms comment:  L arm, L leg paresthesias Risk factors: diabetes mellitus and obesity   Risk factors: no high cholesterol, no hypertension, not female, not pregnant, no prior DVT/PE, no smoking and no surgery     Past Medical History  Diagnosis Date  . Asthma   . Obesity   . Diabetes mellitus without complication    Past Surgical History  Procedure Laterality Date  . Tubal ligation    . Cesarean section     Family History  Problem Relation Age of Onset  . Diabetes Maternal Aunt    History  Substance Use Topics  . Smoking status: Never Smoker   . Smokeless tobacco: Not on file  . Alcohol Use: No   OB History    No data available     Review of Systems  Constitutional: Negative for fever, chills, diaphoresis, activity change, appetite change and fatigue.  HENT:  Negative for congestion, facial swelling, rhinorrhea and sore throat.   Eyes: Negative for photophobia and discharge.  Respiratory: Positive for shortness of breath. Negative for cough and chest tightness.   Cardiovascular: Positive for chest pain and near-syncope. Negative for palpitations and leg swelling.  Gastrointestinal: Negative for nausea, vomiting, abdominal pain and diarrhea.  Endocrine: Negative for polydipsia and polyuria.  Genitourinary: Negative for dysuria, frequency, difficulty urinating and pelvic pain.  Musculoskeletal: Negative for back pain, arthralgias, neck pain and neck stiffness.  Skin: Negative for color change and wound.  Allergic/Immunologic: Negative for immunocompromised state.  Neurological: Negative for facial asymmetry, weakness, numbness and headaches.  Hematological: Does not bruise/bleed easily.  Psychiatric/Behavioral: Negative for confusion and agitation.      Allergies  Other  Home Medications   Prior to Admission medications   Medication Sig Start Date End Date Taking? Authorizing Provider  albuterol (PROVENTIL HFA;VENTOLIN HFA) 108 (90 BASE) MCG/ACT inhaler Inhale 2 puffs into the lungs every 6 (six) hours as needed for wheezing or shortness of breath. 07/18/14  Yes Jennifer Piepenbrink, PA-C  albuterol (PROVENTIL) (2.5 MG/3ML) 0.083% nebulizer solution Take 3 mLs (2.5 mg total) by nebulization every 4 (four) hours as needed for wheezing or shortness of breath. 10/19/14  Yes Trixie Dredge, PA-C  azithromycin (ZITHROMAX) 250 MG tablet Take 1 tablet (250 mg total) by mouth daily. Take first 2 tablets together, then 1 every day until finished. 10/19/14  Yes Trixie Dredge,  PA-C  ibuprofen (ADVIL,MOTRIN) 200 MG tablet Take 200 mg by mouth every 6 (six) hours as needed for mild pain.   Yes Historical Provider, MD  metFORMIN (GLUCOPHAGE) 500 MG tablet Take 1 tablet (500 mg total) by mouth 2 (two) times daily with a meal. 10/16/13  Yes Olugbemiga E Hyman Hopes, MD   predniSONE (DELTASONE) 20 MG tablet Take 2 tablets (40 mg total) by mouth daily with breakfast. Patient taking differently: Take 40 mg by mouth daily as needed (for help breathing).  10/19/14  Yes Trixie Dredge, PA-C  guaiFENesin (ROBITUSSIN) 100 MG/5ML liquid Take 5-10 mLs (100-200 mg total) by mouth every 4 (four) hours as needed for cough. Patient not taking: Reported on 10/22/2014 07/18/14   Francee Piccolo, PA-C   BP 119/64 mmHg  Pulse 65  Temp(Src) 98.2 F (36.8 C) (Oral)  Resp 21  SpO2 100%  LMP 10/07/2014 Physical Exam  Constitutional: She is oriented to person, place, and time. She appears well-developed and well-nourished. No distress.  HENT:  Head: Normocephalic and atraumatic.  Mouth/Throat: No oropharyngeal exudate.  Eyes: Pupils are equal, round, and reactive to light.  Neck: Normal range of motion. Neck supple.  Cardiovascular: Normal rate, regular rhythm and normal heart sounds.  Exam reveals no gallop and no friction rub.   No murmur heard. Pulmonary/Chest: Effort normal and breath sounds normal. No respiratory distress. She has no wheezes. She has no rales.  Abdominal: Soft. Bowel sounds are normal. She exhibits no distension and no mass. There is no tenderness. There is no rebound and no guarding.  Musculoskeletal: Normal range of motion. She exhibits no edema or tenderness.  Neurological: She is alert and oriented to person, place, and time. She has normal strength. She displays no atrophy and no tremor. No cranial nerve deficit or sensory deficit. She exhibits normal muscle tone. She displays a negative Romberg sign. Coordination and gait normal. GCS eye subscore is 4. GCS verbal subscore is 5. GCS motor subscore is 6.  Skin: Skin is warm and dry.  Psychiatric: She has a normal mood and affect.    ED Course  Procedures (including critical care time) Labs Review Labs Reviewed  I-STAT CHEM 8, ED - Abnormal; Notable for the following:    Potassium 3.3 (*)     Glucose, Bld 179 (*)    All other components within normal limits  I-STAT TROPOININ, ED    Imaging Review No results found.   EKG Interpretation   Date/Time:  Thursday October 22 2014 15:25:22 EDT Ventricular Rate:  71 PR Interval:  154 QRS Duration: 72 QT Interval:  380 QTC Calculation: 413 R Axis:   60 Text Interpretation:  Sinus rhythm Isolated TWI lead III,  Unchanged from  prior Confirmed by DOCHERTY  MD, MEGAN ([id number]) on 10/22/2014 4:01:01 PM     CLINICAL DATA: Acute onset of shortness of breath. Recent bronchitis. Initial encounter.  EXAM: CHEST 2 VIEW  COMPARISON: Chest radiograph performed 09/26/2014  FINDINGS: The lungs are well-aerated and clear. There is no evidence of focal opacification, pleural effusion or pneumothorax.  The heart is normal in size; the mediastinal contour is within normal limits. No acute osseous abnormalities are seen.  IMPRESSION: No acute cardiopulmonary process seen.   Electronically Signed  By: Roanna Raider M.D.  On: 10/19/2014 23:12  MDM   Final diagnoses:  Atypical chest pain  Near syncope  Paresthesias    Pt is a 29 y.o. female with Pmhx as above who presents with continued intermittent L sided  chest pressure with assoc SOB. Today she had an episode after being in the sun at a water park where she had sudden onset chest pressure, shortness of breath, near syncopal sensation and paresthesias of left arm and left leg that lasted about 10 minutes.  She denies associated weakness.  She states she has had 5 or 6 similar symptoms last couple weeks.  All preceded by episode of chest pain.  She was seen 2 days ago, diagnosed with bronchitis and put on azithromycin and prednisone.  She was also seen on the 21st for similar symptoms.  On physical exam, vital signs are stable.  She is in no acute distress.  Cardiopulmonary exam is benign.  Neurologic exam is benign.PERC negative, TIM score 0. ,  EKG i-STAT Chem-8 and i-STAT  troponin  nml.  CXR 2 days ago was nml, does not need to be repeated given nml lung exam today. I doubt ACS, PE, CVA, TIA. Pt not having active pain or active neuro symptoms currently    Kelly Splinter evaluation in the Emergency Department is complete. It has been determined that no acute conditions requiring further emergency intervention are present at this time. The patient/guardian have been advised of the diagnosis and plan. We have discussed signs and symptoms that warrant return to the ED, such as changes or worsening in symptoms, worsening pain, numbness, weakness, fever, leg pain/swelling.       Toy Cookey, MD 10/23/14 [phone]

## 2014-11-27 ENCOUNTER — Encounter (HOSPITAL_COMMUNITY): Payer: Self-pay | Admitting: Emergency Medicine

## 2014-11-27 ENCOUNTER — Emergency Department (HOSPITAL_COMMUNITY)
Admission: EM | Admit: 2014-11-27 | Discharge: 2014-11-27 | Disposition: A | Discharge status: Home / Self Care | Payer: Self-pay | Attending: Emergency Medicine | Admitting: Emergency Medicine

## 2014-11-27 DIAGNOSIS — Z79899 Other long term (current) drug therapy: Secondary | ICD-10-CM | POA: Insufficient documentation

## 2014-11-27 DIAGNOSIS — M79604 Pain in right leg: Secondary | ICD-10-CM | POA: Insufficient documentation

## 2014-11-27 DIAGNOSIS — M79671 Pain in right foot: Secondary | ICD-10-CM | POA: Insufficient documentation

## 2014-11-27 DIAGNOSIS — M549 Dorsalgia, unspecified: Secondary | ICD-10-CM

## 2014-11-27 DIAGNOSIS — E119 Type 2 diabetes mellitus without complications: Secondary | ICD-10-CM | POA: Insufficient documentation

## 2014-11-27 DIAGNOSIS — E669 Obesity, unspecified: Secondary | ICD-10-CM | POA: Insufficient documentation

## 2014-11-27 DIAGNOSIS — Z792 Long term (current) use of antibiotics: Secondary | ICD-10-CM | POA: Insufficient documentation

## 2014-11-27 DIAGNOSIS — M79605 Pain in left leg: Secondary | ICD-10-CM | POA: Insufficient documentation

## 2014-11-27 DIAGNOSIS — Z7952 Long term (current) use of systemic steroids: Secondary | ICD-10-CM | POA: Insufficient documentation

## 2014-11-27 DIAGNOSIS — M79606 Pain in leg, unspecified: Secondary | ICD-10-CM

## 2014-11-27 DIAGNOSIS — M545 Low back pain: Secondary | ICD-10-CM | POA: Insufficient documentation

## 2014-11-27 DIAGNOSIS — J45909 Unspecified asthma, uncomplicated: Secondary | ICD-10-CM | POA: Insufficient documentation

## 2014-11-27 DIAGNOSIS — M79672 Pain in left foot: Secondary | ICD-10-CM | POA: Insufficient documentation

## 2014-11-27 MED ORDER — NAPROXEN 500 MG PO TABS: 500.0000 mg | ORAL_TABLET | Freq: Two times a day (BID) | ORAL | Status: DC

## 2014-11-27 MED ORDER — METHOCARBAMOL 500 MG PO TABS: 500.0000 mg | ORAL_TABLET | Freq: Two times a day (BID) | ORAL | Status: DC

## 2014-11-27 NOTE — Discharge Instructions (Signed)
Take the prescribed medication as directed. °Follow-up with your primary care physician. °Return to the ED for new or worsening symptoms. ° °

## 2014-11-27 NOTE — ED Notes (Signed)
Declined W/C at D/C and was escorted to lobby by RN. 

## 2014-11-27 NOTE — ED Notes (Signed)
Pt. Stated, I've been having pain in my legs, feet and back for about 2 days.

## 2014-11-27 NOTE — ED Provider Notes (Signed)
CSN: [id number]     Arrival date & time 11/27/14  1308 History  This chart was scribed for non-physician practitioner, Garlon Hatchet, PA-C working with Doug Sou, MD by Placido Sou, ED scribe. This patient was seen in room TR05C/TR05C and the patient's care was started at 2:00 PM.  Chief Complaint  Patient presents with  . Leg Pain  . Foot Pain  . Back Pain   The history is provided by the patient. No language interpreter was used.    HPI Comments: [name] is a 29 y.o. female, with a hx of DM and obesity, who presents to the Emergency Department complaining of constant, mild, pain to her lower back, bilateral legs and right foot with onset 2 days ago. Pt notes that the pain began on the bottom of her right foot, spread to her lower back and then began radiating down her legs. Pt notes recently beginning to walk daily for exercise and further explains that she does so in supportive tennis shoes.  She denies any other associated symptoms.  No numbness or weakness of lower extremities.  No bowel or bladder incontinence.  No hx of back issues.  No intervention tried PTA.  Past Medical History  Diagnosis Date  . Asthma   . Obesity   . Diabetes mellitus without complication    Past Surgical History  Procedure Laterality Date  . Tubal ligation    . Cesarean section     Family History  Problem Relation Age of Onset  . Diabetes Maternal Aunt    History  Substance Use Topics  . Smoking status: Never Smoker   . Smokeless tobacco: Not on file  . Alcohol Use: No   OB History    No data available     Review of Systems  Constitutional: Negative for appetite change.  Gastrointestinal: Negative for nausea and vomiting.  Musculoskeletal: Positive for myalgias and back pain.  All other systems reviewed and are negative.  Allergies  Other  Home Medications   Prior to Admission medications   Medication Sig Start Date End Date Taking? Authorizing Provider  albuterol  (PROVENTIL HFA;VENTOLIN HFA) 108 (90 BASE) MCG/ACT inhaler Inhale 2 puffs into the lungs every 6 (six) hours as needed for wheezing or shortness of breath. 07/18/14   Jennifer Piepenbrink, PA-C  albuterol (PROVENTIL) (2.5 MG/3ML) 0.083% nebulizer solution Take 3 mLs (2.5 mg total) by nebulization every 4 (four) hours as needed for wheezing or shortness of breath. 10/19/14   Trixie Dredge, PA-C  azithromycin (ZITHROMAX) 250 MG tablet Take 1 tablet (250 mg total) by mouth daily. Take first 2 tablets together, then 1 every day until finished. 10/19/14   Trixie Dredge, PA-C  guaiFENesin (ROBITUSSIN) 100 MG/5ML liquid Take 5-10 mLs (100-200 mg total) by mouth every 4 (four) hours as needed for cough. Patient not taking: Reported on 10/22/2014 07/18/14   Francee Piccolo, PA-C  ibuprofen (ADVIL,MOTRIN) 200 MG tablet Take 200 mg by mouth every 6 (six) hours as needed for mild pain.    Historical Provider, MD  metFORMIN (GLUCOPHAGE) 500 MG tablet Take 1 tablet (500 mg total) by mouth 2 (two) times daily with a meal. 10/16/13   Quentin Angst, MD  predniSONE (DELTASONE) 20 MG tablet Take 2 tablets (40 mg total) by mouth daily with breakfast. Patient taking differently: Take 40 mg by mouth daily as needed (for help breathing).  10/19/14   Trixie Dredge, PA-C   BP 134/78 mmHg  Pulse 62  Temp(Src) 98.1 F (  36.7 C) (Oral)  Resp 14  SpO2 100%  LMP 11/06/2014   Physical Exam  Constitutional: She is oriented to person, place, and time. She appears well-developed and well-nourished.  HENT:  Head: Normocephalic and atraumatic.  Mouth/Throat: Oropharynx is clear and moist.  Eyes: Conjunctivae and EOM are normal. Pupils are equal, round, and reactive to light.  Neck: Normal range of motion.  Cardiovascular: Normal rate, regular rhythm and normal heart sounds.   Pulmonary/Chest: Effort normal and breath sounds normal. No respiratory distress. She has no wheezes.  Abdominal: Soft. Bowel sounds are normal.   Musculoskeletal: Normal range of motion.       Lumbar back: Normal.  Endorses pain of lower back, bilateral leg, and bilateral heels, however there is no focal tenderness to palpation; she has full ROM of back and both legs without difficulty; no overlying skin changes or swelling; normal strength and sensation of BLE; normal gait  Neurological: She is alert and oriented to person, place, and time.  Skin: Skin is warm and dry.  Psychiatric: She has a normal mood and affect.  Nursing note and vitals reviewed.  ED Course  Procedures  DIAGNOSTIC STUDIES: Oxygen Saturation is 100% on RA, normal by my interpretation.    COORDINATION OF CARE: 2:06 PM Discussed treatment plan with pt at bedside and pt agreed to plan.  Labs Review Labs Reviewed - No data to display  Imaging Review No results found.   EKG Interpretation None      MDM   Final diagnoses:  Back pain, unspecified location  Pain of lower extremity, unspecified laterality  Foot pain, bilateral   29 year old female here with atraumatic low back pain, bilateral leg pain, and bilateral foot pain. She recently started walking for exercise. Patient has no focal neurologic deficits on exam to suggest cauda equina. She endorses generalized pain of affected areas, no focal tenderness noted on exam. Suspect symptoms are musculoskeletal from her new exercise regimen. Do not feel that imaging is needed at this time. Patient discharged home with supportive care. She is to follow-up with her PCP.  Discussed plan with patient, he/she acknowledged understanding and agreed with plan of care.  Return precautions given for new or worsening symptoms.  I personally performed the services described in this documentation, which was scribed in my presence. The recorded information has been reviewed and is accurate.  Garlon Hatchet, PA-C 11/27/14 1508  Doug Sou, MD 11/27/14 [phone]

## 2015-07-29 ENCOUNTER — Emergency Department (HOSPITAL_COMMUNITY)
Admission: EM | Admit: 2015-07-29 | Discharge: 2015-07-29 | Disposition: A | Discharge status: Home / Self Care | Payer: Medicaid Other | Attending: Emergency Medicine | Admitting: Emergency Medicine

## 2015-07-29 DIAGNOSIS — Y9389 Activity, other specified: Secondary | ICD-10-CM | POA: Insufficient documentation

## 2015-07-29 DIAGNOSIS — E669 Obesity, unspecified: Secondary | ICD-10-CM | POA: Insufficient documentation

## 2015-07-29 DIAGNOSIS — J45909 Unspecified asthma, uncomplicated: Secondary | ICD-10-CM | POA: Insufficient documentation

## 2015-07-29 DIAGNOSIS — Y998 Other external cause status: Secondary | ICD-10-CM | POA: Insufficient documentation

## 2015-07-29 DIAGNOSIS — S0501XA Injury of conjunctiva and corneal abrasion without foreign body, right eye, initial encounter: Secondary | ICD-10-CM

## 2015-07-29 DIAGNOSIS — Y9289 Other specified places as the place of occurrence of the external cause: Secondary | ICD-10-CM | POA: Insufficient documentation

## 2015-07-29 DIAGNOSIS — E119 Type 2 diabetes mellitus without complications: Secondary | ICD-10-CM | POA: Insufficient documentation

## 2015-07-29 DIAGNOSIS — X58XXXA Exposure to other specified factors, initial encounter: Secondary | ICD-10-CM | POA: Insufficient documentation

## 2015-07-29 MED ORDER — TETRACAINE HCL 0.5 % OP SOLN
1.0000 [drp] | Freq: Once | OPHTHALMIC | Status: AC
  Administered 2015-07-29: 1 [drp] via OPHTHALMIC
  Filled 2015-07-29: qty 2

## 2015-07-29 MED ORDER — FLUORESCEIN SODIUM 1 MG OP STRP
1.0000 | ORAL_STRIP | Freq: Once | OPHTHALMIC | Status: AC
Start: 2015-07-29 — End: 2015-07-29
  Administered 2015-07-29: 1 via OPHTHALMIC
  Filled 2015-07-29: qty 1

## 2015-07-29 MED ORDER — ERYTHROMYCIN 5 MG/GM OP OINT: 1.0000 "application " | TOPICAL_OINTMENT | Freq: Four times a day (QID) | OPHTHALMIC | Status: DC

## 2015-07-29 MED ORDER — ERYTHROMYCIN 5 MG/GM OP OINT
1.0000 "application " | TOPICAL_OINTMENT | Freq: Once | OPHTHALMIC | Status: AC
  Administered 2015-07-29: 1 via OPHTHALMIC
  Filled 2015-07-29: qty 3.5

## 2015-07-29 NOTE — ED Notes (Signed)
MD at bedside. 

## 2015-07-29 NOTE — ED Notes (Signed)
Pt reports getting hit in right eye by metal object "my daughter was swinging around". States it has become increasingly red and irritated, as well as tearing. Reports vision is "a little blurry".

## 2015-07-29 NOTE — ED Provider Notes (Signed)
CSN: [id number]     Arrival date & time 07/29/15  0505 History   First MD Initiated Contact with Patient 07/29/15 [phone]     Chief Complaint  Patient presents with  . Eye Pain     (Consider location/radiation/quality/duration/timing/severity/associated sxs/prior Treatment) Patient is a 30 y.o. female presenting with eye pain.  Eye Pain This is a new problem. The current episode started 2 days ago. The problem occurs constantly. The problem has been gradually worsening. Nothing aggravates the symptoms. Nothing relieves the symptoms. She has tried nothing for the symptoms. The treatment provided no relief.    Past Medical History  Diagnosis Date  . Asthma   . Obesity   . Diabetes mellitus without complication    Past Surgical History  Procedure Laterality Date  . Tubal ligation    . Cesarean section     Family History  Problem Relation Age of Onset  . Diabetes Maternal Aunt    Social History  Substance Use Topics  . Smoking status: Never Smoker   . Smokeless tobacco: Not on file  . Alcohol Use: No   OB History    No data available     Review of Systems  Constitutional: Negative for fever and fatigue.  Eyes: Positive for pain. Negative for photophobia and discharge.  Endocrine: Negative for heat intolerance, polydipsia and polyphagia.  All other systems reviewed and are negative.     Allergies  Other  Home Medications   Prior to Admission medications   Medication Sig Start Date End Date Taking? Authorizing Provider  erythromycin ophthalmic ointment Place 1 application into the right eye 4 (four) times daily. For seven days 07/29/15   Marily MemosJason Bianka Liberati, MD   BP 134/78 mmHg  Pulse 74  Temp(Src) 98 F (36.7 C) (Oral)  Resp 16  Ht 5\' 2"  (1.575 m)  Wt 194 lb (87.998 kg)  BMI 35.47 kg/m2  SpO2 100%  LMP 07/15/2015 (Exact Date) Physical Exam  Constitutional: She appears well-developed and well-nourished.  HENT:  Head: Normocephalic and atraumatic.  Eyes: Right  conjunctiva is injected. Right conjunctiva has no hemorrhage. Right eye exhibits normal extraocular motion and no nystagmus. Right pupil is round and reactive. Left pupil is reactive.  Small fluoroscein uptake on cornea without obvious foreign body in lids  Neck: Normal range of motion.  Cardiovascular: Normal rate and regular rhythm.   Pulmonary/Chest: No stridor. No respiratory distress.  Abdominal: She exhibits no distension.  Musculoskeletal: Normal range of motion. She exhibits no edema or tenderness.  Neurological: She is alert.  Nursing note and vitals reviewed.   ED Course  Procedures (including critical care time) Labs Review Labs Reviewed - No data to display  Imaging Review No results found. I have personally reviewed and evaluated these images and lab results as part of my medical decision-making.   EKG Interpretation None      MDM   Final diagnoses:  Corneal abrasion, right, initial encounter    Likely corneal abrasion. Will tx with erythromycin eye ointment and ophthalmology follow up.     Marily MemosJason Germaine Ripp, MD 07/29/15 [phone]

## 2015-07-29 NOTE — ED Notes (Signed)
Pt reports understanding d/c instructions.

## 2015-11-24 ENCOUNTER — Emergency Department (HOSPITAL_COMMUNITY)
Admission: EM | Admit: 2015-11-24 | Discharge: 2015-11-24 | Disposition: A | Discharge status: Home / Self Care | Payer: Self-pay | Attending: Emergency Medicine | Admitting: Emergency Medicine

## 2015-11-24 ENCOUNTER — Encounter (HOSPITAL_COMMUNITY): Payer: Self-pay | Admitting: Family Medicine

## 2015-11-24 DIAGNOSIS — J45909 Unspecified asthma, uncomplicated: Secondary | ICD-10-CM | POA: Insufficient documentation

## 2015-11-24 DIAGNOSIS — E1165 Type 2 diabetes mellitus with hyperglycemia: Secondary | ICD-10-CM | POA: Insufficient documentation

## 2015-11-24 LAB — CBG MONITORING, ED: Glucose-Capillary: 264 mg/dL — ABNORMAL HIGH (ref 65–99)

## 2015-11-24 LAB — BASIC METABOLIC PANEL
Anion gap: 5 (ref 5–15)
BUN: 12 mg/dL (ref 6–20)
CALCIUM: 9.3 mg/dL (ref 8.9–10.3)
CO2: 26 mmol/L (ref 22–32)
CREATININE: 0.8 mg/dL (ref 0.44–1.00)
Chloride: 103 mmol/L (ref 101–111)
GFR calc non Af Amer: >60 mL/min (ref >60)
GLUCOSE: 278 mg/dL — AB (ref 65–99)
Potassium: 4.5 mmol/L (ref 3.5–5.1)
Sodium: 134 mmol/L — ABNORMAL LOW (ref 135–145)

## 2015-11-24 LAB — CBC
HCT: 38.3 % (ref 36.0–46.0)
Hemoglobin: 12.6 g/dL (ref 12.0–15.0)
MCH: 24.8 pg — AB (ref 26.0–34.0)
MCHC: 32.9 g/dL (ref 30.0–36.0)
MCV: 75.2 fL — ABNORMAL LOW (ref 78.0–100.0)
PLATELETS: 225 10*3/uL (ref 150–400)
RBC: 5.09 MIL/uL (ref 3.87–5.11)
RDW: 13.4 % (ref 11.5–15.5)
WBC: 7.4 10*3/uL (ref 4.0–10.5)

## 2015-11-24 LAB — URINE MICROSCOPIC-ADD ON

## 2015-11-24 LAB — URINALYSIS, ROUTINE W REFLEX MICROSCOPIC
BILIRUBIN URINE: NEGATIVE
HGB URINE DIPSTICK: NEGATIVE
Ketones, ur: 15 mg/dL — AB
Leukocytes, UA: NEGATIVE
Nitrite: NEGATIVE
PROTEIN: NEGATIVE mg/dL
Specific Gravity, Urine: 1.031 — ABNORMAL HIGH (ref 1.005–1.030)
pH: 5 (ref 5.0–8.0)

## 2015-11-24 LAB — PREGNANCY, URINE: Preg Test, Ur: NEGATIVE

## 2015-11-24 MED ORDER — METFORMIN HCL ER 500 MG PO TB24: 500.0000 mg | ORAL_TABLET | Freq: Every day | ORAL | Status: DC

## 2015-11-24 MED ORDER — SODIUM CHLORIDE 0.9 % IV BOLUS (SEPSIS)
1000.0000 mL | Freq: Once | INTRAVENOUS | Status: AC
  Administered 2015-11-24: 1000 mL via INTRAVENOUS

## 2015-11-24 MED ORDER — METFORMIN HCL 500 MG PO TABS
500.0000 mg | ORAL_TABLET | Freq: Once | ORAL | Status: AC
  Administered 2015-11-24: 500 mg via ORAL
  Filled 2015-11-24: qty 1

## 2015-11-24 NOTE — ED Provider Notes (Signed)
CSN: [id number]     Arrival date & time 11/24/15  1239 History   First MD Initiated Contact with Patient 11/24/15 1348     Chief Complaint  Patient presents with  . Hyperglycemia   HPI  [name] is an 30 y.o. female with history of DM who presents to the ED for evaluation of high blood sugars at home. She states she had been taking metformin  BID but stopped taking it on her own about one year ago as she was maintaining good glycemic control with her diet and exercise. She states that yesterday she started feeling lightheaded and dizzy. For "a little while she has noticed polyuria and polydipsia. She does admit to diet indiscretion lately as she has been working long hours and eating more fast food. She states she checked her CBG at home yesterday and it was 400. Today she states she feels better but still a little "off." she states she has felt this way in the past when she has had high blood sugar. Denies chest pain, shortness of breath, fever, chills, abdominal pain, n/v/d.   Past Medical History  Diagnosis Date  . Asthma   . Obesity   . Diabetes mellitus without complication [facility])    Past Surgical History  Procedure Laterality Date  . Tubal ligation    . Cesarean section     Family History  Problem Relation Age of Onset  . Diabetes Maternal Aunt    Social History  Substance Use Topics  . Smoking status: Never Smoker   . Smokeless tobacco: None  . Alcohol Use: No   OB History    No data available     Review of Systems  All other systems reviewed and are negative.     Allergies  Other  Home Medications   Prior to Admission medications   Medication Sig Start Date End Date Taking? Authorizing Provider  erythromycin ophthalmic ointment Place 1 application into the right eye 4 (four) times daily. For seven days Patient not taking: Reported on 11/24/2015 07/29/15   Marily Memos, MD   BP 115/79 mmHg  Pulse 72  Temp(Src) 97.8 F (36.6 C) (Oral)  Resp 18   Ht  (1.575 m)  Wt 88.451 kg  BMI 35.66 kg/m2  SpO2 98% Physical Exam  Constitutional: She is oriented to person, place, and time. No distress.  HENT:  Head: Atraumatic.  Right Ear: External ear normal.  Left Ear: External ear normal.  Nose: Nose normal.  Mouth/Throat: Oropharynx is clear and moist.  Eyes: Conjunctivae are normal. No scleral icterus.  Cardiovascular: Normal rate, regular rhythm and normal heart sounds.   Pulmonary/Chest: Effort normal and breath sounds normal. No respiratory distress. She has no wheezes.  Abdominal: Soft. She exhibits no distension. There is no tenderness. There is no rebound.  Musculoskeletal:  No LE edema No calf tenderness  Neurological: She is alert and oriented to person, place, and time. No cranial nerve deficit. Coordination normal.  Strength intact throughout  Skin: Skin is warm and dry. She is not diaphoretic.  Psychiatric: She has a normal mood and affect. Her behavior is normal.  Nursing note and vitals reviewed.   ED Course  Procedures (including critical care time) Labs Review Labs Reviewed  BASIC METABOLIC PANEL - Abnormal; Notable for the following:    Sodium 134 (*)    Glucose, Bld 278 (*)    All other components within normal limits  CBC - Abnormal; Notable for the following:  MCV 75.2 (*)    MCH 24.8 (*)    All other components within normal limits  URINALYSIS, ROUTINE W REFLEX MICROSCOPIC (NOT AT [facility]) - Abnormal; Notable for the following:    APPearance CLOUDY (*)    Specific Gravity, Urine 1.031 (*)    Glucose, UA >1000 (*)    Ketones, ur 15 (*)    All other components within normal limits  URINE MICROSCOPIC-ADD ON - Abnormal; Notable for the following:    Squamous Epithelial / LPF 0-5 (*)    Bacteria, UA RARE (*)    Crystals TRIPLE PHOSPHATE CRYSTALS (*)    All other components within normal limits  CBG MONITORING, ED - Abnormal; Notable for the following:    Glucose-Capillary 264 (*)    All other  components within normal limits  PREGNANCY, URINE  CBG MONITORING, ED    Imaging Review No results found. I have personally reviewed and evaluated these images and lab results as part of my medical decision-making.   EKG Interpretation None      MDM   Final diagnoses:  Type 2 diabetes mellitus with hyperglycemia, without long-term current use of insulin (HCC)    Labs today reveal glucose of 278, glucosuria, 15 ketones in urine. Pt feels improved with 1L NS bolus. She is asymptomatic after fluids. Her neuro exam is intact. Her vital signs are stable and normal. Suspect pt's symptoms are due to her hyperglycemia from uncontrolled DM and dietary indescretion. I discussed diet choices with pt including healthier restaurant/fast food options when time is limited after work. In the meantime will re-start metformin at 500mg  daily. She understands to f/u with PCP this week to re-establish diabetes monitoring. ER return precautions given.    Carlene CoriaSerena Y Eliseo Withers, PA-C 11/24/15 2137  Jacalyn LefevreJulie Haviland, MD 11/25/15 [phone]

## 2015-11-24 NOTE — Discharge Instructions (Signed)
Take metformin as prescribed. Monitor your glucose at home. Please call your primary care provider to schedule a follow up appointment this week. Return to the emergency room for new or worsening symptoms.   Diabetes Mellitus and Food It is important for you to manage your blood sugar (glucose) level. Your blood glucose level can be greatly affected by what you eat. Eating healthier foods in the appropriate amounts throughout the day at about the same time each day will help you control your blood glucose level. It can also help slow or prevent worsening of your diabetes mellitus. Healthy eating may even help you improve the level of your blood pressure and reach or maintain a healthy weight.  General recommendations for healthful eating and cooking habits include:  Eating meals and snacks regularly. Avoid going long periods of time without eating to lose weight.  Eating a diet that consists mainly of plant-based foods, such as fruits, vegetables, nuts, legumes, and whole grains.  Using low-heat cooking methods, such as baking, instead of high-heat cooking methods, such as deep frying. Work with your dietitian to make sure you understand how to use the Nutrition Facts information on food labels. HOW CAN FOOD AFFECT ME? Carbohydrates Carbohydrates affect your blood glucose level more than any other type of food. Your dietitian will help you determine how many carbohydrates to eat at each meal and teach you how to count carbohydrates. Counting carbohydrates is important to keep your blood glucose at a healthy level, especially if you are using insulin or taking certain medicines for diabetes mellitus. Alcohol Alcohol can cause sudden decreases in blood glucose (hypoglycemia), especially if you use insulin or take certain medicines for diabetes mellitus. Hypoglycemia can be a life-threatening condition. Symptoms of hypoglycemia (sleepiness, dizziness, and disorientation) are similar to symptoms of  having too much alcohol.  If your health care provider has given you approval to drink alcohol, do so in moderation and use the following guidelines:  Women should not have more than one drink per day, and men should not have more than two drinks per day. One drink is equal to:  12 oz of beer.  5 oz of wine.  1 oz of hard liquor.  Do not drink on an empty stomach.  Keep yourself hydrated. Have water, diet soda, or unsweetened iced tea.  Regular soda, juice, and other mixers might contain a lot of carbohydrates and should be counted. WHAT FOODS ARE NOT RECOMMENDED? As you make food choices, it is important to remember that all foods are not the same. Some foods have fewer nutrients per serving than other foods, even though they might have the same number of calories or carbohydrates. It is difficult to get your body what it needs when you eat foods with fewer nutrients. Examples of foods that you should avoid that are high in calories and carbohydrates but low in nutrients include:  Trans fats (most processed foods list trans fats on the Nutrition Facts label).  Regular soda.  Juice.  Candy.  Sweets, such as cake, pie, doughnuts, and cookies.  Fried foods. WHAT FOODS CAN I EAT? Eat nutrient-rich foods, which will nourish your body and keep you healthy. The food you should eat also will depend on several factors, including:  The calories you need.  The medicines you take.  Your weight.  Your blood glucose level.  Your blood pressure level.  Your cholesterol level. You should eat a variety of foods, including:  Protein.  Lean cuts of meat.  Proteins low in saturated fats, such as fish, egg whites, and beans. Avoid processed meats.  Fruits and vegetables.  Fruits and vegetables that may help control blood glucose levels, such as apples, mangoes, and yams.  Dairy products.  Choose fat-free or low-fat dairy products, such as milk, yogurt, and cheese.  Grains,  bread, pasta, and rice.  Choose whole grain products, such as multigrain bread, whole oats, and brown rice. These foods may help control blood pressure.  Fats.  Foods containing healthful fats, such as nuts, avocado, olive oil, canola oil, and fish. DOES EVERYONE WITH DIABETES MELLITUS HAVE THE SAME MEAL PLAN? Because every person with diabetes mellitus is different, there is not one meal plan that works for everyone. It is very important that you meet with a dietitian who will help you create a meal plan that is just right for you.   This information is not intended to replace advice given to you by your health care provider. Make sure you discuss any questions you have with your health care provider.   Document Released: 01/19/2005 Document Revised: 05/15/2014 Document Reviewed: 03/21/2013 Elsevier Interactive Patient Education [employer] Inc.

## 2015-11-24 NOTE — ED Notes (Signed)
Patient here with complaint of blood sugar running high for the past day. Yesterday she checked her BS and it was 400. Reports that she hasnt taken metformin for the past 1 year. Complains of dizziness for the past day. Alert and oriented,

## 2015-12-09 ENCOUNTER — Ambulatory Visit: Payer: Self-pay | Attending: Internal Medicine | Admitting: Internal Medicine

## 2015-12-09 ENCOUNTER — Encounter: Payer: Self-pay | Admitting: Internal Medicine

## 2015-12-09 DIAGNOSIS — J45909 Unspecified asthma, uncomplicated: Secondary | ICD-10-CM | POA: Insufficient documentation

## 2015-12-09 DIAGNOSIS — Z7984 Long term (current) use of oral hypoglycemic drugs: Secondary | ICD-10-CM | POA: Insufficient documentation

## 2015-12-09 DIAGNOSIS — E119 Type 2 diabetes mellitus without complications: Secondary | ICD-10-CM | POA: Insufficient documentation

## 2015-12-09 DIAGNOSIS — E669 Obesity, unspecified: Secondary | ICD-10-CM | POA: Insufficient documentation

## 2015-12-09 LAB — POCT GLYCOSYLATED HEMOGLOBIN (HGB A1C): HEMOGLOBIN A1C: 10.4

## 2015-12-09 LAB — GLUCOSE, POCT (MANUAL RESULT ENTRY): POC Glucose: 180 mg/dl — AB (ref 70–99)

## 2015-12-09 MED ORDER — TRUE METRIX METER W/DEVICE KIT: 1.0000 | PACK | Freq: Three times a day (TID) | 0 refills | Status: AC

## 2015-12-09 MED ORDER — TRUEPLUS LANCETS 28G MISC: 1.0000 | Freq: Three times a day (TID) | 12 refills | Status: AC

## 2015-12-09 MED ORDER — GLUCOSE BLOOD VI STRP: ORAL_STRIP | 12 refills | Status: AC

## 2015-12-09 MED ORDER — METFORMIN HCL 1000 MG PO TABS: 1000.0000 mg | ORAL_TABLET | Freq: Two times a day (BID) | ORAL | 3 refills | Status: DC

## 2015-12-09 MED FILL — TRUE METRIX TEST STRIP: 30 days supply | Qty: 100 | Fill #0

## 2015-12-09 MED FILL — TRUE METRIX BLOOD GLUCOSE M: W/DEVICE | 30 days supply | Qty: 1 | Fill #0

## 2015-12-09 MED FILL — TRUEplus LANCETS 28G MISC: 30 days supply | Qty: 100 | Fill #0

## 2015-12-09 NOTE — Progress Notes (Signed)
Patient is here or New onset DM  Patient denies pain at this time.  Patient has not taken medication today and patient has not eaten.

## 2015-12-09 NOTE — Progress Notes (Signed)
[name], is a 30 y.o. female  PTW:[id number]  ZGY:[id number]  DOB - Dec 21, 1985  Chief Complaint  Patient presents with  . Diabetes        Subjective:   [name] is a 30 y.o. female with history of type 2 diabetes mellitus and obesity here today for a follow up ED visit. Patient was last seen here in June 2015, she however presented to the ED last month with complaints of hyperglycemia. She has been on metformin 500 mg tablet by mouth twice a day but stopped taking them on how her overall one year prior to ED visit. Her blood sugar at home before she went to the ED was over 400 and she had no medications, she also had associated polyuria and polydipsia with occasional lightheadedness and dizziness. She was restarted on her metformin at 500 mg twice a day was given this appointment for follow-up today. Patient has no new complaint except the blood sugar is still high. Blood sugar today is 180. Hemoglobin A1c has however gone up to 10.4%. Patient has No headache, No chest pain, No abdominal pain - No Nausea, No new weakness tingling or numbness, No Cough - SOB.  Problem  New Onset Type 2 Diabetes Mellitus (Hcc) (Resolved)    ALLERGIES: Allergies  Allergen Reactions  . Other     An antibiotic but she can't recall the name    PAST MEDICAL HISTORY: Past Medical History:  Diagnosis Date  . Asthma   . Diabetes mellitus without complication ([place])   . Obesity     MEDICATIONS AT HOME: Prior to Admission medications   Medication Sig Start Date End Date Taking? Authorizing Provider  Blood Glucose Monitoring Suppl (TRUE METRIX METER) w/Device KIT 1 each by Does not apply route 4 (four) times daily -  before meals and at bedtime. 12/09/15   Tresa Garter, MD  glucose blood (TRUE METRIX BLOOD GLUCOSE TEST) test strip Use as instructed 12/09/15   Tresa Garter, MD  metFORMIN (GLUCOPHAGE) 1000 MG tablet Take 1 tablet (1,000 mg total) by mouth 2 (two) times daily with a  meal. 12/09/15   Tresa Garter, MD  TRUEPLUS LANCETS 28G MISC 1 each by Does not apply route 4 (four) times daily - after meals and at bedtime. 12/09/15   Tresa Garter, MD     Objective:   Vitals:   12/09/15 1228  BP: 106/71  Pulse: 65  Resp: 18  Temp: 98.1 F (36.7 C)  TempSrc: Oral  SpO2: 100%  Weight: 187 lb 3.2 oz (84.9 kg)  Height: '5\' 2"'  (1.575 m)    Exam General appearance : Awake, alert, not in any distress. Speech Clear. Not toxic looking, obese HEENT: Atraumatic and Normocephalic, pupils equally reactive to light and accomodation Neck: Supple, no JVD. No cervical lymphadenopathy.  Chest: Good air entry bilaterally, no added sounds  CVS: S1 S2 regular, no murmurs.  Abdomen: Bowel sounds present, Non tender and not distended with no gaurding, rigidity or rebound. Extremities: B/L Lower Ext shows no edema, both legs are warm to touch Neurology: Awake alert, and oriented X 3, CN II-XII intact, Non focal Skin: No Rash  Data Review Lab Results  Component Value Date   HGBA1C 10.4 12/09/2015   HGBA1C 6.0 10/16/2013   HGBA1C  11/10/2008    5.6 (NOTE) The ADA recommends the following therapeutic goal for glycemic control related to Hgb A1c measurement: Goal of therapy: <6.5 Hgb A1c  Reference: American Diabetes Association:  Clinical Practice Recommendations 2010, Diabetes Care, 2010, 33: (Suppl  1).     Assessment & Plan   1. Type 2 diabetes mellitus without complication, without long-term current use of insulin (HCC)  - HgB A1c is 10.4% today. Will increase metformin to 1000 mg PO BID until HbA1C showed downward trend. Dietary control emphasized. - Glucose (CBG) - Microalbumin/Creatinine Ratio, Urine - metFORMIN (GLUCOPHAGE) 1000 MG tablet; Take 1 tablet (1,000 mg total) by mouth 2 (two) times daily with a meal.  Dispense: 180 tablet; Refill: 3  Aim for 30 minutes of exercise most days. Rethink what you drink. Water is great! Aim for 2-3 Carb Choices  per meal (30-45 grams) +/- 1 either way  Aim for 0-15 Carbs per snack if hungry  Include protein in moderation with your meals and snacks  Consider reading food labels for Total Carbohydrate and Fat Grams of foods  Consider checking BG at alternate times per day  Continue taking medication as directed Be mindful about how much sugar you are adding to beverages and other foods. Fruit Punch - find one with no sugar  Measure and decrease portions of carbohydrate foods  Make your plate and don't go back for seconds  Patient have been counseled extensively about nutrition and exercise  Return in about 3 months (around 03/10/2016) for Hemoglobin A1C and Follow up, DM.  The patient was given clear instructions to go to ER or return to medical center if symptoms don't improve, worsen or new problems develop. The patient verbalized understanding. The patient was told to call to get lab results if they haven't heard anything in the next week.   This note has been created with Surveyor, quantity. Any transcriptional errors are unintentional.    Angelica Chessman, MD, [place], [place], [place], [place] and [place], [place]   12/09/2015, 12:38 PM

## 2015-12-09 NOTE — Patient Instructions (Signed)
Diabetes and Exercise Exercising regularly is important. It is not just about losing weight. It has many health benefits, such as:  Improving your overall fitness, flexibility, and endurance.  Increasing your bone density.  Helping with weight control.  Decreasing your body fat.  Increasing your muscle strength.  Reducing stress and tension.  Improving your overall health. People with diabetes who exercise gain additional benefits because exercise:  Reduces appetite.  Improves the body's use of blood sugar (glucose).  Helps lower or control blood glucose.  Decreases blood pressure.  Helps control blood lipids (such as cholesterol and triglycerides).  Improves the body's use of the hormone insulin by:  Increasing the body's insulin sensitivity.  Reducing the body's insulin needs.  Decreases the risk for heart disease because exercising:  Lowers cholesterol and triglycerides levels.  Increases the levels of good cholesterol (such as high-density lipoproteins [HDL]) in the body.  Lowers blood glucose levels. YOUR ACTIVITY PLAN  Choose an activity that you enjoy, and set realistic goals. To exercise safely, you should begin practicing any new physical activity slowly, and gradually increase the intensity of the exercise over time. Your health care provider or diabetes educator can help create an activity plan that works for you. General recommendations include:  Encouraging children to engage in at least 60 minutes of physical activity each day.  Stretching and performing strength training exercises, such as yoga or weight lifting, at least 2 times per week.  Performing a total of at least 150 minutes of moderate-intensity exercise each week, such as brisk walking or water aerobics.  Exercising at least 3 days per week, making sure you allow no more than 2 consecutive days to pass without exercising.  Avoiding long periods of inactivity (90 minutes or more). When you  have to spend an extended period of time sitting down, take frequent breaks to walk or stretch. RECOMMENDATIONS FOR EXERCISING WITH TYPE 1 OR TYPE 2 DIABETES   Check your blood glucose before exercising. If blood glucose levels are greater than 240 mg/dL, check for urine ketones. Do not exercise if ketones are present.  Avoid injecting insulin into areas of the body that are going to be exercised. For example, avoid injecting insulin into:  The arms when playing tennis.  The legs when jogging.  Keep a record of:  Food intake before and after you exercise.  Expected peak times of insulin action.  Blood glucose levels before and after you exercise.  The type and amount of exercise you have done.  Review your records with your health care provider. Your health care provider will help you to develop guidelines for adjusting food intake and insulin amounts before and after exercising.  If you take insulin or oral hypoglycemic agents, watch for signs and symptoms of hypoglycemia. They include:  Dizziness.  Shaking.  Sweating.  Chills.  Confusion.  Drink plenty of water while you exercise to prevent dehydration or heat stroke. Body water is lost during exercise and must be replaced.  Talk to your health care provider before starting an exercise program to make sure it is safe for you. Remember, almost any type of activity is better than none.   This information is not intended to replace advice given to you by your health care provider. Make sure you discuss any questions you have with your health care provider.   Document Released: 07/15/2003 Document Revised: 09/08/2014 Document Reviewed: 10/01/2012 Elsevier Interactive Patient Education 2016 Elsevier Inc. Basic Carbohydrate Counting for Diabetes Mellitus Carbohydrate counting   is a method for keeping track of the amount of carbohydrates you eat. Eating carbohydrates naturally increases the level of sugar (glucose) in your  blood, so it is important for you to know the amount that is okay for you to have in every meal. Carbohydrate counting helps keep the level of glucose in your blood within normal limits. The amount of carbohydrates allowed is different for every person. A dietitian can help you calculate the amount that is right for you. Once you know the amount of carbohydrates you can have, you can count the carbohydrates in the foods you want to eat. Carbohydrates are found in the following foods:  Grains, such as breads and cereals.  Dried beans and soy products.  Starchy vegetables, such as potatoes, peas, and corn.  Fruit and fruit juices.  Milk and yogurt.  Sweets and snack foods, such as cake, cookies, candy, chips, soft drinks, and fruit drinks. CARBOHYDRATE COUNTING There are two ways to count the carbohydrates in your food. You can use either of the methods or a combination of both. Reading the "Nutrition Facts" on Packaged Food The "Nutrition Facts" is an area that is included on the labels of almost all packaged food and beverages in the [place]. It includes the serving size of that food or beverage and information about the nutrients in each serving of the food, including the grams (g) of carbohydrate per serving.  Decide the number of servings of this food or beverage that you will be able to eat or drink. Multiply that number of servings by the number of grams of carbohydrate that is listed on the label for that serving. The total will be the amount of carbohydrates you will be having when you eat or drink this food or beverage. Learning Standard Serving Sizes of Food When you eat food that is not packaged or does not include "Nutrition Facts" on the label, you need to measure the servings in order to count the amount of carbohydrates.A serving of most carbohydrate-rich foods contains about 15 g of carbohydrates. The following list includes serving sizes of carbohydrate-rich foods that  provide 15 g ofcarbohydrate per serving:   1 slice of bread (1 oz) or 1 six-inch tortilla.    of a hamburger bun or English muffin.  4-6 crackers.   cup unsweetened dry cereal.    cup hot cereal.   cup rice or pasta.    cup mashed potatoes or  of a large baked potato.  1 cup fresh fruit or one small piece of fruit.    cup canned or frozen fruit or fruit juice.  1 cup milk.   cup plain fat-free yogurt or yogurt sweetened with artificial sweeteners.   cup cooked dried beans or starchy vegetable, such as peas, corn, or potatoes.  Decide the number of standard-size servings that you will eat. Multiply that number of servings by 15 (the grams of carbohydrates in that serving). For example, if you eat 2 cups of strawberries, you will have eaten 2 servings and 30 g of carbohydrates (2 servings x 15 g = 30 g). For foods such as soups and casseroles, in which more than one food is mixed in, you will need to count the carbohydrates in each food that is included. EXAMPLE OF CARBOHYDRATE COUNTING Sample Dinner  3 oz chicken breast.   cup of brown rice.   cup of corn.  1 cup milk.   1 cup strawberries with sugar-free whipped topping.  Carbohydrate Calculation Step   1: Identify the foods that contain carbohydrates:   Rice.   Corn.   Milk.   Strawberries. Step 2:Calculate the number of servings eaten of each:   2 servings of rice.   1 serving of corn.   1 serving of milk.   1 serving of strawberries. Step 3: Multiply each of those number of servings by 15 g:   2 servings of rice x 15 g = 30 g.   1 serving of corn x 15 g = 15 g.   1 serving of milk x 15 g = 15 g.   1 serving of strawberries x 15 g = 15 g. Step 4: Add together all of the amounts to find the total grams of carbohydrates eaten: 30 g + 15 g + 15 g + 15 g = 75 g.   This information is not intended to replace advice given to you by your health care provider. Make sure you  discuss any questions you have with your health care provider.   Document Released: 04/24/2005 Document Revised: 05/15/2014 Document Reviewed: 03/21/2013 Elsevier Interactive Patient Education 2016 Elsevier Inc.  

## 2015-12-10 LAB — MICROALBUMIN / CREATININE URINE RATIO
Creatinine, Urine: 122 mg/dL (ref 20–320)
Microalb Creat Ratio: 2 mcg/mg creat (ref <30)
Microalb, Ur: 0.2 mg/dL

## 2015-12-13 ENCOUNTER — Telehealth: Payer: Self-pay | Admitting: Internal Medicine

## 2015-12-13 DIAGNOSIS — E119 Type 2 diabetes mellitus without complications: Secondary | ICD-10-CM

## 2015-12-13 MED ORDER — METFORMIN HCL 500 MG PO TABS: 1000.0000 mg | ORAL_TABLET | Freq: Every day | ORAL | 2 refills | Status: DC

## 2015-12-13 NOTE — Telephone Encounter (Signed)
Called pt, confirmed dob.  She has been eating more healthy, salads, less carbs, and going to gym more.  Noted that metforming 1000mg  dropping her cbg to 60s lately. No syncope/presyncope.  Recd take only metformin 500mg  daily for now. F/u appt w/ [facility] next wk.

## 2015-12-13 NOTE — Telephone Encounter (Signed)
Pt calling stating what when she takes her Metformin, her blood sugar drops very low.  Pt states she tried taking only one pill, versus two, and her sugar still dropped low  Pt would like to speak to nurse to know what she should do  Please assist, thank you

## 2015-12-13 NOTE — Telephone Encounter (Signed)
Will forward to Dr. Langeland 

## 2015-12-15 ENCOUNTER — Encounter: Payer: Self-pay | Admitting: Internal Medicine

## 2015-12-16 ENCOUNTER — Encounter: Payer: Self-pay | Admitting: Internal Medicine

## 2015-12-16 ENCOUNTER — Encounter (HOSPITAL_COMMUNITY): Payer: Self-pay | Admitting: Emergency Medicine

## 2015-12-16 ENCOUNTER — Emergency Department (HOSPITAL_COMMUNITY)
Admission: EM | Admit: 2015-12-16 | Discharge: 2015-12-16 | Disposition: A | Discharge status: Home / Self Care | Payer: Self-pay | Attending: Emergency Medicine | Admitting: Emergency Medicine

## 2015-12-16 ENCOUNTER — Telehealth: Payer: Self-pay | Admitting: Internal Medicine

## 2015-12-16 DIAGNOSIS — Z7984 Long term (current) use of oral hypoglycemic drugs: Secondary | ICD-10-CM | POA: Insufficient documentation

## 2015-12-16 DIAGNOSIS — M545 Low back pain, unspecified: Secondary | ICD-10-CM

## 2015-12-16 DIAGNOSIS — J45909 Unspecified asthma, uncomplicated: Secondary | ICD-10-CM | POA: Insufficient documentation

## 2015-12-16 DIAGNOSIS — E119 Type 2 diabetes mellitus without complications: Secondary | ICD-10-CM | POA: Insufficient documentation

## 2015-12-16 MED ORDER — IBUPROFEN 600 MG PO TABS: 600.0000 mg | ORAL_TABLET | Freq: Four times a day (QID) | ORAL | 0 refills | Status: DC | PRN

## 2015-12-16 MED ORDER — METHOCARBAMOL 500 MG PO TABS: 500.0000 mg | ORAL_TABLET | Freq: Two times a day (BID) | ORAL | 0 refills | Status: DC

## 2015-12-16 NOTE — Discharge Instructions (Signed)
Take your medications as prescribed as needed for pain. I also recommend resting and applying ice to affected area for 15-20 minutes 3-4 times daily. I also recommend refraining from doing any heavy lifting, bending or repetitive movements that exacerbate your symptoms for the next few days.  Follow up with your primary care provider in 1 week if your sxs have not improved. Please return to the Emergency Department if symptoms worsen or new onset of fever, numbness, tingling, saddle anesthesia, loss of bowel or bladder, abdominal pain, urinary symptoms, weakness.

## 2015-12-16 NOTE — ED Provider Notes (Signed)
[facility] DEPT Provider Note   CSN: [id number] Arrival date & time: 12/16/15  1609  First Provider Contact:   First MD Initiated Contact with Patient 12/16/15 1721      By signing my name below, I, [name], attest that this documentation has been prepared under the direction and in the presence of Gloria Ramus, PA-C.  Electronically Signed: Julien Garrett, ED Scribe. 12/16/15. 5:34 PM.    History   Chief Complaint Chief Complaint  Patient presents with  . Back Pain    back and side pain    The history is provided by the patient. No language interpreter was used.   HPI Comments: [name] is a 30 y.o. female who has a PMhx of DM presents to the Emergency Department complaining of sudden onset, gradual worsening, moderate, "tight" bilateral lower back pain onset two days ago. 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 Denies radiation of pain. Pain worse with bending or lifting objects. Pt has not taken any medication to alleviate her pain.  She denies fever, abdominal pain, vomiting, diarrhea, vaginal bleeding, vaginal discharge, neck pain, numbness, tingling, weakness, hematuria, bowel or bladder incontinence, dysuria, or hx of IV drug use. LMP 1 week ago.    Past Medical History:  Diagnosis Date  . Asthma   . Diabetes mellitus without complication ([place])   . Obesity     There are no active problems to display for this patient.   Past Surgical History:  Procedure Laterality Date  . CESAREAN SECTION    . TUBAL LIGATION      OB History    No data available       Home Medications    Prior to Admission medications   Medication Sig Start Date End Date Taking? Authorizing Provider  Blood Glucose Monitoring Suppl (TRUE METRIX METER) w/Device KIT 1 each by Does not apply route 4 (four) times daily -  before meals and at bedtime. 12/09/15   Tresa Garter, MD  glucose blood (TRUE METRIX  BLOOD GLUCOSE TEST) test strip Use as instructed 12/09/15   Tresa Garter, MD  ibuprofen (ADVIL,MOTRIN) 600 MG tablet Take 1 tablet (600 mg total) by mouth every 6 (six) hours as needed. 12/16/15   Nona Dell, PA-C  metFORMIN (GLUCOPHAGE) 500 MG tablet Take 2 tablets (1,000 mg total) by mouth daily with breakfast. 12/13/15   Maren Reamer, MD  methocarbamol (ROBAXIN) 500 MG tablet Take 1 tablet (500 mg total) by mouth 2 (two) times daily. 12/16/15   Nona Dell, PA-C  TRUEPLUS LANCETS 28G MISC 1 each by Does not apply route 4 (four) times daily - after meals and at bedtime. 12/09/15   Tresa Garter, MD    Family History Family History  Problem Relation Age of Onset  . Diabetes Maternal Aunt     Social History Social History  Substance Use Topics  . Smoking status: Never Smoker  . Smokeless tobacco: Never Used  . Alcohol use No     Allergies   Other   Review of Systems Review of Systems  Constitutional: Negative for fever.  Cardiovascular: Negative for chest pain.  Gastrointestinal: Negative for abdominal pain, diarrhea and vomiting.  Genitourinary: Negative for difficulty urinating, dysuria, hematuria, vaginal bleeding and vaginal discharge.  Musculoskeletal: Positive for back pain. Negative for myalgias.  Neurological: Negative for weakness and numbness.  All other systems reviewed and are negative.  Physical Exam Updated Vital Signs BP 122/70 (BP Location: Left Arm)   Pulse 66   Temp 98.1 F (36.7 C) (Oral)   Resp 20   Wt 84.8 kg   LMP 12/09/2015 (Exact Date)   SpO2 100%   BMI 34.20 kg/m   Physical Exam  Constitutional: She is oriented to person, place, and time. She appears well-developed and well-nourished.  Non-toxic appearance. She does not have a sickly appearance. She does not appear ill.  HENT:  Head: Normocephalic and atraumatic.  Mouth/Throat: No oropharyngeal exudate.  Eyes: Conjunctivae and EOM are normal. Right  eye exhibits no discharge. Left eye exhibits no discharge. No scleral icterus.  Neck: Normal range of motion. Neck supple.  Cardiovascular: Normal rate, regular rhythm, normal heart sounds and intact distal pulses.   Pulmonary/Chest: Effort normal and breath sounds normal. No accessory muscle usage or stridor. No respiratory distress. She has no wheezes. She has no rhonchi. She has no rales. She exhibits no tenderness.  Abdominal: Soft. Bowel sounds are normal. She exhibits no distension and no mass. There is no tenderness. There is no rebound and no guarding. No hernia.  No CVA tenderness  Musculoskeletal: Normal range of motion. She exhibits no edema.  No midline C, T, or L tenderness. Mild TTP over bilateral lumbar paraspinal muscles. Full range of motion of neck and back. Full range of motion of bilateral upper and lower extremities, with 5/5 strength. Sensation intact. 2+ radial and PT pulses. Cap refill <2 seconds. Patient able to stand and ambulate without assistance.    Lymphadenopathy:    She has no cervical adenopathy.  Neurological: She is alert and oriented to person, place, and time. She has normal strength and normal reflexes. No sensory deficit. Gait normal.  Speech clear without dysarthria.  Skin: Skin is warm and dry.  Psychiatric: She has a normal mood and affect. Her behavior is normal.  Nursing note and vitals reviewed.    ED Treatments / Results  DIAGNOSTIC STUDIES: Oxygen Saturation is 100% on RA, normal by my interpretation.  COORDINATION OF CARE:  5:28 PM Discussed treatment plan with pt at bedside and pt agreed to plan.  Labs (all labs ordered are listed, but only abnormal results are displayed) Labs Reviewed - No data to display  EKG  EKG Interpretation None       Radiology No results found.  Procedures Procedures (including critical care time)  Medications Ordered in ED Medications - No data to display   Initial Impression / Assessment and Plan  / ED Course  I have reviewed the triage vital signs and the nursing notes.  Pertinent labs & imaging results that were available during my care of the patient were reviewed by me and considered in my medical decision making (see chart for details).  Clinical Course    Patient with low back pain without radiation that occurred after lifting and carrying heavy boxes at work this week. Pain worse with movement. VSS. Exam Revealed bilateral lumbar paraspinal muscles tender to palpation, no CVA tenderness, abdominal exam benign. No neuro deficits. Patient able to stand and ambulate without assistance. No loss of bowel or bladder control.  No concern for cauda equina.  No fever, night sweats, weight loss, h/o cancer, IVDU. I suspect patient's symptoms are likely due to muscle strain associated with recent increased activity at work this week  RIdo not feel any further workup or imaging is warranted at this time. Plan to discharge patient home with muscle relaxant, RICE  protocol and pain medicine.  Advised patient to follow up with PCP as needed. Discussed return precautions to patient.  Final Clinical Impressions(s) / ED Diagnoses   Final diagnoses:  Bilateral low back pain without sciatica    New Prescriptions New Prescriptions   IBUPROFEN (ADVIL,MOTRIN) 600 MG TABLET    Take 1 tablet (600 mg total) by mouth every 6 (six) hours as needed.   METHOCARBAMOL (ROBAXIN) 500 MG TABLET    Take 1 tablet (500 mg total) by mouth 2 (two) times daily.   I personally performed the services described in this documentation, which was scribed in my presence. The recorded information has been reviewed and is accurate.    Chesley Noon [place], [place] 12/16/15 [place], MD 12/16/15 [phone]

## 2015-12-16 NOTE — Telephone Encounter (Signed)
Pt calling about her urine test results Pt is able to see results on her MyChart but does not understand what they mean  Pt was advised that PCP was still reviewing the results and she would be contacted after that process was completed  Pt is concerned that she has an infection and states she does not want to wait until next week to hear from PCP or nurse if she needs to take medicine

## 2015-12-17 NOTE — Telephone Encounter (Signed)
Return call to patient, PIV per hipaa guidelines.  RN advised Dr. Hyman HopesJegede will be back in office on Monday and she should receive call early next week re: lab test.  Patient verbalized understanding.

## 2016-05-01 ENCOUNTER — Emergency Department (HOSPITAL_COMMUNITY)
Admission: EM | Admit: 2016-05-01 | Discharge: 2016-05-01 | Disposition: A | Discharge status: Home / Self Care | Payer: Self-pay | Attending: Emergency Medicine | Admitting: Emergency Medicine

## 2016-05-01 ENCOUNTER — Encounter (HOSPITAL_COMMUNITY): Payer: Self-pay

## 2016-05-01 ENCOUNTER — Emergency Department (HOSPITAL_COMMUNITY): Payer: Self-pay

## 2016-05-01 DIAGNOSIS — E1165 Type 2 diabetes mellitus with hyperglycemia: Secondary | ICD-10-CM | POA: Insufficient documentation

## 2016-05-01 DIAGNOSIS — J069 Acute upper respiratory infection, unspecified: Secondary | ICD-10-CM | POA: Insufficient documentation

## 2016-05-01 DIAGNOSIS — R739 Hyperglycemia, unspecified: Secondary | ICD-10-CM

## 2016-05-01 DIAGNOSIS — J45909 Unspecified asthma, uncomplicated: Secondary | ICD-10-CM | POA: Insufficient documentation

## 2016-05-01 DIAGNOSIS — Z7984 Long term (current) use of oral hypoglycemic drugs: Secondary | ICD-10-CM | POA: Insufficient documentation

## 2016-05-01 DIAGNOSIS — Z79899 Other long term (current) drug therapy: Secondary | ICD-10-CM | POA: Insufficient documentation

## 2016-05-01 LAB — CBC
HEMATOCRIT: 37.8 % (ref 36.0–46.0)
HEMOGLOBIN: 12.7 g/dL (ref 12.0–15.0)
MCH: 25 pg — ABNORMAL LOW (ref 26.0–34.0)
MCHC: 33.6 g/dL (ref 30.0–36.0)
MCV: 74.6 fL — ABNORMAL LOW (ref 78.0–100.0)
Platelets: 209 10*3/uL (ref 150–400)
RBC: 5.07 MIL/uL (ref 3.87–5.11)
RDW: 13 % (ref 11.5–15.5)
WBC: 7.4 10*3/uL (ref 4.0–10.5)

## 2016-05-01 LAB — URINALYSIS, ROUTINE W REFLEX MICROSCOPIC
BILIRUBIN URINE: NEGATIVE
Glucose, UA: 150 mg/dL — AB
Hgb urine dipstick: NEGATIVE
KETONES UR: 20 mg/dL — AB
LEUKOCYTES UA: NEGATIVE
NITRITE: NEGATIVE
PH: 5 (ref 5.0–8.0)
Protein, ur: NEGATIVE mg/dL
SPECIFIC GRAVITY, URINE: 1.013 (ref 1.005–1.030)

## 2016-05-01 LAB — BASIC METABOLIC PANEL
ANION GAP: 10 (ref 5–15)
BUN: 14 mg/dL (ref 6–20)
CALCIUM: 9.5 mg/dL (ref 8.9–10.3)
CO2: 20 mmol/L — ABNORMAL LOW (ref 22–32)
Chloride: 104 mmol/L (ref 101–111)
Creatinine, Ser: 0.9 mg/dL (ref 0.44–1.00)
GLUCOSE: 235 mg/dL — AB (ref 65–99)
POTASSIUM: 3.8 mmol/L (ref 3.5–5.1)
SODIUM: 134 mmol/L — AB (ref 135–145)

## 2016-05-01 LAB — I-STAT TROPONIN, ED: TROPONIN I, POC: 0 ng/mL (ref 0.00–0.08)

## 2016-05-01 LAB — CBG MONITORING, ED: GLUCOSE-CAPILLARY: 237 mg/dL — AB (ref 65–99)

## 2016-05-01 MED ORDER — BENZONATATE 100 MG PO CAPS: 100.0000 mg | ORAL_CAPSULE | Freq: Three times a day (TID) | ORAL | 0 refills | Status: DC | PRN

## 2016-05-01 MED ORDER — SODIUM CHLORIDE 0.9 % IV BOLUS (SEPSIS)
1000.0000 mL | Freq: Once | INTRAVENOUS | Status: AC
  Administered 2016-05-01: 1000 mL via INTRAVENOUS

## 2016-05-01 NOTE — ED Provider Notes (Signed)
[facility] DEPT Provider Note   CSN: [id number] Arrival date & time: 05/01/16  9379  By signing my name below, I, Hansel Feinstein, attest that this documentation has been prepared under the direction and in the presence of Quintella Reichert, MD. Electronically Signed: Hansel Feinstein, ED Scribe. 05/01/16. 3:35 AM.    History   Chief Complaint Chief Complaint  Patient presents with  . Hyperglycemia  . Shortness of Breath  . Cough    HPI [name] is a 30 y.o. female with h/o DM on Metformin who presents to the Emergency Department for evaluation of elevated CBGs in the 400s x4 days. Pt states she is prescribed bid Metformin, but has only been taking half a pill bid for the last 2 weeks. Pt also complains of dry cough x4 days, constant CP and pressure x 2 weeks, BLE aching and a "pinching" sensation to bilateral hips. No worsening or alleviating factors noted for CP. Pt is ambulatory without difficulty. She denies fever, abdominal pain, vomiting, diarrhea, dysuria, leg swelling. No other daily medications. Pt reports h/o tubal ligation and states her periods are regular.   The history is provided by the patient. No language interpreter was used.    Past Medical History:  Diagnosis Date  . Asthma   . Diabetes mellitus without complication ([place])   . Obesity     There are no active problems to display for this patient.   Past Surgical History:  Procedure Laterality Date  . CESAREAN SECTION    . TUBAL LIGATION      OB History    No data available       Home Medications    Prior to Admission medications   Medication Sig Start Date End Date Taking? Authorizing Provider  Blood Glucose Monitoring Suppl (TRUE METRIX METER) w/Device KIT 1 each by Does not apply route 4 (four) times daily -  before meals and at bedtime. 12/09/15   Tresa Garter, MD  glucose blood (TRUE METRIX BLOOD GLUCOSE TEST) test strip Use as instructed 12/09/15   Tresa Garter, MD  ibuprofen  (ADVIL,MOTRIN) 600 MG tablet Take 1 tablet (600 mg total) by mouth every 6 (six) hours as needed. 12/16/15   Nona Dell, PA-C  metFORMIN (GLUCOPHAGE) 500 MG tablet Take 2 tablets (1,000 mg total) by mouth daily with breakfast. 12/13/15   Maren Reamer, MD  methocarbamol (ROBAXIN) 500 MG tablet Take 1 tablet (500 mg total) by mouth 2 (two) times daily. 12/16/15   Nona Dell, PA-C  TRUEPLUS LANCETS 28G MISC 1 each by Does not apply route 4 (four) times daily - after meals and at bedtime. 12/09/15   Tresa Garter, MD    Family History Family History  Problem Relation Age of Onset  . Diabetes Maternal Aunt     Social History Social History  Substance Use Topics  . Smoking status: Never Smoker  . Smokeless tobacco: Never Used  . Alcohol use No     Allergies   Other   Review of Systems Review of Systems  Constitutional: Negative for fever.  Respiratory: Positive for cough.   Cardiovascular: Positive for chest pain. Negative for leg swelling.  Gastrointestinal: Negative for abdominal pain, diarrhea and vomiting.  Genitourinary: Negative for dysuria.  Musculoskeletal: Positive for myalgias.  All other systems reviewed and are negative.   Physical Exam Updated Vital Signs BP 121/65   Pulse 64   Temp 98.6 F (37 C) (Oral)   Resp 13  LMP 04/02/2016 (Approximate)   SpO2 97%   Physical Exam  Constitutional: She is oriented to person, place, and time. She appears well-developed and well-nourished.  HENT:  Head: Normocephalic and atraumatic.  Cardiovascular: Normal rate and regular rhythm.   No murmur heard. Pulmonary/Chest: Effort normal and breath sounds normal. No respiratory distress.  Abdominal: Soft. There is no tenderness. There is no rebound and no guarding.  Musculoskeletal: She exhibits no edema or tenderness.  Neurological: She is alert and oriented to person, place, and time.  Skin: Skin is warm and dry.  Psychiatric: She has a  normal mood and affect. Her behavior is normal.  Nursing note and vitals reviewed.    ED Treatments / Results   DIAGNOSTIC STUDIES: Oxygen Saturation is 100% on RA, normal by my interpretation.    COORDINATION OF CARE: 3:33 AM Discussed treatment plan with pt at bedside which includes CBG and pt agreed to plan.    Labs (all labs ordered are listed, but only abnormal results are displayed) Labs Reviewed  BASIC METABOLIC PANEL - Abnormal; Notable for the following:       Result Value   Sodium 134 (*)    CO2 20 (*)    Glucose, Bld 235 (*)    All other components within normal limits  CBC - Abnormal; Notable for the following:    MCV 74.6 (*)    MCH 25.0 (*)    All other components within normal limits  URINALYSIS, ROUTINE W REFLEX MICROSCOPIC - Abnormal; Notable for the following:    Color, Urine STRAW (*)    Glucose, UA 150 (*)    Ketones, ur 20 (*)    All other components within normal limits  CBG MONITORING, ED - Abnormal; Notable for the following:    Glucose-Capillary 237 (*)    All other components within normal limits  I-STAT TROPOININ, ED    EKG  EKG Interpretation  Date/Time:  Monday May 01 2016 02:38:49 EST Ventricular Rate:  68 PR Interval:    QRS Duration: 71 QT Interval:  394 QTC Calculation: 419 R Axis:   67 Text Interpretation:  Sinus rhythm Confirmed by Hazle Coca [phone]) on 05/01/2016 2:45:03 AM       Radiology No results found.  Procedures Procedures (including critical care time)  Medications Ordered in ED Medications  sodium chloride 0.9 % bolus 1,000 mL (not administered)     Initial Impression / Assessment and Plan / ED Course  I have reviewed the triage vital signs and the nursing notes.  Pertinent labs & imaging results that were available during my care of the patient were reviewed by me and considered in my medical decision making (see chart for details).  Clinical Course     Patient here for multiple complaints,  primarily hyperglycemia and cough. In terms of her blood sugar this is elevated but there is no evidence of DKA or kidney injury. She was provided IV fluid hydration. In terms of cough, clinical picture is not consistent with ACS, PE, pneumonia, CHF. Question possible URI, will treat with antitussives. Discussed increasing her metformin for better glucose control. Discussed home care, outpatient follow-up and return precautions.  Final Clinical Impressions(s) / ED Diagnoses   Final diagnoses:  Hyperglycemia  Upper respiratory tract infection, unspecified type    New Prescriptions New Prescriptions   No medications on file    I personally performed the services described in this documentation, which was scribed in my presence. The recorded information has been reviewed  and is accurate.    Quintella Reichert, MD 05/01/16 [phone]

## 2016-05-01 NOTE — ED Triage Notes (Signed)
Pt complaining of elevated blood sugars. Pt states CBG reading 400+. Pt states on Metformin, has been taking meds as rx. Pt also complaining of SOB and cough. Pt denies any chest pain or dizziness. Pt complaining of bilateral flank pain.

## 2016-05-01 NOTE — ED Notes (Signed)
Patient transported to X-ray 

## 2016-06-21 ENCOUNTER — Emergency Department (HOSPITAL_COMMUNITY)
Admission: EM | Admit: 2016-06-21 | Discharge: 2016-06-21 | Disposition: A | Discharge status: Home / Self Care | Payer: Self-pay | Attending: Emergency Medicine | Admitting: Emergency Medicine

## 2016-06-21 ENCOUNTER — Encounter (HOSPITAL_COMMUNITY): Payer: Self-pay

## 2016-06-21 ENCOUNTER — Emergency Department (HOSPITAL_COMMUNITY): Payer: Self-pay

## 2016-06-21 DIAGNOSIS — R739 Hyperglycemia, unspecified: Secondary | ICD-10-CM

## 2016-06-21 DIAGNOSIS — J45909 Unspecified asthma, uncomplicated: Secondary | ICD-10-CM | POA: Insufficient documentation

## 2016-06-21 DIAGNOSIS — R079 Chest pain, unspecified: Secondary | ICD-10-CM

## 2016-06-21 DIAGNOSIS — Z7984 Long term (current) use of oral hypoglycemic drugs: Secondary | ICD-10-CM | POA: Insufficient documentation

## 2016-06-21 DIAGNOSIS — R072 Precordial pain: Secondary | ICD-10-CM | POA: Insufficient documentation

## 2016-06-21 DIAGNOSIS — E1165 Type 2 diabetes mellitus with hyperglycemia: Secondary | ICD-10-CM | POA: Insufficient documentation

## 2016-06-21 LAB — BASIC METABOLIC PANEL
ANION GAP: 10 (ref 5–15)
BUN: 11 mg/dL (ref 6–20)
CALCIUM: 9.6 mg/dL (ref 8.9–10.3)
CO2: 23 mmol/L (ref 22–32)
Chloride: 105 mmol/L (ref 101–111)
Creatinine, Ser: 0.8 mg/dL (ref 0.44–1.00)
GFR calc Af Amer: >60 mL/min (ref >60)
GFR calc non Af Amer: >60 mL/min (ref >60)
GLUCOSE: 238 mg/dL — AB (ref 65–99)
POTASSIUM: 3.7 mmol/L (ref 3.5–5.1)
Sodium: 138 mmol/L (ref 135–145)

## 2016-06-21 LAB — CBC
HCT: 39.6 % (ref 36.0–46.0)
Hemoglobin: 13.1 g/dL (ref 12.0–15.0)
MCH: 25.1 pg — ABNORMAL LOW (ref 26.0–34.0)
MCHC: 33.1 g/dL (ref 30.0–36.0)
MCV: 76 fL — ABNORMAL LOW (ref 78.0–100.0)
Platelets: 196 10*3/uL (ref 150–400)
RBC: 5.21 MIL/uL — AB (ref 3.87–5.11)
RDW: 13.7 % (ref 11.5–15.5)
WBC: 6.7 10*3/uL (ref 4.0–10.5)

## 2016-06-21 LAB — I-STAT TROPONIN, ED
TROPONIN I, POC: 0 ng/mL (ref 0.00–0.08)
TROPONIN I, POC: 0.01 ng/mL (ref 0.00–0.08)

## 2016-06-21 MED ORDER — NAPROXEN 250 MG PO TABS
375.0000 mg | ORAL_TABLET | Freq: Once | ORAL | Status: AC
  Administered 2016-06-21: 375 mg via ORAL
  Filled 2016-06-21: qty 2

## 2016-06-21 NOTE — ED Notes (Signed)
Pt oob to br with steady gait 

## 2016-06-21 NOTE — ED Triage Notes (Signed)
Pt brought in by [facility]. Pt c/o centralized chest pain and shortness of breath starting at 1pm today. Pt states the pain feels like it's squeezing and is reproducible by palpation. Denies dizziness, nausea and radiation extremities at this time. Pt in NAD. A&Ox4

## 2016-06-21 NOTE — ED Provider Notes (Signed)
[facility] DEPT Provider Note   CSN: [id number] Arrival date & time: 06/21/16  1730     History   Chief Complaint Chief Complaint  Patient presents with  . Chest Pain  . Shortness of Breath    HPI [name] is a 31 y.o. female.  HPI Pt brought in by [facility]. Pt c/o centralized chest pain and shortness of breath starting at 1pm today. Pt states the pain feels like it's squeezing and is reproducible by palpation. Denies dizziness, nausea and radiation extremities at this time.   Patient states the symptoms began while she was waiting to pick up her child from the school bus. She states she had sudden onset of substernal chest pain that lasted less than a minute. She did not feel palpitations or lightheadedness. The event subsided and then she had a second one. She states she still has some chest discomfort which is about a 6 out of 10. Did have some mild shortness of breath but no cough, hemoptysis.  The pain is not exertional, she did feel mild nausea but did not vomit. It does not radiate. It was not pleuritic. No recent surgeries or immobilization, history of pulmonary embolism or DVT, leg swelling or calf pain, birth control use. There is no family history of early heart disease. Patient is a diabetic and asthma has been well controlled for many years. She is not a smoker and denies cocaine use. No radiation to her back and no history of hypertension.   Past Medical History:  Diagnosis Date  . Asthma   . Diabetes mellitus without complication ([place])   . Obesity     There are no active problems to display for this patient.   Past Surgical History:  Procedure Laterality Date  . CESAREAN SECTION    . TUBAL LIGATION      OB History    No data available       Home Medications    Prior to Admission medications   Medication Sig Start Date End Date Taking? Authorizing Provider  benzonatate (TESSALON) 100 MG capsule Take 1 capsule (100 mg total) by mouth 3 (three)  times daily as needed for cough. 05/01/16   Quintella Reichert, MD  Blood Glucose Monitoring Suppl (TRUE METRIX METER) w/Device KIT 1 each by Does not apply route 4 (four) times daily -  before meals and at bedtime. 12/09/15   Tresa Garter, MD  glucose blood (TRUE METRIX BLOOD GLUCOSE TEST) test strip Use as instructed 12/09/15   Tresa Garter, MD  ibuprofen (ADVIL,MOTRIN) 600 MG tablet Take 1 tablet (600 mg total) by mouth every 6 (six) hours as needed. Patient taking differently: Take 600 mg by mouth every 6 (six) hours as needed for moderate pain.  12/16/15   Nona Dell, PA-C  metFORMIN (GLUCOPHAGE) 500 MG tablet Take 2 tablets (1,000 mg total) by mouth daily with breakfast. 12/13/15   Maren Reamer, MD  TRUEPLUS LANCETS 28G MISC 1 each by Does not apply route 4 (four) times daily - after meals and at bedtime. 12/09/15   Tresa Garter, MD    Family History Family History  Problem Relation Age of Onset  . Diabetes Maternal Aunt     Social History Social History  Substance Use Topics  . Smoking status: Never Smoker  . Smokeless tobacco: Never Used  . Alcohol use No     Allergies   Other   Review of Systems Review of Systems  Constitutional: Negative for fever.  Allergic/Immunologic: Negative for immunocompromised state.  All other systems reviewed and are negative.    Physical Exam Updated Vital Signs Ht '5\' 2"'  (1.575 m)   Wt 86.2 kg   SpO2 100%   BMI 34.75 kg/m   Physical Exam  Constitutional: She appears well-developed and well-nourished. No distress.  HENT:  Head: Normocephalic and atraumatic.  Eyes: Conjunctivae are normal. Right eye exhibits no discharge. Left eye exhibits no discharge.  Neck: Normal range of motion. Neck supple. No JVD present.  Cardiovascular: Normal rate, regular rhythm, normal heart sounds and intact distal pulses.  Exam reveals no gallop and no friction rub.   Pulmonary/Chest: Effort normal and breath sounds normal.  No respiratory distress. She has no wheezes. She has no rales. She exhibits no tenderness.  Abdominal: Soft. Bowel sounds are normal. She exhibits no distension and no mass. There is no tenderness. There is no rebound and no guarding.  Musculoskeletal: Normal range of motion. She exhibits no edema, tenderness or deformity.  Neurological: She is alert.  Skin: Skin is warm. No rash noted.  Psychiatric: She has a normal mood and affect.  Nursing note and vitals reviewed.    ED Treatments / Results  Labs (all labs ordered are listed, but only abnormal results are displayed) Labs Reviewed  BASIC METABOLIC PANEL - Abnormal; Notable for the following:       Result Value   Glucose, Bld 238 (*)    All other components within normal limits  CBC - Abnormal; Notable for the following:    RBC 5.21 (*)    MCV 76.0 (*)    MCH 25.1 (*)    All other components within normal limits  I-STAT TROPOININ, ED  POC URINE PREG, ED  Randolm Idol, ED  Randolm Idol, ED    EKG  EKG Interpretation  Date/Time:  Wednesday June 21 2016 17:34:51 EST Ventricular Rate:  67 PR Interval:    QRS Duration: 69 QT Interval:  381 QTC Calculation: 403 R Axis:   50 Text Interpretation:  Sinus arrhythmia No significant change since last tracing Confirmed by Ashok Cordia  MD, Lennette Bihari ([id number]) on 06/21/2016 7:51:44 PM       Radiology Dg Chest 2 View  Result Date: 06/21/2016 CLINICAL DATA:  Acute onset of central chest pain and shortness of breath. Initial encounter. EXAM: CHEST  2 VIEW COMPARISON:  Chest radiograph performed 05/01/2016 FINDINGS: The lungs are well-aerated and clear. There is no evidence of focal opacification, pleural effusion or pneumothorax. The heart is normal in size; the mediastinal contour is within normal limits. No acute osseous abnormalities are seen. IMPRESSION: No acute cardiopulmonary process seen. Electronically Signed   By: Garald Balding M.D.   On: 06/21/2016 18:06     Procedures Procedures (including critical care time)  Medications Ordered in ED Medications  naproxen (NAPROSYN) tablet 375 mg (375 mg Oral Given 06/21/16 1840)     Initial Impression / Assessment and Plan / ED Course  I have reviewed the triage vital signs and the nursing notes.  Pertinent labs & imaging results that were available during my care of the patient were reviewed by me and considered in my medical decision making (see chart for details).     EKG reassuring, low risk history and low HEAR score. PERC negative. Not consistent with dangerous arrhythmia. Labs reassuring. No PNA on CXR. Tele monitored and reassuring during ED eval. Delta trop negative. Home with close fu, strict return precautions. Advised to fu with pcp regarding hyperglycemia/noncompliance with  metformin.  Final Clinical Impressions(s) / ED Diagnoses   Final diagnoses:  Hyperglycemia  Chest pain, unspecified type    New Prescriptions Discharge Medication List as of 06/21/2016  9:15 PM       Karma Greaser, MD 06/22/16 0020    Lajean Saver, MD 06/27/16 [phone]

## 2016-06-28 ENCOUNTER — Encounter (HOSPITAL_COMMUNITY): Payer: Self-pay | Admitting: Emergency Medicine

## 2016-06-28 ENCOUNTER — Emergency Department (HOSPITAL_COMMUNITY): Payer: Self-pay

## 2016-06-28 ENCOUNTER — Emergency Department (HOSPITAL_COMMUNITY)
Admission: EM | Admit: 2016-06-28 | Discharge: 2016-06-28 | Disposition: A | Discharge status: Home / Self Care | Payer: Self-pay | Attending: Emergency Medicine | Admitting: Emergency Medicine

## 2016-06-28 DIAGNOSIS — E119 Type 2 diabetes mellitus without complications: Secondary | ICD-10-CM | POA: Insufficient documentation

## 2016-06-28 DIAGNOSIS — K2100 Gastro-esophageal reflux disease with esophagitis, without bleeding: Secondary | ICD-10-CM

## 2016-06-28 DIAGNOSIS — J45909 Unspecified asthma, uncomplicated: Secondary | ICD-10-CM | POA: Insufficient documentation

## 2016-06-28 DIAGNOSIS — K0889 Other specified disorders of teeth and supporting structures: Secondary | ICD-10-CM | POA: Insufficient documentation

## 2016-06-28 DIAGNOSIS — K219 Gastro-esophageal reflux disease without esophagitis: Secondary | ICD-10-CM | POA: Insufficient documentation

## 2016-06-28 DIAGNOSIS — Z79899 Other long term (current) drug therapy: Secondary | ICD-10-CM | POA: Insufficient documentation

## 2016-06-28 DIAGNOSIS — K209 Esophagitis, unspecified: Secondary | ICD-10-CM | POA: Insufficient documentation

## 2016-06-28 DIAGNOSIS — Z7984 Long term (current) use of oral hypoglycemic drugs: Secondary | ICD-10-CM | POA: Insufficient documentation

## 2016-06-28 LAB — BASIC METABOLIC PANEL
Anion gap: 7 (ref 5–15)
BUN: 9 mg/dL (ref 6–20)
CO2: 25 mmol/L (ref 22–32)
Calcium: 9.8 mg/dL (ref 8.9–10.3)
Chloride: 107 mmol/L (ref 101–111)
Creatinine, Ser: 0.88 mg/dL (ref 0.44–1.00)
GFR calc Af Amer: >60 mL/min (ref >60)
GFR calc non Af Amer: >60 mL/min (ref >60)
Glucose, Bld: 145 mg/dL — ABNORMAL HIGH (ref 65–99)
Potassium: 3.9 mmol/L (ref 3.5–5.1)
Sodium: 139 mmol/L (ref 135–145)

## 2016-06-28 LAB — CBC
HCT: 37.7 % (ref 36.0–46.0)
Hemoglobin: 12.6 g/dL (ref 12.0–15.0)
MCH: 25.3 pg — ABNORMAL LOW (ref 26.0–34.0)
MCHC: 33.4 g/dL (ref 30.0–36.0)
MCV: 75.6 fL — ABNORMAL LOW (ref 78.0–100.0)
Platelets: 234 10*3/uL (ref 150–400)
RBC: 4.99 MIL/uL (ref 3.87–5.11)
RDW: 13.5 % (ref 11.5–15.5)
WBC: 6.5 10*3/uL (ref 4.0–10.5)

## 2016-06-28 LAB — I-STAT TROPONIN, ED: Troponin i, poc: 0 ng/mL (ref 0.00–0.08)

## 2016-06-28 MED ORDER — AMOXICILLIN 500 MG PO CAPS: 1000.0000 mg | ORAL_CAPSULE | Freq: Two times a day (BID) | ORAL | 0 refills | Status: DC

## 2016-06-28 MED ORDER — CETIRIZINE HCL 5 MG PO TABS: 5.0000 mg | ORAL_TABLET | Freq: Every day | ORAL | 0 refills | Status: DC

## 2016-06-28 MED ORDER — GI COCKTAIL ~~LOC~~
30.0000 mL | Freq: Once | ORAL | Status: AC
  Administered 2016-06-28: 30 mL via ORAL
  Filled 2016-06-28: qty 30

## 2016-06-28 MED ORDER — SUCRALFATE 1 G PO TABS
1.0000 g | ORAL_TABLET | Freq: Three times a day (TID) | ORAL | 0 refills | Status: DC
Start: 2016-06-28 — End: 2017-06-22

## 2016-06-28 NOTE — Discharge Instructions (Signed)
Please follow with your primary care doctor in the next 2 days for a check-up. They must obtain records for further management.  ° °Do not hesitate to return to the Emergency Department for any new, worsening or concerning symptoms.  ° °

## 2016-06-28 NOTE — ED Provider Notes (Signed)
[facility] DEPT Provider Note   CSN: [id number] Arrival date & time: 06/28/16  1423     History   Chief Complaint Chief Complaint  Patient presents with  . Chest Pain      HPI   Blood pressure 143/67, pulse (!) 59, temperature 98.6 F (37 C), temperature source Oral, resp. rate 16, height _0  (1.575 m), weight 83 kg, last menstrual period 06/05/2016, SpO2 100 %.  [name] is a 31 y.o. female complaining of chest pain intermittently over the course of several weeks its retrosternal, nonradiating and exacerbated by laying supine. She denies fever, chills, cough, history of DVT/PE, recent immobilizations, calf pain, leg swelling, exogenous estrogen. She does have diabetes but she denies tobacco use, methamphetamine or cocaine use, family history of early cardiac death, hypertension, hyperlipidemia. She denies any associated diaphoresis or shortness of breath, nausea or vomiting. She does states that she feels lightheaded. She also notes a left lower jaw until pain and generalized headache.   Past Medical History:  Diagnosis Date  . Asthma   . Diabetes mellitus without complication ([place])   . Obesity     There are no active problems to display for this patient.   Past Surgical History:  Procedure Laterality Date  . CESAREAN SECTION    . TUBAL LIGATION      OB History    No data available       Home Medications    Prior to Admission medications   Medication Sig Start Date End Date Taking? Authorizing Provider  acetaminophen (TYLENOL) 325 MG tablet Take 325 mg by mouth every 6 (six) hours as needed for moderate pain or headache.   Yes Historical Provider, MD  esomeprazole (NEXIUM) 40 MG capsule Take 40 mg by mouth daily at 12 noon.   Yes Historical Provider, MD  amoxicillin (AMOXIL) 500 MG capsule Take 2 capsules (1,000 mg total) by mouth 2 (two) times daily. 06/28/16   Ann-Marie Kluge, PA-C  benzonatate (TESSALON) 100 MG capsule Take 1 capsule (100 mg  total) by mouth 3 (three) times daily as needed for cough. Patient not taking: Reported on 06/28/2016 05/01/16   Quintella Reichert, MD  Blood Glucose Monitoring Suppl (TRUE METRIX METER) w/Device KIT 1 each by Does not apply route 4 (four) times daily -  before meals and at bedtime. 12/09/15   Tresa Garter, MD  cetirizine (ZYRTEC) 5 MG tablet Take 1 tablet (5 mg total) by mouth daily. 06/28/16   Loden Laurent, PA-C  glucose blood (TRUE METRIX BLOOD GLUCOSE TEST) test strip Use as instructed 12/09/15   Tresa Garter, MD  ibuprofen (ADVIL,MOTRIN) 600 MG tablet Take 1 tablet (600 mg total) by mouth every 6 (six) hours as needed. Patient not taking: Reported on 06/28/2016 12/16/15   Nona Dell, PA-C  metFORMIN (GLUCOPHAGE) 500 MG tablet Take 2 tablets (1,000 mg total) by mouth daily with breakfast. Patient not taking: Reported on 06/28/2016 12/13/15   Maren Reamer, MD  sucralfate (CARAFATE) 1 g tablet Take 1 tablet (1 g total) by mouth 4 (four) times daily -  with meals and at bedtime. 06/28/16   Zahid Carneiro, PA-C  TRUEPLUS LANCETS 28G MISC 1 each by Does not apply route 4 (four) times daily - after meals and at bedtime. 12/09/15   Tresa Garter, MD    Family History Family History  Problem Relation Age of Onset  . Diabetes Maternal Aunt     Social History Social History  Substance Use  Topics  . Smoking status: Never Smoker  . Smokeless tobacco: Never Used  . Alcohol use No     Allergies   Cefdinir and Other   Review of Systems Review of Systems  10 systems reviewed and found to be negative, except as noted in the HPI.   Physical Exam Updated Vital Signs BP 143/67 (BP Location: Right Arm)   Pulse (!) 59   Temp 98.6 F (37 C) (Oral)   Resp 16   Ht _0  (1.575 m)   Wt 83 kg   LMP 06/05/2016   SpO2 100%   BMI 33.47 kg/m   Physical Exam  Constitutional: She is oriented to person, place, and time. She appears well-developed and well-nourished.  No distress.  HENT:  Head: Normocephalic.  Mouth/Throat: Uvula is midline and oropharynx is clear and moist. No trismus in the jaw. No uvula swelling. No oropharyngeal exudate, posterior oropharyngeal edema, posterior oropharyngeal erythema or tonsillar abscesses.  Generally good dentition, no gingival swelling, erythema or tenderness to palpation. Patient is handling their secretions. There is no tenderness to palpation or firmness underneath tongue bilaterally. No trismus.    Eyes: Conjunctivae and EOM are normal.  Neck: Normal range of motion. No JVD present. No tracheal deviation present.  Cardiovascular: Normal rate, regular rhythm and intact distal pulses.   Radial pulse equal bilaterally  Pulmonary/Chest: Effort normal and breath sounds normal. No stridor. No respiratory distress. She has no wheezes. She has no rales. She exhibits no tenderness.  Abdominal: Soft. She exhibits no distension and no mass. There is no tenderness. There is no rebound and no guarding.  Musculoskeletal: Normal range of motion. She exhibits no edema or tenderness.  No calf asymmetry, superficial collaterals, palpable cords, edema, Homans sign negative bilaterally.    Lymphadenopathy:    She has no cervical adenopathy.  Neurological: She is alert and oriented to person, place, and time.  Skin: Skin is warm. She is not diaphoretic.  Psychiatric: She has a normal mood and affect.  Nursing note and vitals reviewed.    ED Treatments / Results  Labs (all labs ordered are listed, but only abnormal results are displayed) Labs Reviewed  BASIC METABOLIC PANEL - Abnormal; Notable for the following:       Result Value   Glucose, Bld 145 (*)    All other components within normal limits  CBC - Abnormal; Notable for the following:    MCV 75.6 (*)    MCH 25.3 (*)    All other components within normal limits  I-STAT TROPOININ, ED    EKG  EKG Interpretation  Date/Time:  Wednesday June 28 2016 14:29:12  EST Ventricular Rate:  69 PR Interval:    QRS Duration: 77 QT Interval:  395 QTC Calculation: 424 R Axis:   60 Text Interpretation:  Sinus arrhythmia No significant change since last tracing Confirmed by Wilson Singer  MD, STEPHEN [id number]) on 06/28/2016 4:16:45 PM       Radiology Dg Chest 2 View  Result Date: 06/28/2016 CLINICAL DATA:  Increasing chest discomfort and shortness of breath over the past week. No history of smoking. History of asthma, diabetes, and obesity. EXAM: CHEST  2 VIEW COMPARISON:  PA and lateral chest x-ray of June 21, 2016 FINDINGS: The lungs remain mildly hypoinflated. There is no focal infiltrate. There is no pleural effusion. The heart and pulmonary vascularity are normal. The mediastinum is normal in width. The bony thorax exhibits no acute abnormality. IMPRESSION: There is no pneumonia nor other  acute cardiopulmonary abnormality. Electronically Signed   By: David  Martinique M.D.   On: 06/28/2016 15:12    Procedures Procedures (including critical care time)  Medications Ordered in ED Medications  gi cocktail (Maalox,Lidocaine,Donnatal) (30 mLs Oral Given 06/28/16 1529)     Initial Impression / Assessment and Plan / ED Course  I have reviewed the triage vital signs and the nursing notes.  Pertinent labs & imaging results that were available during my care of the patient were reviewed by me and considered in my medical decision making (see chart for details).    . Vitals:   06/28/16 1429 06/28/16 1430  BP: 143/67   Pulse: (!) 59   Resp: 16   Temp: 98.6 F (37 C)   TempSrc: Oral   SpO2: 100% 100%  Weight:  83 kg  Height:  _0  (1.575 m)    Medications  gi cocktail (Maalox,Lidocaine,Donnatal) (30 mLs Oral Given 06/28/16 1529)    Sadi Arave is 31 y.o. female presenting with Atypical chest pain exacerbated when laying supine. This may be secondary to acid reflux. EKG with sinus arrhythmia, negative. Patient is low risk by heart score. Low risk by  well's score and PERC negative. She feels significantly improved after GI cocktail, will add H2 blocker to her PPI, she also reports a dental pain however there is no gross infection, will start her on amoxicillin and patient is given dental resource guide.  Evaluation does not show pathology that would require ongoing emergent intervention or inpatient treatment. Pt is hemodynamically stable and mentating appropriately. Discussed findings and plan with patient/guardian, who agrees with care plan. All questions answered. Return precautions discussed and outpatient follow up given.     Final Clinical Impressions(s) / ED Diagnoses   Final diagnoses:  Gastroesophageal reflux disease with esophagitis  Pain, dental    New Prescriptions New Prescriptions   AMOXICILLIN (AMOXIL) 500 MG CAPSULE    Take 2 capsules (1,000 mg total) by mouth 2 (two) times daily.   CETIRIZINE (ZYRTEC) 5 MG TABLET    Take 1 tablet (5 mg total) by mouth daily.   SUCRALFATE (CARAFATE) 1 G TABLET    Take 1 tablet (1 g total) by mouth 4 (four) times daily -  with meals and at bedtime.     Monico Blitz, PA-C 06/28/16 [place], MD 07/14/16 [phone]

## 2016-06-28 NOTE — ED Triage Notes (Signed)
Patient is complaining of chest pain which radiates to her left hand,  She has numbness in her left hand.  Patient is c/o centralized chest pain, shortness of breath, and dizziness.    Pain does radiate to left hand.

## 2017-01-25 ENCOUNTER — Encounter (HOSPITAL_COMMUNITY): Payer: Self-pay | Admitting: Emergency Medicine

## 2017-01-25 DIAGNOSIS — K047 Periapical abscess without sinus: Secondary | ICD-10-CM | POA: Insufficient documentation

## 2017-01-25 DIAGNOSIS — J45909 Unspecified asthma, uncomplicated: Secondary | ICD-10-CM | POA: Insufficient documentation

## 2017-01-25 DIAGNOSIS — Z79899 Other long term (current) drug therapy: Secondary | ICD-10-CM | POA: Insufficient documentation

## 2017-01-25 DIAGNOSIS — R739 Hyperglycemia, unspecified: Secondary | ICD-10-CM | POA: Insufficient documentation

## 2017-01-25 LAB — URINALYSIS, ROUTINE W REFLEX MICROSCOPIC
Bilirubin Urine: NEGATIVE
Hgb urine dipstick: NEGATIVE
Ketones, ur: 20 mg/dL — AB
Leukocytes, UA: NEGATIVE
Nitrite: NEGATIVE
PH: 5 (ref 5.0–8.0)
PROTEIN: NEGATIVE mg/dL
RBC / HPF: NONE SEEN RBC/hpf (ref 0–5)
Specific Gravity, Urine: 1.01 (ref 1.005–1.030)

## 2017-01-25 LAB — COMPREHENSIVE METABOLIC PANEL
ALBUMIN: 3.5 g/dL (ref 3.5–5.0)
ALK PHOS: 59 U/L (ref 38–126)
ALT: 13 U/L — ABNORMAL LOW (ref 14–54)
AST: 16 U/L (ref 15–41)
Anion gap: 10 (ref 5–15)
BILIRUBIN TOTAL: 0.7 mg/dL (ref 0.3–1.2)
BUN: 17 mg/dL (ref 6–20)
CALCIUM: 9.1 mg/dL (ref 8.9–10.3)
CO2: 20 mmol/L — ABNORMAL LOW (ref 22–32)
CREATININE: 1.02 mg/dL — AB (ref 0.44–1.00)
Chloride: 103 mmol/L (ref 101–111)
GFR calc Af Amer: >60 mL/min (ref >60)
GFR calc non Af Amer: >60 mL/min (ref >60)
GLUCOSE: 208 mg/dL — AB (ref 65–99)
Potassium: 3.7 mmol/L (ref 3.5–5.1)
Sodium: 133 mmol/L — ABNORMAL LOW (ref 135–145)
TOTAL PROTEIN: 6.7 g/dL (ref 6.5–8.1)

## 2017-01-25 LAB — CBC WITH DIFFERENTIAL/PLATELET
BASOS ABS: 0 10*3/uL (ref 0.0–0.1)
BASOS PCT: 0 %
Eosinophils Absolute: 0.1 10*3/uL (ref 0.0–0.7)
Eosinophils Relative: 2 %
HEMATOCRIT: 36.1 % (ref 36.0–46.0)
HEMOGLOBIN: 11.7 g/dL — AB (ref 12.0–15.0)
LYMPHS PCT: 28 %
Lymphs Abs: 1.9 10*3/uL (ref 0.7–4.0)
MCH: 24.3 pg — ABNORMAL LOW (ref 26.0–34.0)
MCHC: 32.4 g/dL (ref 30.0–36.0)
MCV: 75.1 fL — ABNORMAL LOW (ref 78.0–100.0)
Monocytes Absolute: 0.5 10*3/uL (ref 0.1–1.0)
Monocytes Relative: 7 %
NEUTROS ABS: 4.4 10*3/uL (ref 1.7–7.7)
NEUTROS PCT: 63 %
Platelets: 206 10*3/uL (ref 150–400)
RBC: 4.81 MIL/uL (ref 3.87–5.11)
RDW: 13.6 % (ref 11.5–15.5)
WBC: 6.9 10*3/uL (ref 4.0–10.5)

## 2017-01-25 LAB — I-STAT BETA HCG BLOOD, ED (MC, WL, AP ONLY): I-stat hCG, quantitative: <5 m[IU]/mL (ref <5)

## 2017-01-25 LAB — CBG MONITORING, ED: GLUCOSE-CAPILLARY: 212 mg/dL — AB (ref 65–99)

## 2017-01-25 NOTE — ED Triage Notes (Signed)
Patient reports hyperglycemia this evening at home 300+ she has not taken diabetic medications for several months , denies fever , no emesis or diarrhea .

## 2017-01-26 ENCOUNTER — Emergency Department (HOSPITAL_COMMUNITY)
Admission: EM | Admit: 2017-01-26 | Discharge: 2017-01-26 | Disposition: A | Discharge status: Home / Self Care | Payer: Self-pay | Attending: Emergency Medicine | Admitting: Emergency Medicine

## 2017-01-26 DIAGNOSIS — K047 Periapical abscess without sinus: Secondary | ICD-10-CM

## 2017-01-26 DIAGNOSIS — R739 Hyperglycemia, unspecified: Secondary | ICD-10-CM

## 2017-01-26 LAB — CBG MONITORING, ED: Glucose-Capillary: 203 mg/dL — ABNORMAL HIGH (ref 65–99)

## 2017-01-26 MED ORDER — METFORMIN HCL 500 MG PO TABS: 500.0000 mg | ORAL_TABLET | Freq: Two times a day (BID) | ORAL | 0 refills | Status: DC

## 2017-01-26 MED ORDER — CLINDAMYCIN HCL 300 MG PO CAPS: 300.0000 mg | ORAL_CAPSULE | Freq: Three times a day (TID) | ORAL | 0 refills | Status: DC

## 2017-01-26 MED ORDER — CLINDAMYCIN HCL 150 MG PO CAPS
300.0000 mg | ORAL_CAPSULE | Freq: Once | ORAL | Status: AC
  Administered 2017-01-26: 300 mg via ORAL
  Filled 2017-01-26: qty 2

## 2017-01-26 MED ORDER — AMOXICILLIN-POT CLAVULANATE 875-125 MG PO TABS: 1.0000 | ORAL_TABLET | Freq: Once | ORAL | Status: DC

## 2017-01-26 MED ORDER — METFORMIN HCL 500 MG PO TABS
500.0000 mg | ORAL_TABLET | Freq: Once | ORAL | Status: AC
  Administered 2017-01-26: 500 mg via ORAL
  Filled 2017-01-26: qty 1

## 2017-01-26 NOTE — Care Management Note (Signed)
Case Management Note  CM consulted for no pcp no ins.  Pt was D/C prior to CM being able to speak with pt.  Dr Silverio Lay advised he provided her with [facility] information for follow up.  CM has been unable to get appointments for people this week, so pt will need to call on her own. Called pt to access her understanding and left a VM encouraging her to continue to call until she gets an appointment time. No further CM needs noted.

## 2017-01-26 NOTE — ED Provider Notes (Signed)
[facility] DEPT Provider Note   CSN: [id number] Arrival date & time: 01/25/17  2101     History   Chief Complaint Chief Complaint  Patient presents with  . Hyperglycemia    HPI [name] is a 31 y.o. female hx of DM, asthma, Here with hyperglycemia. Patient states that she ran out of her metformin several months ago as an progressively having urinary frequency for the last several weeks. Patient has been feeling very thirsty recently as well. Yesterday she checked her sugar and was ranging from 200 before meals and about 300 after she eats. She felt nauseated but denies any vomiting or abdominal pain or fevers. Denies being pregnant. She also noticed some swelling left side of the neck and some tooth ache for several days.    The history is provided by the patient.    Past Medical History:  Diagnosis Date  . Asthma   . Diabetes mellitus without complication ([place])   . Obesity     There are no active problems to display for this patient.   Past Surgical History:  Procedure Laterality Date  . CESAREAN SECTION    . TUBAL LIGATION      OB History    No data available       Home Medications    Prior to Admission medications   Medication Sig Start Date End Date Taking? Authorizing Provider  acetaminophen (TYLENOL) 325 MG tablet Take 325 mg by mouth every 6 (six) hours as needed for moderate pain or headache.    [provider]  amoxicillin (AMOXIL) 500 MG capsule Take 2 capsules (1,000 mg total) by mouth 2 (two) times daily. 06/28/16   Pisciotta, Elmyra Ricks, PA-C  benzonatate (TESSALON) 100 MG capsule Take 1 capsule (100 mg total) by mouth 3 (three) times daily as needed for cough. Patient not taking: Reported on 06/28/2016 05/01/16   Quintella Reichert, MD  Blood Glucose Monitoring Suppl (TRUE METRIX METER) w/Device KIT 1 each by Does not apply route 4 (four) times daily -  before meals and at bedtime. 12/09/15   Tresa Garter, MD  cetirizine (ZYRTEC) 5 MG  tablet Take 1 tablet (5 mg total) by mouth daily. 06/28/16   Pisciotta, Elmyra Ricks, PA-C  esomeprazole (NEXIUM) 40 MG capsule Take 40 mg by mouth daily at 12 noon.    [provider]  glucose blood (TRUE METRIX BLOOD GLUCOSE TEST) test strip Use as instructed 12/09/15   Tresa Garter, MD  ibuprofen (ADVIL,MOTRIN) 600 MG tablet Take 1 tablet (600 mg total) by mouth every 6 (six) hours as needed. Patient not taking: Reported on 06/28/2016 12/16/15   Nona Dell, PA-C  metFORMIN (GLUCOPHAGE) 500 MG tablet Take 2 tablets (1,000 mg total) by mouth daily with breakfast. Patient not taking: Reported on 06/28/2016 12/13/15   Maren Reamer, MD  sucralfate (CARAFATE) 1 g tablet Take 1 tablet (1 g total) by mouth 4 (four) times daily -  with meals and at bedtime. 06/28/16   Pisciotta, Elmyra Ricks, PA-C  TRUEPLUS LANCETS 28G MISC 1 each by Does not apply route 4 (four) times daily - after meals and at bedtime. 12/09/15   Tresa Garter, MD    Family History Family History  Problem Relation Age of Onset  . Diabetes Maternal Aunt     Social History Social History  Substance Use Topics  . Smoking status: Never Smoker  . Smokeless tobacco: Never Used  . Alcohol use No     Allergies  Cefdinir and Other   Review of Systems Review of Systems  Genitourinary: Positive for frequency.  All other systems reviewed and are negative.    Physical Exam Updated Vital Signs BP 112/70 (BP Location: Left Arm)   Pulse 71   Temp 97.9 F (36.6 C) (Oral)   Resp 16   Ht _0  (1.575 m)   Wt 86.2 kg (190 lb)   LMP 01/03/2017 (Approximate)   SpO2 100%   BMI 34.75 kg/m   Physical Exam  Constitutional: She is oriented to person, place, and time. She appears well-developed.  HENT:  Head: Normocephalic.  MM slightly dry. Missing L upper premolar with rotted root. No obvious periapical abscess. Mild L posterior cervical LAD. TM nl bilaterally   Eyes: Pupils are equal, round, and  reactive to light. Conjunctivae and EOM are normal.  Neck: Normal range of motion. Neck supple.  Cardiovascular: Normal rate, regular rhythm and normal heart sounds.   Pulmonary/Chest: Effort normal and breath sounds normal. No respiratory distress. She has no wheezes. She has no rales.  Abdominal: Soft. Bowel sounds are normal. She exhibits no distension. There is no tenderness. There is no guarding.  Musculoskeletal: Normal range of motion.  Neurological: She is alert and oriented to person, place, and time. No cranial nerve deficit. Coordination normal.  Skin: Skin is warm.  Psychiatric: She has a normal mood and affect.  Nursing note and vitals reviewed.    ED Treatments / Results  Labs (all labs ordered are listed, but only abnormal results are displayed) Labs Reviewed  CBC WITH DIFFERENTIAL/PLATELET - Abnormal; Notable for the following:       Result Value   Hemoglobin 11.7 (*)    MCV 75.1 (*)    MCH 24.3 (*)    All other components within normal limits  COMPREHENSIVE METABOLIC PANEL - Abnormal; Notable for the following:    Sodium 133 (*)    CO2 20 (*)    Glucose, Bld 208 (*)    Creatinine, Ser 1.02 (*)    ALT 13 (*)    All other components within normal limits  URINALYSIS, ROUTINE W REFLEX MICROSCOPIC - Abnormal; Notable for the following:    Color, Urine STRAW (*)    Glucose, UA >=500 (*)    Ketones, ur 20 (*)    Bacteria, UA RARE (*)    Squamous Epithelial / LPF 0-5 (*)    All other components within normal limits  CBG MONITORING, ED - Abnormal; Notable for the following:    Glucose-Capillary 212 (*)    All other components within normal limits  CBG MONITORING, ED - Abnormal; Notable for the following:    Glucose-Capillary 203 (*)    All other components within normal limits  I-STAT BETA HCG BLOOD, ED ([facility], [facility], [facility] ONLY)  CBG MONITORING, ED    EKG  EKG Interpretation None       Radiology No results found.  Procedures Procedures (including critical  care time)  Medications Ordered in ED Medications  metFORMIN (GLUCOPHAGE) tablet 500 mg (not administered)     Initial Impression / Assessment and Plan / ED Course  I have reviewed the triage vital signs and the nursing notes.  Pertinent labs & imaging results that were available during my care of the patient were reviewed by me and considered in my medical decision making (see chart for details).    [name] is a 31 y.o. female here with nausea, urinary frequency, hyperglycemia. Has no PCP currently. Will  check labs, UCG, UA. There is some L cervical LAD likely from dental infection so will give clinda (cephalosporin allergy). No obvious drainable abscess.   9:20 AM Patient's CBG is 208. Nl AG. Refilled her metformin. Will have [facility]    Final Clinical Impressions(s) / ED Diagnoses   Final diagnoses:  None    New Prescriptions New Prescriptions   No medications on file     Drenda Freeze, MD 01/26/17 [phone]

## 2017-01-26 NOTE — Discharge Instructions (Signed)
Take metformin 500 mg twice daily.   Take clindamycin for tooth infection as prescribed.   See [facility] for follow up.   Call a dentist regarding your dental infection for evaluation   Return to ER if you have glucose > 500, vomiting, fever, worse neck pain or swelling, abdominal pain.

## 2017-01-26 NOTE — ED Notes (Signed)
CBG: 203. RN notified 

## 2017-04-30 ENCOUNTER — Encounter (HOSPITAL_COMMUNITY): Payer: Self-pay | Admitting: Neurology

## 2017-04-30 ENCOUNTER — Other Ambulatory Visit: Payer: Self-pay

## 2017-04-30 ENCOUNTER — Ambulatory Visit: Payer: Self-pay | Admitting: *Deleted

## 2017-04-30 ENCOUNTER — Emergency Department (HOSPITAL_COMMUNITY)
Admission: EM | Admit: 2017-04-30 | Discharge: 2017-04-30 | Discharge status: Against Medical Advice | Payer: Self-pay | Attending: Emergency Medicine | Admitting: Emergency Medicine

## 2017-04-30 DIAGNOSIS — Z5321 Procedure and treatment not carried out due to patient leaving prior to being seen by health care provider: Secondary | ICD-10-CM | POA: Insufficient documentation

## 2017-04-30 DIAGNOSIS — E1165 Type 2 diabetes mellitus with hyperglycemia: Secondary | ICD-10-CM | POA: Insufficient documentation

## 2017-04-30 LAB — CBC
HCT: 38.3 % (ref 36.0–46.0)
Hemoglobin: 12.7 g/dL (ref 12.0–15.0)
MCH: 24.8 pg — AB (ref 26.0–34.0)
MCHC: 33.2 g/dL (ref 30.0–36.0)
MCV: 74.8 fL — ABNORMAL LOW (ref 78.0–100.0)
PLATELETS: 208 10*3/uL (ref 150–400)
RBC: 5.12 MIL/uL — AB (ref 3.87–5.11)
RDW: 13.9 % (ref 11.5–15.5)
WBC: 6 10*3/uL (ref 4.0–10.5)

## 2017-04-30 LAB — BASIC METABOLIC PANEL
Anion gap: 8 (ref 5–15)
BUN: 8 mg/dL (ref 6–20)
CALCIUM: 9.3 mg/dL (ref 8.9–10.3)
CO2: 22 mmol/L (ref 22–32)
CREATININE: 0.82 mg/dL (ref 0.44–1.00)
Chloride: 104 mmol/L (ref 101–111)
GFR calc non Af Amer: >60 mL/min (ref >60)
Glucose, Bld: 244 mg/dL — ABNORMAL HIGH (ref 65–99)
Potassium: 4.1 mmol/L (ref 3.5–5.1)
SODIUM: 134 mmol/L — AB (ref 135–145)

## 2017-04-30 LAB — I-STAT BETA HCG BLOOD, ED (MC, WL, AP ONLY)

## 2017-04-30 LAB — CBG MONITORING, ED: GLUCOSE-CAPILLARY: 229 mg/dL — AB (ref 65–99)

## 2017-04-30 NOTE — Telephone Encounter (Signed)
Pt  Has  No  pcp    Out  Of  Metformin  X  2  Days .  Pt   Reports  No  Symptoms    Other  Than  Frequent  Urination  .     Pt   Has  Not  Checked  Her  Ketones .  Reason for Disposition . [1] Blood glucose > 240 mg/dl (13 mmol/l) AND [8][2] urine ketones moderate-large (or more than 1+)  Answer Assessment - Initial Assessment Questions 1. BLOOD GLUCOSE: "What is your blood glucose level?" 345     2. ONSET: "When did you check the blood glucose?"       About  1  Hour  ago 3. USUAL RANGE: "What is your glucose level usually?" (e.g., usual fasting morning value, usual evening value)        150  morning 4. KETONES: "Do you check for ketones (urine or blood test strips)?" If yes, ask: "What does the test show now?"           Have not  Checked  recently 5. TYPE 1 or 2:  "Do you know what type of diabetes you have?"  (e.g., Type 1, Type 2, Gestational; doesn't know)        Type  2   6. INSULIN: "Do you take insulin?" If yes, ask: "Have you missed any shots recently?"      No 7. DIABETES PILLS: "Do you take any pills for your diabetes?" If yes, ask: "Have you missed taking any pills recently?"       Ran  Out  2  Days   ago 8. OTHER SYMPTOMS: "Do you have any symptoms?" (e.g., fever, frequent urination, difficulty breathing, dizziness, weakness, vomiting)       Frequent  Urination   9. PREGNANCY: "Is there any chance you are pregnant?" "When was your last menstrual period?"      Dec 1  Protocols used: DIABETES - HIGH BLOOD SUGAR-A-AH

## 2017-04-30 NOTE — ED Notes (Signed)
Pt's CBG result was 229. Informed Sarah - RN.

## 2017-04-30 NOTE — ED Triage Notes (Signed)
Pt reports high blood sugars at home, (454 yesterday, 354 today) ran out of metformin 4 days ago. Denies n/v/d. CBG is 229 today. Is type 2 DM

## 2017-06-22 ENCOUNTER — Other Ambulatory Visit: Payer: Self-pay

## 2017-06-22 ENCOUNTER — Encounter (HOSPITAL_COMMUNITY): Payer: Self-pay | Admitting: Emergency Medicine

## 2017-06-22 ENCOUNTER — Emergency Department (HOSPITAL_COMMUNITY)
Admission: EM | Admit: 2017-06-22 | Discharge: 2017-06-22 | Disposition: A | Discharge status: Home / Self Care | Payer: Self-pay | Attending: Emergency Medicine | Admitting: Emergency Medicine

## 2017-06-22 DIAGNOSIS — J45909 Unspecified asthma, uncomplicated: Secondary | ICD-10-CM | POA: Insufficient documentation

## 2017-06-22 DIAGNOSIS — E1165 Type 2 diabetes mellitus with hyperglycemia: Secondary | ICD-10-CM | POA: Insufficient documentation

## 2017-06-22 DIAGNOSIS — R739 Hyperglycemia, unspecified: Secondary | ICD-10-CM

## 2017-06-22 LAB — URINALYSIS, ROUTINE W REFLEX MICROSCOPIC
BILIRUBIN URINE: NEGATIVE
Glucose, UA: >=500 mg/dL — AB
Hgb urine dipstick: NEGATIVE
Ketones, ur: 20 mg/dL — AB
Leukocytes, UA: NEGATIVE
Nitrite: NEGATIVE
Protein, ur: NEGATIVE mg/dL
SPECIFIC GRAVITY, URINE: 1.021 (ref 1.005–1.030)
pH: 5 (ref 5.0–8.0)

## 2017-06-22 LAB — BASIC METABOLIC PANEL
Anion gap: 11 (ref 5–15)
BUN: 9 mg/dL (ref 6–20)
CO2: 23 mmol/L (ref 22–32)
CREATININE: 0.64 mg/dL (ref 0.44–1.00)
Calcium: 9.5 mg/dL (ref 8.9–10.3)
Chloride: 106 mmol/L (ref 101–111)
GFR calc Af Amer: >60 mL/min (ref >60)
Glucose, Bld: 257 mg/dL — ABNORMAL HIGH (ref 65–99)
POTASSIUM: 4.1 mmol/L (ref 3.5–5.1)
SODIUM: 140 mmol/L (ref 135–145)

## 2017-06-22 LAB — POC URINE PREG, ED: PREG TEST UR: NEGATIVE

## 2017-06-22 LAB — CBG MONITORING, ED: Glucose-Capillary: 255 mg/dL — ABNORMAL HIGH (ref 65–99)

## 2017-06-22 MED ORDER — METFORMIN HCL 500 MG PO TABS: 500.0000 mg | ORAL_TABLET | Freq: Two times a day (BID) | ORAL | 0 refills | Status: DC

## 2017-06-22 MED ORDER — SODIUM CHLORIDE 0.9 % IV BOLUS (SEPSIS)
1000.0000 mL | Freq: Once | INTRAVENOUS | Status: AC
  Administered 2017-06-22: 1000 mL via INTRAVENOUS

## 2017-06-22 NOTE — ED Provider Notes (Signed)
[facility] DEPT Provider Note   CSN: [id number] Arrival date & time: 06/22/17  7829     History   Chief Complaint Chief Complaint  Patient presents with  . Hyperglycemia    HPI [name] is a 32 y.o. female.  32yo F w/ PMH including T2DM, asthma who p/w hyperglycemia.  Reports being out of her metformin for the past 2 months.  She has noticed increase urination and increased thirst recently, checked her blood sugar yesterday and it was 500.  It has been in the 200s this morning.  She denies any recent infectious symptoms including no cough/cold, fevers, urinary symptoms, vomiting, or diarrhea.  She has had some mild nausea.   The history is provided by the patient.  Hyperglycemia    Past Medical History:  Diagnosis Date  . Asthma   . Diabetes mellitus without complication ([place])   . Obesity     There are no active problems to display for this patient.   Past Surgical History:  Procedure Laterality Date  . CESAREAN SECTION    . TUBAL LIGATION      OB History    No data available       Home Medications    Prior to Admission medications   Medication Sig Start Date End Date Taking? Authorizing Provider  Blood Glucose Monitoring Suppl (TRUE METRIX METER) w/Device KIT 1 each by Does not apply route 4 (four) times daily -  before meals and at bedtime. Patient not taking: Reported on 06/22/2017 12/09/15   Tresa Garter, MD  cetirizine (ZYRTEC) 5 MG tablet Take 1 tablet (5 mg total) by mouth daily. Patient not taking: Reported on 06/22/2017 06/28/16   Pisciotta, Elmyra Ricks, PA-C  glucose blood (TRUE METRIX BLOOD GLUCOSE TEST) test strip Use as instructed Patient not taking: Reported on 06/22/2017 12/09/15   Tresa Garter, MD  metFORMIN (GLUCOPHAGE) 500 MG tablet Take 1 tablet (500 mg total) by mouth 2 (two) times daily with a meal. 06/22/17   Little, Wenda Overland, MD  TRUEPLUS LANCETS 28G MISC 1 each by Does not apply route 4  (four) times daily - after meals and at bedtime. Patient not taking: Reported on 06/22/2017 12/09/15   Tresa Garter, MD    Family History Family History  Problem Relation Age of Onset  . Diabetes Maternal Aunt     Social History Social History   Tobacco Use  . Smoking status: Never Smoker  . Smokeless tobacco: Never Used  Substance Use Topics  . Alcohol use: No  . Drug use: No     Allergies   Cefdinir and Other   Review of Systems Review of Systems All other systems reviewed and are negative except that which was mentioned in HPI   Physical Exam Updated Vital Signs BP 118/67 (BP Location: Right Arm)   Pulse (!) 52   Temp 97.6 F (36.4 C) (Oral)   Resp 18   LMP 06/10/2017 (Approximate)   SpO2 100%   Physical Exam  Constitutional: She is oriented to person, place, and time. She appears well-developed and well-nourished. No distress.  HENT:  Head: Normocephalic and atraumatic.  Moist mucous membranes  Eyes: Conjunctivae are normal.  Neck: Neck supple.  Cardiovascular: Normal rate, regular rhythm and normal heart sounds.  No murmur heard. Pulmonary/Chest: Effort normal and breath sounds normal.  Abdominal: Soft. Bowel sounds are normal. She exhibits no distension. There is no tenderness.  Musculoskeletal: She exhibits no edema.  Neurological: She is  alert and oriented to person, place, and time.  Fluent speech  Skin: Skin is warm and dry.  Psychiatric: She has a normal mood and affect. Judgment normal.  Nursing note and vitals reviewed.    ED Treatments / Results  Labs (all labs ordered are listed, but only abnormal results are displayed) Labs Reviewed  URINALYSIS, ROUTINE W REFLEX MICROSCOPIC - Abnormal; Notable for the following components:      Result Value   Glucose, UA >=500 (*)    Ketones, ur 20 (*)    Bacteria, UA RARE (*)    Squamous Epithelial / LPF 6-30 (*)    All other components within normal limits  BASIC METABOLIC PANEL -  Abnormal; Notable for the following components:   Glucose, Bld 257 (*)    All other components within normal limits  CBG MONITORING, ED - Abnormal; Notable for the following components:   Glucose-Capillary 255 (*)    All other components within normal limits  CBG MONITORING, ED  POC URINE PREG, ED    EKG  EKG Interpretation None       Radiology No results found.  Procedures Procedures (including critical care time)  Medications Ordered in ED Medications  sodium chloride 0.9 % bolus 1,000 mL (0 mLs Intravenous Stopped 06/22/17 1210)     Initial Impression / Assessment and Plan / ED Course  I have reviewed the triage vital signs and the nursing notes.  Pertinent labs that were available during my care of the patient were reviewed by me and considered in my medical decision making (see chart for details).     PT well appearing on exam w/ normal VS. blood glucose 255, UA does show ketones therefore obtained BMP.  UPT negative.  Lab work shows hyperglycemia without evidence of DKA, overall reassuring.  Spent time counseling the patient on diabetic diet and the importance of medication compliance.  Provided her with short refill of metformin until she can get into her PCP.  Discussed supportive measures.  Extensively reviewed return precautions.  Final Clinical Impressions(s) / ED Diagnoses   Final diagnoses:  Hyperglycemia    ED Discharge Orders        Ordered    metFORMIN (GLUCOPHAGE) 500 MG tablet  2 times daily with meals     06/22/17 1124       Little, Wenda Overland, MD 06/22/17 1750

## 2017-06-22 NOTE — ED Triage Notes (Signed)
Pt states last night her blood sugar was 500  Pt is c/o dry mouth  States she ran out of her metformin about a month ago

## 2017-06-22 NOTE — ED Notes (Signed)
Bed: WA22 Expected date:  Expected time:  Means of arrival:  Comments: 

## 2017-08-08 ENCOUNTER — Encounter (HOSPITAL_COMMUNITY): Payer: Self-pay | Admitting: Emergency Medicine

## 2017-08-08 ENCOUNTER — Emergency Department (HOSPITAL_COMMUNITY)
Admission: EM | Admit: 2017-08-08 | Discharge: 2017-08-08 | Disposition: A | Discharge status: Home / Self Care | Payer: 59 | Attending: Emergency Medicine | Admitting: Emergency Medicine

## 2017-08-08 DIAGNOSIS — Y929 Unspecified place or not applicable: Secondary | ICD-10-CM | POA: Diagnosis not present

## 2017-08-08 DIAGNOSIS — X503XXA Overexertion from repetitive movements, initial encounter: Secondary | ICD-10-CM | POA: Insufficient documentation

## 2017-08-08 DIAGNOSIS — E119 Type 2 diabetes mellitus without complications: Secondary | ICD-10-CM | POA: Insufficient documentation

## 2017-08-08 DIAGNOSIS — J45909 Unspecified asthma, uncomplicated: Secondary | ICD-10-CM | POA: Diagnosis not present

## 2017-08-08 DIAGNOSIS — Z7984 Long term (current) use of oral hypoglycemic drugs: Secondary | ICD-10-CM | POA: Diagnosis not present

## 2017-08-08 DIAGNOSIS — T148XXA Other injury of unspecified body region, initial encounter: Secondary | ICD-10-CM | POA: Insufficient documentation

## 2017-08-08 DIAGNOSIS — Y990 Civilian activity done for income or pay: Secondary | ICD-10-CM | POA: Diagnosis not present

## 2017-08-08 DIAGNOSIS — Y9389 Activity, other specified: Secondary | ICD-10-CM | POA: Diagnosis not present

## 2017-08-08 LAB — CBC
HEMATOCRIT: 38.5 % (ref 36.0–46.0)
Hemoglobin: 12.3 g/dL (ref 12.0–15.0)
MCH: 24.4 pg — ABNORMAL LOW (ref 26.0–34.0)
MCHC: 31.9 g/dL (ref 30.0–36.0)
MCV: 76.4 fL — AB (ref 78.0–100.0)
Platelets: 292 10*3/uL (ref 150–400)
RBC: 5.04 MIL/uL (ref 3.87–5.11)
RDW: 13.7 % (ref 11.5–15.5)
WBC: 6.1 10*3/uL (ref 4.0–10.5)

## 2017-08-08 LAB — I-STAT BETA HCG BLOOD, ED (MC, WL, AP ONLY)

## 2017-08-08 LAB — COMPREHENSIVE METABOLIC PANEL
ALBUMIN: 3.6 g/dL (ref 3.5–5.0)
ALK PHOS: 65 U/L (ref 38–126)
ALT: 27 U/L (ref 14–54)
AST: 20 U/L (ref 15–41)
Anion gap: 7 (ref 5–15)
BILIRUBIN TOTAL: 0.4 mg/dL (ref 0.3–1.2)
BUN: 9 mg/dL (ref 6–20)
CO2: 28 mmol/L (ref 22–32)
CREATININE: 0.82 mg/dL (ref 0.44–1.00)
Calcium: 9.3 mg/dL (ref 8.9–10.3)
Chloride: 105 mmol/L (ref 101–111)
GFR calc Af Amer: >60 mL/min (ref >60)
GFR calc non Af Amer: >60 mL/min (ref >60)
GLUCOSE: 305 mg/dL — AB (ref 65–99)
POTASSIUM: 4.2 mmol/L (ref 3.5–5.1)
Sodium: 140 mmol/L (ref 135–145)
Total Protein: 7.4 g/dL (ref 6.5–8.1)

## 2017-08-08 LAB — URINALYSIS, ROUTINE W REFLEX MICROSCOPIC
BACTERIA UA: NONE SEEN
Bilirubin Urine: NEGATIVE
Glucose, UA: >=500 mg/dL — AB
Ketones, ur: NEGATIVE mg/dL
Leukocytes, UA: NEGATIVE
NITRITE: NEGATIVE
Protein, ur: NEGATIVE mg/dL
RBC / HPF: NONE SEEN RBC/hpf (ref 0–5)
SPECIFIC GRAVITY, URINE: 1.023 (ref 1.005–1.030)
pH: 6 (ref 5.0–8.0)

## 2017-08-08 LAB — LIPASE, BLOOD: Lipase: 42 U/L (ref 11–51)

## 2017-08-08 MED ORDER — METHOCARBAMOL 750 MG PO TABS: 750.0000 mg | ORAL_TABLET | Freq: Four times a day (QID) | ORAL | 0 refills | Status: DC

## 2017-08-08 MED ORDER — IBUPROFEN 400 MG PO TABS: 400.0000 mg | ORAL_TABLET | Freq: Four times a day (QID) | ORAL | 0 refills | Status: DC | PRN

## 2017-08-08 NOTE — ED Provider Notes (Signed)
[facility] DEPT Provider Note   CSN: [id number] Arrival date & time: 08/08/17  1457     History   Chief Complaint Chief Complaint  Patient presents with  . Back Pain  . Abdominal Pain    HPI [name] is a 32 y.o. female.  32 year old female presents with muscle skeletal back pain times several days.  Pain is dull and localized to her bilateral flanks and worse with any movement.  Patient does work as Pharmacologist.  Pain does radiate sometimes to her anterior thighs but denies any bowel or bladder dysfunction.  No foot drop noted.  No fever or chills.  No urinary symptoms.  Pain better with rest.  No treatment used prior to arrival.     Past Medical History:  Diagnosis Date  . Asthma   . Diabetes mellitus without complication ([place])   . Obesity     There are no active problems to display for this patient.   Past Surgical History:  Procedure Laterality Date  . CESAREAN SECTION    . TUBAL LIGATION       OB History   None      Home Medications    Prior to Admission medications   Medication Sig Start Date End Date Taking? Authorizing Provider  metFORMIN (GLUCOPHAGE) 500 MG tablet Take 1 tablet (500 mg total) by mouth 2 (two) times daily with a meal. 06/22/17  Yes Little, Wenda Overland, MD  Blood Glucose Monitoring Suppl (TRUE METRIX METER) w/Device KIT 1 each by Does not apply route 4 (four) times daily -  before meals and at bedtime. Patient not taking: Reported on 06/22/2017 12/09/15   Tresa Garter, MD  cetirizine (ZYRTEC) 5 MG tablet Take 1 tablet (5 mg total) by mouth daily. Patient not taking: Reported on 06/22/2017 06/28/16   Pisciotta, Elmyra Ricks, PA-C  glucose blood (TRUE METRIX BLOOD GLUCOSE TEST) test strip Use as instructed Patient not taking: Reported on 06/22/2017 12/09/15   Tresa Garter, MD  TRUEPLUS LANCETS 28G MISC 1 each by Does not apply route 4 (four) times daily - after meals and at  bedtime. Patient not taking: Reported on 06/22/2017 12/09/15   Tresa Garter, MD    Family History Family History  Problem Relation Age of Onset  . Diabetes Maternal Aunt     Social History Social History   Tobacco Use  . Smoking status: Never Smoker  . Smokeless tobacco: Never Used  Substance Use Topics  . Alcohol use: No  . Drug use: No     Allergies   Cefdinir and Other   Review of Systems Review of Systems  All other systems reviewed and are negative.    Physical Exam Updated Vital Signs BP 129/85 (BP Location: Right Arm)   Pulse 62   Temp 98.2 F (36.8 C) (Oral)   Resp 18   Ht 1.575 m (_0 )   Wt 83 kg (183 lb)   LMP 07/30/2017   SpO2 100%   BMI 33.47 kg/m   Physical Exam  Constitutional: She is oriented to person, place, and time. She appears well-developed and well-nourished.  Non-toxic appearance. No distress.  HENT:  Head: Normocephalic and atraumatic.  Eyes: Pupils are equal, round, and reactive to light. Conjunctivae, EOM and lids are normal.  Neck: Normal range of motion. Neck supple. No tracheal deviation present. No thyroid mass present.  Cardiovascular: Normal rate, regular rhythm and normal heart sounds. Exam reveals no gallop.  No  murmur heard. Pulmonary/Chest: Effort normal and breath sounds normal. No stridor. No respiratory distress. She has no decreased breath sounds. She has no wheezes. She has no rhonchi. She has no rales.  Abdominal: Soft. Normal appearance and bowel sounds are normal. She exhibits no distension. There is no tenderness. There is no rebound and no CVA tenderness.  Musculoskeletal: Normal range of motion. She exhibits no edema or tenderness.       Back:  Neurological: She is alert and oriented to person, place, and time. She has normal strength. No cranial nerve deficit or sensory deficit. GCS eye subscore is 4. GCS verbal subscore is 5. GCS motor subscore is 6.  Skin: Skin is warm and dry. No abrasion and no rash  noted.  Psychiatric: She has a normal mood and affect. Her speech is normal and behavior is normal.  Nursing note and vitals reviewed.    ED Treatments / Results  Labs (all labs ordered are listed, but only abnormal results are displayed) Labs Reviewed  COMPREHENSIVE METABOLIC PANEL - Abnormal; Notable for the following components:      Result Value   Glucose, Bld 305 (*)    All other components within normal limits  CBC - Abnormal; Notable for the following components:   MCV 76.4 (*)    MCH 24.4 (*)    All other components within normal limits  URINALYSIS, ROUTINE W REFLEX MICROSCOPIC - Abnormal; Notable for the following components:   Glucose, UA >=500 (*)    Hgb urine dipstick SMALL (*)    Squamous Epithelial / LPF 0-5 (*)    All other components within normal limits  LIPASE, BLOOD  I-STAT BETA HCG BLOOD, ED ([facility], [facility], [facility] ONLY)    EKG None  Radiology No results found.  Procedures Procedures (including critical care time)  Medications Ordered in ED Medications - No data to display   Initial Impression / Assessment and Plan / ED Course  I have reviewed the triage vital signs and the nursing notes.  Pertinent labs & imaging results that were available during my care of the patient were reviewed by me and considered in my medical decision making (see chart for details).     Patient with likely musculoskeletal back pain.  No red flags for cauda equina.  Will place on muscle relaxants and return precautions given  Final Clinical Impressions(s) / ED Diagnoses   Final diagnoses:  None    ED Discharge Orders    None       Lacretia Leigh, MD 08/08/17 2114

## 2017-08-08 NOTE — ED Triage Notes (Signed)
Pt reports lower back pain that radiates to bilat abd for week. Reports that she also has leg pains. Recently had flu and PNA took last dose antibiotics this AM.  Denies any n/v/d ir urinary problems.

## 2017-08-16 ENCOUNTER — Emergency Department (HOSPITAL_COMMUNITY): Payer: 59

## 2017-08-16 ENCOUNTER — Emergency Department (HOSPITAL_COMMUNITY)
Admission: EM | Admit: 2017-08-16 | Discharge: 2017-08-16 | Disposition: A | Discharge status: Home / Self Care | Payer: 59 | Attending: Emergency Medicine | Admitting: Emergency Medicine

## 2017-08-16 DIAGNOSIS — J4520 Mild intermittent asthma, uncomplicated: Secondary | ICD-10-CM | POA: Diagnosis not present

## 2017-08-16 DIAGNOSIS — Z79899 Other long term (current) drug therapy: Secondary | ICD-10-CM | POA: Diagnosis not present

## 2017-08-16 DIAGNOSIS — E119 Type 2 diabetes mellitus without complications: Secondary | ICD-10-CM | POA: Diagnosis not present

## 2017-08-16 DIAGNOSIS — Z7984 Long term (current) use of oral hypoglycemic drugs: Secondary | ICD-10-CM | POA: Insufficient documentation

## 2017-08-16 MED ORDER — PREDNISONE 20 MG PO TABS
60.0000 mg | ORAL_TABLET | Freq: Once | ORAL | Status: AC
  Administered 2017-08-16: 60 mg via ORAL
  Filled 2017-08-16: qty 3

## 2017-08-16 MED ORDER — IPRATROPIUM-ALBUTEROL 0.5-2.5 (3) MG/3ML IN SOLN
3.0000 mL | Freq: Once | RESPIRATORY_TRACT | Status: AC
  Administered 2017-08-16: 3 mL via RESPIRATORY_TRACT
  Filled 2017-08-16: qty 3

## 2017-08-16 MED ORDER — ALBUTEROL SULFATE HFA 108 (90 BASE) MCG/ACT IN AERS: 1.0000 | INHALATION_SPRAY | Freq: Four times a day (QID) | RESPIRATORY_TRACT | 1 refills | Status: AC | PRN

## 2017-08-16 MED ORDER — ALBUTEROL SULFATE (5 MG/ML) 0.5% IN NEBU: 2.5000 mg | INHALATION_SOLUTION | Freq: Four times a day (QID) | RESPIRATORY_TRACT | 12 refills | Status: AC | PRN

## 2017-08-16 NOTE — Discharge Instructions (Addendum)
You have been treated for an asthma attack in the ED.     Warm steam from a hot shower will help your symptoms.   Follow up with your primary care provider for further discussion of today's ER visit.   If you develop any new or worsening symptoms, including but not limited to fever, persistent vomiting, worsening shortness of breath or other symptoms that concern you, please return to the Emergency Department immediately.

## 2017-08-16 NOTE — ED Provider Notes (Signed)
[facility] DEPT Provider Note   CSN: [id number] Arrival date & time: 08/16/17  0340     History   Chief Complaint No chief complaint on file.   HPI [name] is a 32 y.o. female.  HPI 32 year old African-American female past medical history significant for diabetes and asthma presents to the ED with complaints of asthma exacerbation.  Patient states that since the pollen has been out her allergies have been causing her asthma to flareup.  Patient reports wheezing, chest tightness.  States is been ongoing for the past week.  States that she took her asthma pill last week that she thinks made her symptoms worse.  Patient has been using her inhaler and nebulizer at home with only little relief.  She denies any associated fevers.  Does report some cough and rhinorrhea.  Denies any sputum production.  Denies any lightheadedness or dizziness.  Patient denies any chest pain or shortness of breath.  Nothing makes her symptoms better.  Pt denies any fever, chill, ha, vision changes, lightheadedness, dizziness, neck pain, abd pain, n/v/d, urinary symptoms, change in bowel habits, melena, hematochezia, lower extremity paresthesias.  Past Medical History:  Diagnosis Date  . Asthma   . Diabetes mellitus without complication ([place])   . Obesity     There are no active problems to display for this patient.   Past Surgical History:  Procedure Laterality Date  . CESAREAN SECTION    . TUBAL LIGATION       OB History   None      Home Medications    Prior to Admission medications   Medication Sig Start Date End Date Taking? Authorizing Provider  metFORMIN (GLUCOPHAGE) 500 MG tablet Take 1 tablet (500 mg total) by mouth 2 (two) times daily with a meal. 06/22/17  Yes Little, Wenda Overland, MD  albuterol (PROVENTIL HFA;VENTOLIN HFA) 108 (90 Base) MCG/ACT inhaler Inhale 1-2 puffs into the lungs every 6 (six) hours as needed for wheezing or shortness of  breath. 08/16/17   Doristine Devoid, PA-C  albuterol (PROVENTIL) (5 MG/ML) 0.5% nebulizer solution Take 0.5 mLs (2.5 mg total) by nebulization every 6 (six) hours as needed for wheezing or shortness of breath. 08/16/17   Doristine Devoid, PA-C  Blood Glucose Monitoring Suppl (TRUE METRIX METER) w/Device KIT 1 each by Does not apply route 4 (four) times daily -  before meals and at bedtime. Patient not taking: Reported on 06/22/2017 12/09/15   Tresa Garter, MD  cetirizine (ZYRTEC) 5 MG tablet Take 1 tablet (5 mg total) by mouth daily. Patient not taking: Reported on 06/22/2017 06/28/16   Pisciotta, Elmyra Ricks, PA-C  glucose blood (TRUE METRIX BLOOD GLUCOSE TEST) test strip Use as instructed Patient not taking: Reported on 06/22/2017 12/09/15   Tresa Garter, MD  ibuprofen (ADVIL,MOTRIN) 400 MG tablet Take 1 tablet (400 mg total) by mouth every 6 (six) hours as needed. Patient not taking: Reported on 08/16/2017 08/08/17   Lacretia Leigh, MD  methocarbamol (ROBAXIN-750) 750 MG tablet Take 1 tablet (750 mg total) by mouth 4 (four) times daily. Patient not taking: Reported on 08/16/2017 08/08/17   Lacretia Leigh, MD  TRUEPLUS LANCETS 28G MISC 1 each by Does not apply route 4 (four) times daily - after meals and at bedtime. Patient not taking: Reported on 06/22/2017 12/09/15   Tresa Garter, MD    Family History Family History  Problem Relation Age of Onset  . Diabetes Maternal Aunt  Social History Social History   Tobacco Use  . Smoking status: Never Smoker  . Smokeless tobacco: Never Used  Substance Use Topics  . Alcohol use: No  . Drug use: No     Allergies   Cefdinir and Other   Review of Systems Review of Systems  All other systems reviewed and are negative.    Physical Exam Updated Vital Signs BP 133/83 (BP Location: Left Arm)   Pulse 65   Temp 98.4 F (36.9 C) (Oral)   Resp 16   LMP 07/30/2017   SpO2 100%   Physical Exam  Constitutional: She is oriented  to person, place, and time. She appears well-developed and well-nourished.  Non-toxic appearance. No distress.  HENT:  Head: Normocephalic and atraumatic.  Nose: Nose normal.  Mouth/Throat: Oropharynx is clear and moist.  Eyes: Pupils are equal, round, and reactive to light. Conjunctivae are normal. Right eye exhibits no discharge. Left eye exhibits no discharge.  Neck: Normal range of motion. Neck supple.  Cardiovascular: Normal rate, regular rhythm, normal heart sounds and intact distal pulses. Exam reveals no gallop and no friction rub.  No murmur heard. Pulmonary/Chest: Effort normal. No stridor. No respiratory distress. She has decreased breath sounds. She has no wheezes. She has no rhonchi. She has no rales. She exhibits no tenderness.  Musculoskeletal: Normal range of motion. She exhibits no tenderness.  No lower extremity edema or calf tenderness.  Lymphadenopathy:    She has no cervical adenopathy.  Neurological: She is alert and oriented to person, place, and time.  Skin: Skin is warm and dry. Capillary refill takes less than 2 seconds.  Psychiatric: Her behavior is normal. Judgment and thought content normal.  Nursing note and vitals reviewed.    ED Treatments / Results  Labs (all labs ordered are listed, but only abnormal results are displayed) Labs Reviewed - No data to display  EKG None  Radiology Dg Chest 2 View  Result Date: 08/16/2017 CLINICAL DATA:  Asthma.  Cough and congestion.  Shortness of breath. EXAM: CHEST - 2 VIEW COMPARISON:  06/28/2016 FINDINGS: The cardiomediastinal contours are normal. No hyperinflation. The lungs are clear. Pulmonary vasculature is normal. No consolidation, pleural effusion, or pneumothorax. No acute osseous abnormalities are seen. IMPRESSION: Unremarkable radiographs of the chest. Electronically Signed   By: Jeb Levering M.D.   On: 08/16/2017 06:47    Procedures Procedures (including critical care time)  Medications Ordered in  ED Medications  ipratropium-albuterol (DUONEB) 0.5-2.5 (3) MG/3ML nebulizer solution 3 mL (3 mLs Nebulization Given 08/16/17 0540)  predniSONE (DELTASONE) tablet 60 mg (60 mg Oral Given 08/16/17 0540)     Initial Impression / Assessment and Plan / ED Course  I have reviewed the triage vital signs and the nursing notes.  Pertinent labs & imaging results that were available during my care of the patient were reviewed by me and considered in my medical decision making (see chart for details).     Patient ambulated in ED with O2 saturations maintained >90, no current signs of respiratory distress.  X-ray shows no acute abnormalities as reviewed by myself.  Lung exam improved after nebulizer treatment. Prednisone given in the ED. patient with history of diabetes that is uncontrolled.  We will not give burst of steroids.  Pt states they are breathing at baseline. Pt has been instructed to continue using prescribed medications and to speak with PCP about today's exacerbation. .  Pt is hemodynamically stable, in NAD, & able to ambulate in the  ED. Evaluation does not show pathology that would require ongoing emergent intervention or inpatient treatment. I explained the diagnosis to the patient. Pain has been managed & has no complaints prior to dc. Pt is comfortable with above plan and is stable for discharge at this time. All questions were answered prior to disposition. Strict return precautions for f/u to the ED were discussed. Encouraged follow up with PCP.    Final Clinical Impressions(s) / ED Diagnoses   Final diagnoses:  Mild intermittent asthma, unspecified whether complicated    ED Discharge Orders        Ordered    albuterol (PROVENTIL HFA;VENTOLIN HFA) 108 (90 Base) MCG/ACT inhaler  Every 6 hours PRN     08/16/17 0703    albuterol (PROVENTIL) (5 MG/ML) 0.5% nebulizer solution  Every 6 hours PRN     08/16/17 0703       Doristine Devoid, PA-C 08/16/17 5075    Duffy Bruce,  MD 08/16/17 1405

## 2017-08-16 NOTE — ED Notes (Signed)
Pt ambulated with 02 sat above 95

## 2017-10-06 ENCOUNTER — Emergency Department (HOSPITAL_COMMUNITY)
Admission: EM | Admit: 2017-10-06 | Discharge: 2017-10-06 | Disposition: A | Discharge status: Home / Self Care | Payer: 59 | Attending: Emergency Medicine | Admitting: Emergency Medicine

## 2017-10-06 ENCOUNTER — Other Ambulatory Visit: Payer: Self-pay

## 2017-10-06 ENCOUNTER — Encounter (HOSPITAL_COMMUNITY): Payer: Self-pay | Admitting: Emergency Medicine

## 2017-10-06 DIAGNOSIS — J45909 Unspecified asthma, uncomplicated: Secondary | ICD-10-CM | POA: Insufficient documentation

## 2017-10-06 DIAGNOSIS — Z7984 Long term (current) use of oral hypoglycemic drugs: Secondary | ICD-10-CM | POA: Insufficient documentation

## 2017-10-06 DIAGNOSIS — E1165 Type 2 diabetes mellitus with hyperglycemia: Secondary | ICD-10-CM | POA: Insufficient documentation

## 2017-10-06 DIAGNOSIS — R739 Hyperglycemia, unspecified: Secondary | ICD-10-CM

## 2017-10-06 LAB — I-STAT CHEM 8, ED
BUN: 12 mg/dL (ref 6–20)
Calcium, Ion: 1.23 mmol/L (ref 1.15–1.40)
Chloride: 102 mmol/L (ref 101–111)
Creatinine, Ser: 0.7 mg/dL (ref 0.44–1.00)
Glucose, Bld: 256 mg/dL — ABNORMAL HIGH (ref 65–99)
HCT: 36 % (ref 36.0–46.0)
Hemoglobin: 12.2 g/dL (ref 12.0–15.0)
Potassium: 3.7 mmol/L (ref 3.5–5.1)
Sodium: 140 mmol/L (ref 135–145)
TCO2: 26 mmol/L (ref 22–32)

## 2017-10-06 LAB — URINALYSIS, ROUTINE W REFLEX MICROSCOPIC
Bilirubin Urine: NEGATIVE
Glucose, UA: >=500 mg/dL — AB
Hgb urine dipstick: NEGATIVE
Ketones, ur: NEGATIVE mg/dL
Leukocytes, UA: NEGATIVE
Nitrite: NEGATIVE
Protein, ur: NEGATIVE mg/dL
Specific Gravity, Urine: 1.02 (ref 1.005–1.030)
pH: 5 (ref 5.0–8.0)

## 2017-10-06 LAB — CBG MONITORING, ED: Glucose-Capillary: 312 mg/dL — ABNORMAL HIGH (ref 65–99)

## 2017-10-06 MED ORDER — METFORMIN HCL 500 MG PO TABS: 500.0000 mg | ORAL_TABLET | Freq: Two times a day (BID) | ORAL | 0 refills | Status: DC

## 2017-10-06 NOTE — ED Triage Notes (Signed)
Patient has ran out of metformin for about two weeks. Patient needs a medication refill. BS was checked [id number]. Patient takes 500 mg twice a day.

## 2017-10-06 NOTE — Discharge Instructions (Signed)
Your lab work today was reassuring although your blood sugar was high.  We will refill your metformin.  Call [facility] first thing Monday morning to set up a follow-up appointment for as soon as possible to reestablish care with them and for help with monitoring her blood sugars.  Return to the emergency department if any concerning signs or symptoms develop such as fevers, vomiting, confusion, or persistently high blood sugars.

## 2017-10-06 NOTE — ED Provider Notes (Signed)
[facility] DEPT Provider Note   CSN: [id number] Arrival date & time: 10/06/17  1853     History   Chief Complaint Chief Complaint  Patient presents with  . Hyperglycemia    HPI [name] is a 32 y.o. female with history of asthma, diabetes mellitus, and obesity presents for evaluation of acute onset, progressively worsening hyperglycemia.  She states that she ran out of her metformin 2 weeks ago and did not have any refills.  Since then she has noted polyuria and polydipsia.  Today when she took her blood sugar it was 400 which is elevated for her.  She denies fevers, chills, confusion, abdominal pain, nausea, vomiting, chest pain, or shortness of breath.  She has not tried anything for her symptoms.  She did have a primary care physician who was prescribing her metformin but she states that her health insurance has since "collapsed" and she has been unable to follow-up with this physician.  The history is provided by the patient.    Past Medical History:  Diagnosis Date  . Asthma   . Diabetes mellitus without complication ([place])   . Obesity     There are no active problems to display for this patient.   Past Surgical History:  Procedure Laterality Date  . CESAREAN SECTION    . TUBAL LIGATION       OB History   None      Home Medications    Prior to Admission medications   Medication Sig Start Date End Date Taking? Authorizing Provider  albuterol (PROVENTIL HFA;VENTOLIN HFA) 108 (90 Base) MCG/ACT inhaler Inhale 1-2 puffs into the lungs every 6 (six) hours as needed for wheezing or shortness of breath. 08/16/17   Doristine Devoid, PA-C  albuterol (PROVENTIL) (5 MG/ML) 0.5% nebulizer solution Take 0.5 mLs (2.5 mg total) by nebulization every 6 (six) hours as needed for wheezing or shortness of breath. 08/16/17   Doristine Devoid, PA-C  Blood Glucose Monitoring Suppl (TRUE METRIX METER) w/Device KIT 1 each by Does not apply route 4  (four) times daily -  before meals and at bedtime. Patient not taking: Reported on 06/22/2017 12/09/15   Tresa Garter, MD  cetirizine (ZYRTEC) 5 MG tablet Take 1 tablet (5 mg total) by mouth daily. Patient not taking: Reported on 06/22/2017 06/28/16   Pisciotta, Elmyra Ricks, PA-C  glucose blood (TRUE METRIX BLOOD GLUCOSE TEST) test strip Use as instructed Patient not taking: Reported on 06/22/2017 12/09/15   Tresa Garter, MD  ibuprofen (ADVIL,MOTRIN) 400 MG tablet Take 1 tablet (400 mg total) by mouth every 6 (six) hours as needed. Patient not taking: Reported on 08/16/2017 08/08/17   Lacretia Leigh, MD  metFORMIN (GLUCOPHAGE) 500 MG tablet Take 1 tablet (500 mg total) by mouth 2 (two) times daily with a meal. 10/06/17 11/05/17  Rodell Perna A, PA-C  methocarbamol (ROBAXIN-750) 750 MG tablet Take 1 tablet (750 mg total) by mouth 4 (four) times daily. Patient not taking: Reported on 08/16/2017 08/08/17   Lacretia Leigh, MD  TRUEPLUS LANCETS 28G MISC 1 each by Does not apply route 4 (four) times daily - after meals and at bedtime. Patient not taking: Reported on 06/22/2017 12/09/15   Tresa Garter, MD    Family History Family History  Problem Relation Age of Onset  . Diabetes Maternal Aunt     Social History Social History   Tobacco Use  . Smoking status: Never Smoker  . Smokeless tobacco: Never Used  Substance Use Topics  . Alcohol use: No  . Drug use: No     Allergies   Cefdinir and Other   Review of Systems Review of Systems  Constitutional: Negative for chills and fever.  Endocrine: Positive for polydipsia and polyuria.  Genitourinary: Positive for frequency. Negative for dysuria and hematuria.  All other systems reviewed and are negative.    Physical Exam Updated Vital Signs BP 122/77 (BP Location: Right Arm)   Pulse 78   Temp 97.8 F (36.6 C) (Oral)   Resp 16   Ht _0  (1.575 m)   Wt 82.6 kg (182 lb)   SpO2 100%   BMI 33.29 kg/m   Physical Exam    Constitutional: She appears well-developed and well-nourished. No distress.  Resting comfortably in chair  HENT:  Head: Normocephalic and atraumatic.  Eyes: Conjunctivae are normal. Right eye exhibits no discharge. Left eye exhibits no discharge.  Neck: No JVD present. No tracheal deviation present.  Cardiovascular: Normal rate and regular rhythm.  Pulmonary/Chest: Effort normal and breath sounds normal.  Abdominal: Soft. Bowel sounds are normal. She exhibits no distension. There is no guarding.  Musculoskeletal: She exhibits no edema.  Neurological: She is alert.  Skin: Skin is warm and dry. No erythema.  Psychiatric: She has a normal mood and affect. Her behavior is normal.  Nursing note and vitals reviewed.    ED Treatments / Results  Labs (all labs ordered are listed, but only abnormal results are displayed) Labs Reviewed  URINALYSIS, ROUTINE W REFLEX MICROSCOPIC - Abnormal; Notable for the following components:      Result Value   Glucose, UA >=500 (*)    Bacteria, UA RARE (*)    All other components within normal limits  CBG MONITORING, ED - Abnormal; Notable for the following components:   Glucose-Capillary 312 (*)    All other components within normal limits  I-STAT CHEM 8, ED - Abnormal; Notable for the following components:   Glucose, Bld 256 (*)    All other components within normal limits    EKG None  Radiology No results found.  Procedures Procedures (including critical care time)  Medications Ordered in ED Medications - No data to display   Initial Impression / Assessment and Plan / ED Course  I have reviewed the triage vital signs and the nursing notes.  Pertinent labs & imaging results that were available during my care of the patient were reviewed by me and considered in my medical decision making (see chart for details).     Patient presents for evaluation of hyperglycemia.  She is afebrile, vital signs are stable.  She is nontoxic in  appearance.  She has been out of her metformin for 2 weeks.  She has been unable to follow-up with her primary care physician as she no longer has insurance but she has been a patient at [facility] in the past 2 years.  I encouraged the patient to follow-up with them for reevaluation and establishment of primary care services through them.  Calculated anion gap is 12 and there is no ketonuria although she does have gross glucosuria consistent with her diabetes.  By definition she is not in DKA.  She is extremely well-appearing.  We will give her a one-month supply of metformin and she will follow-up with her PCP through [facility] for reevaluation.  Discussed strict ED return precautions. Pt verbalized understanding of and agreement with plan and is safe for discharge home  at this time.   Final Clinical Impressions(s) / ED Diagnoses   Final diagnoses:  Hyperglycemia    ED Discharge Orders        Ordered    metFORMIN (GLUCOPHAGE) 500 MG tablet  2 times daily with meals     10/06/17 2038       Renita Papa, PA-C 10/06/17 2048    Lacretia Leigh, MD 10/07/17 2248

## 2017-11-16 ENCOUNTER — Emergency Department (HOSPITAL_COMMUNITY)
Admission: EM | Admit: 2017-11-16 | Discharge: 2017-11-17 | Disposition: A | Discharge status: Home / Self Care | Payer: Self-pay | Attending: Emergency Medicine | Admitting: Emergency Medicine

## 2017-11-16 ENCOUNTER — Encounter (HOSPITAL_COMMUNITY): Payer: Self-pay | Admitting: *Deleted

## 2017-11-16 DIAGNOSIS — Z7984 Long term (current) use of oral hypoglycemic drugs: Secondary | ICD-10-CM | POA: Insufficient documentation

## 2017-11-16 DIAGNOSIS — R030 Elevated blood-pressure reading, without diagnosis of hypertension: Secondary | ICD-10-CM | POA: Insufficient documentation

## 2017-11-16 DIAGNOSIS — E1165 Type 2 diabetes mellitus with hyperglycemia: Secondary | ICD-10-CM | POA: Insufficient documentation

## 2017-11-16 DIAGNOSIS — F458 Other somatoform disorders: Secondary | ICD-10-CM | POA: Insufficient documentation

## 2017-11-16 DIAGNOSIS — R739 Hyperglycemia, unspecified: Secondary | ICD-10-CM

## 2017-11-16 LAB — CBG MONITORING, ED: GLUCOSE-CAPILLARY: 264 mg/dL — AB (ref 70–99)

## 2017-11-16 NOTE — ED Triage Notes (Signed)
Pt c/o feeling jittery, shaky, sts her tongue feels numb as well as toes and her fingers. Pt reports she is diabetic and takes metformin. Denies any new medications, however does sts she had coffee today, and that was her first time having caffeine  drink after more than five years.

## 2017-11-17 ENCOUNTER — Other Ambulatory Visit: Payer: Self-pay

## 2017-11-17 LAB — CBC WITH DIFFERENTIAL/PLATELET
BASOS PCT: 0 %
Basophils Absolute: 0 10*3/uL (ref 0.0–0.1)
Eosinophils Absolute: 0.2 10*3/uL (ref 0.0–0.7)
Eosinophils Relative: 2 %
HEMATOCRIT: 37.1 % (ref 36.0–46.0)
Hemoglobin: 12.3 g/dL (ref 12.0–15.0)
Lymphocytes Relative: 24 %
Lymphs Abs: 1.7 10*3/uL (ref 0.7–4.0)
MCH: 25.1 pg — ABNORMAL LOW (ref 26.0–34.0)
MCHC: 33.2 g/dL (ref 30.0–36.0)
MCV: 75.7 fL — ABNORMAL LOW (ref 78.0–100.0)
MONO ABS: 0.5 10*3/uL (ref 0.1–1.0)
MONOS PCT: 7 %
NEUTROS PCT: 67 %
Neutro Abs: 4.6 10*3/uL (ref 1.7–7.7)
PLATELETS: 186 10*3/uL (ref 150–400)
RBC: 4.9 MIL/uL (ref 3.87–5.11)
RDW: 13.7 % (ref 11.5–15.5)
WBC: 7 10*3/uL (ref 4.0–10.5)

## 2017-11-17 LAB — COMPREHENSIVE METABOLIC PANEL
ALT: 17 U/L (ref 0–44)
AST: 14 U/L — AB (ref 15–41)
Albumin: 3.6 g/dL (ref 3.5–5.0)
Alkaline Phosphatase: 64 U/L (ref 38–126)
Anion gap: 8 (ref 5–15)
BILIRUBIN TOTAL: 0.2 mg/dL — AB (ref 0.3–1.2)
BUN: 15 mg/dL (ref 6–20)
CALCIUM: 9.4 mg/dL (ref 8.9–10.3)
CO2: 25 mmol/L (ref 22–32)
CREATININE: 0.88 mg/dL (ref 0.44–1.00)
Chloride: 104 mmol/L (ref 98–111)
GFR calc non Af Amer: >60 mL/min (ref >60)
Glucose, Bld: 326 mg/dL — ABNORMAL HIGH (ref 70–99)
Potassium: 4.3 mmol/L (ref 3.5–5.1)
Sodium: 137 mmol/L (ref 135–145)
TOTAL PROTEIN: 7.3 g/dL (ref 6.5–8.1)

## 2017-11-17 MED ORDER — LORAZEPAM 1 MG PO TABS
1.0000 mg | ORAL_TABLET | Freq: Once | ORAL | Status: AC
  Administered 2017-11-17: 1 mg via ORAL
  Filled 2017-11-17: qty 1

## 2017-11-17 MED ORDER — METFORMIN HCL 500 MG PO TABS: 500.0000 mg | ORAL_TABLET | Freq: Two times a day (BID) | ORAL | 0 refills | Status: DC

## 2017-11-17 NOTE — ED Provider Notes (Signed)
[facility] DEPT Provider Note   CSN: [id number] Arrival date & time: 11/16/17  2204     History   Chief Complaint Chief Complaint  Patient presents with  . numbness (tongue, toes, fingers)    HPI [name] is a 32 y.o. female.  The history is provided by the patient.  She has history of diabetes and asthma and comes in complaining of numbness in her hands and feet throughout the day today.  There is also numbness in her tongue.  She is complaining of feeling lightheaded.  Mouth is dry.  She has noted some cramps in her feet and her hands.  Nothing makes symptoms better, nothing makes them worse.  She has not had problems like this before.  Also, she has noted some intermittent rashes on her hands for the last month.  She describes papules which are even absent and not pruritic.  Of note, she has been noncompliant with her medications.  She states that she had not taken her metformin for the last 2 weeks because when she takes it, her glucose will drop down from 300 to 120.  She is worried that if she takes her metformin when her glucose is 120 that will get dangerously low.  Also, she is no longer able to see her PCP because she lost her job with the associated insurance.  Past Medical History:  Diagnosis Date  . Asthma   . Diabetes mellitus without complication ([place])   . Obesity     There are no active problems to display for this patient.   Past Surgical History:  Procedure Laterality Date  . CESAREAN SECTION    . TUBAL LIGATION       OB History   None      Home Medications    Prior to Admission medications   Medication Sig Start Date End Date Taking? Authorizing Provider  albuterol (PROVENTIL HFA;VENTOLIN HFA) 108 (90 Base) MCG/ACT inhaler Inhale 1-2 puffs into the lungs every 6 (six) hours as needed for wheezing or shortness of breath. 08/16/17   Doristine Devoid, PA-C  albuterol (PROVENTIL) (5 MG/ML) 0.5% nebulizer solution  Take 0.5 mLs (2.5 mg total) by nebulization every 6 (six) hours as needed for wheezing or shortness of breath. 08/16/17   Doristine Devoid, PA-C  Blood Glucose Monitoring Suppl (TRUE METRIX METER) w/Device KIT 1 each by Does not apply route 4 (four) times daily -  before meals and at bedtime. Patient not taking: Reported on 06/22/2017 12/09/15   Tresa Garter, MD  cetirizine (ZYRTEC) 5 MG tablet Take 1 tablet (5 mg total) by mouth daily. Patient not taking: Reported on 06/22/2017 06/28/16   Pisciotta, Elmyra Ricks, PA-C  glucose blood (TRUE METRIX BLOOD GLUCOSE TEST) test strip Use as instructed Patient not taking: Reported on 06/22/2017 12/09/15   Tresa Garter, MD  ibuprofen (ADVIL,MOTRIN) 400 MG tablet Take 1 tablet (400 mg total) by mouth every 6 (six) hours as needed. Patient not taking: Reported on 08/16/2017 08/08/17   Lacretia Leigh, MD  metFORMIN (GLUCOPHAGE) 500 MG tablet Take 1 tablet (500 mg total) by mouth 2 (two) times daily with a meal. 10/06/17 11/05/17  Rodell Perna A, PA-C  methocarbamol (ROBAXIN-750) 750 MG tablet Take 1 tablet (750 mg total) by mouth 4 (four) times daily. Patient not taking: Reported on 08/16/2017 08/08/17   Lacretia Leigh, MD  TRUEPLUS LANCETS 28G MISC 1 each by Does not apply route 4 (four) times daily - after meals  and at bedtime. Patient not taking: Reported on 06/22/2017 12/09/15   Tresa Garter, MD    Family History Family History  Problem Relation Age of Onset  . Diabetes Maternal Aunt     Social History Social History   Tobacco Use  . Smoking status: Never Smoker  . Smokeless tobacco: Never Used  Substance Use Topics  . Alcohol use: No  . Drug use: No     Allergies   Cefdinir and Other   Review of Systems Review of Systems  All other systems reviewed and are negative.    Physical Exam Updated Vital Signs BP (!) 155/83   Pulse 71   Temp 98.3 F (36.8 C) (Oral)   Resp 18   Ht _0  (1.575 m)   Wt 83.9 kg (185 lb)   LMP  10/31/2017 (Exact Date)   SpO2 99%   BMI 33.84 kg/m   Physical Exam  Nursing note and vitals reviewed.  32 year old female, resting comfortably and in no acute distress. Vital signs are significant for elevated systolic blood pressure. Oxygen saturation is 99%, which is normal. Head is normocephalic and atraumatic. PERRLA, EOMI. Oropharynx is clear. Neck is nontender and supple without adenopathy or JVD.  There are no carotid bruits. Back is nontender and there is no CVA tenderness. Lungs are clear without rales, wheezes, or rhonchi. Chest is nontender. Heart has regular rate and rhythm without murmur. Abdomen is soft, flat, nontender without masses or hepatosplenomegaly and peristalsis is normoactive. Extremities have no cyanosis or edema, full range of motion is present. Skin is warm and dry.  Faint papular rash is noted on the dorsum of both hands, nonspecific in appearance. Neurologic: Mental status is normal, cranial nerves are intact, there are no motor or sensory deficits.  ED Treatments / Results  Labs (all labs ordered are listed, but only abnormal results are displayed) Labs Reviewed  COMPREHENSIVE METABOLIC PANEL - Abnormal; Notable for the following components:      Result Value   Glucose, Bld 326 (*)    AST 14 (*)    Total Bilirubin 0.2 (*)    All other components within normal limits  CBC WITH DIFFERENTIAL/PLATELET - Abnormal; Notable for the following components:   MCV 75.7 (*)    MCH 25.1 (*)    All other components within normal limits  CBG MONITORING, ED - Abnormal; Notable for the following components:   Glucose-Capillary 264 (*)    All other components within normal limits   Procedures Procedures   Medications Ordered in ED Medications  LORazepam (ATIVAN) tablet 1 mg (1 mg Oral Given 11/17/17 0046)     Initial Impression / Assessment and Plan / ED Course  I have reviewed the triage vital signs and the nursing notes.  Pertinent lab results that were  available during my care of the patient were reviewed by me and considered in my medical decision making (see chart for details).  Numbness in hands and feet along with oral numbness.  Clinical picture is suggestive of hyperventilation, which she is not chronically hyperventilating.  Medication noncompliance.  Old records are reviewed, and she has several ED visits related to hyperglycemia.  I have explained to her that metformin is unlikely to cause problems with hypoglycemia.  Will check screening labs today and will give therapeutic trial of lorazepam.  No findings on neurologic exam to suggest stroke.  Laboratory work-up is unremarkable.  Mild hyperglycemia is present without evidence of ketoacidosis.  Following lorazepam,  symptoms have completely resolved.  She now does tell me that she was feeling short of breath through the day.  At this point, symptoms appear to be secondary to hyperventilation.  Treatment strategies for hyperventilation were discussed.  She is given Doctor, hospital to aid in finding new PCP until she gets insurance with a new job.  Prescription given for 1 month supply of her Metformin.  Final Clinical Impressions(s) / ED Diagnoses   Final diagnoses:  Hyperventilation syndrome  Hyperglycemia    ED Discharge Orders        Ordered    metFORMIN (GLUCOPHAGE) 500 MG tablet  2 times daily with meals     40/98/11 9147       Delora Fuel, MD 82/95/62 [phone]

## 2017-11-23 ENCOUNTER — Encounter (HOSPITAL_COMMUNITY): Payer: Self-pay | Admitting: Emergency Medicine

## 2017-11-23 ENCOUNTER — Emergency Department (HOSPITAL_COMMUNITY)
Admission: EM | Admit: 2017-11-23 | Discharge: 2017-11-23 | Disposition: A | Discharge status: Home / Self Care | Payer: Self-pay | Attending: Emergency Medicine | Admitting: Emergency Medicine

## 2017-11-23 ENCOUNTER — Other Ambulatory Visit: Payer: Self-pay

## 2017-11-23 ENCOUNTER — Emergency Department (HOSPITAL_COMMUNITY): Payer: Self-pay

## 2017-11-23 DIAGNOSIS — Z9114 Patient's other noncompliance with medication regimen: Secondary | ICD-10-CM | POA: Insufficient documentation

## 2017-11-23 DIAGNOSIS — R739 Hyperglycemia, unspecified: Secondary | ICD-10-CM

## 2017-11-23 DIAGNOSIS — J45909 Unspecified asthma, uncomplicated: Secondary | ICD-10-CM | POA: Insufficient documentation

## 2017-11-23 DIAGNOSIS — M25512 Pain in left shoulder: Secondary | ICD-10-CM | POA: Insufficient documentation

## 2017-11-23 DIAGNOSIS — M542 Cervicalgia: Secondary | ICD-10-CM | POA: Insufficient documentation

## 2017-11-23 DIAGNOSIS — R0789 Other chest pain: Secondary | ICD-10-CM | POA: Insufficient documentation

## 2017-11-23 DIAGNOSIS — Z7984 Long term (current) use of oral hypoglycemic drugs: Secondary | ICD-10-CM | POA: Insufficient documentation

## 2017-11-23 DIAGNOSIS — R202 Paresthesia of skin: Secondary | ICD-10-CM | POA: Insufficient documentation

## 2017-11-23 DIAGNOSIS — E1165 Type 2 diabetes mellitus with hyperglycemia: Secondary | ICD-10-CM | POA: Insufficient documentation

## 2017-11-23 DIAGNOSIS — R0602 Shortness of breath: Secondary | ICD-10-CM | POA: Insufficient documentation

## 2017-11-23 LAB — COMPREHENSIVE METABOLIC PANEL
ALBUMIN: 3.6 g/dL (ref 3.5–5.0)
ALK PHOS: 44 U/L (ref 38–126)
ALT: 16 U/L (ref 0–44)
ANION GAP: 9 (ref 5–15)
AST: 15 U/L (ref 15–41)
BUN: 8 mg/dL (ref 6–20)
CO2: 22 mmol/L (ref 22–32)
Calcium: 9 mg/dL (ref 8.9–10.3)
Chloride: 108 mmol/L (ref 98–111)
Creatinine, Ser: 0.77 mg/dL (ref 0.44–1.00)
GFR calc Af Amer: >60 mL/min (ref >60)
GFR calc non Af Amer: >60 mL/min (ref >60)
Glucose, Bld: 148 mg/dL — ABNORMAL HIGH (ref 70–99)
POTASSIUM: 3.6 mmol/L (ref 3.5–5.1)
SODIUM: 139 mmol/L (ref 135–145)
Total Bilirubin: 0.6 mg/dL (ref 0.3–1.2)
Total Protein: 6.7 g/dL (ref 6.5–8.1)

## 2017-11-23 LAB — CBC WITH DIFFERENTIAL/PLATELET
Abs Immature Granulocytes: 0 10*3/uL (ref 0.0–0.1)
BASOS ABS: 0 10*3/uL (ref 0.0–0.1)
Basophils Relative: 1 %
EOS PCT: 1 %
Eosinophils Absolute: 0.1 10*3/uL (ref 0.0–0.7)
HCT: 38.1 % (ref 36.0–46.0)
Hemoglobin: 11.9 g/dL — ABNORMAL LOW (ref 12.0–15.0)
Immature Granulocytes: 0 %
LYMPHS PCT: 26 %
Lymphs Abs: 1.5 10*3/uL (ref 0.7–4.0)
MCH: 24.8 pg — ABNORMAL LOW (ref 26.0–34.0)
MCHC: 31.2 g/dL (ref 30.0–36.0)
MCV: 79.5 fL (ref 78.0–100.0)
Monocytes Absolute: 0.4 10*3/uL (ref 0.1–1.0)
Monocytes Relative: 7 %
NEUTROS PCT: 65 %
Neutro Abs: 3.7 10*3/uL (ref 1.7–7.7)
Platelets: 230 10*3/uL (ref 150–400)
RBC: 4.79 MIL/uL (ref 3.87–5.11)
RDW: 13.9 % (ref 11.5–15.5)
WBC: 5.7 10*3/uL (ref 4.0–10.5)

## 2017-11-23 LAB — I-STAT TROPONIN, ED: TROPONIN I, POC: 0 ng/mL (ref 0.00–0.08)

## 2017-11-23 LAB — CBG MONITORING, ED: Glucose-Capillary: 135 mg/dL — ABNORMAL HIGH (ref 70–99)

## 2017-11-23 LAB — I-STAT BETA HCG BLOOD, ED (MC, WL, AP ONLY): I-stat hCG, quantitative: <5 m[IU]/mL (ref <5)

## 2017-11-23 MED ORDER — CYCLOBENZAPRINE HCL 5 MG PO TABS: 5.0000 mg | ORAL_TABLET | Freq: Three times a day (TID) | ORAL | 0 refills | Status: DC | PRN

## 2017-11-23 MED ORDER — LORAZEPAM 2 MG/ML IJ SOLN: 1.0000 mg | Freq: Once | INTRAMUSCULAR | Status: DC

## 2017-11-23 MED ORDER — SODIUM CHLORIDE 0.9 % IV BOLUS
1000.0000 mL | Freq: Once | INTRAVENOUS | Status: AC
  Administered 2017-11-23: 1000 mL via INTRAVENOUS

## 2017-11-23 MED ORDER — CYCLOBENZAPRINE HCL 10 MG PO TABS
5.0000 mg | ORAL_TABLET | Freq: Once | ORAL | Status: AC
  Administered 2017-11-23: 5 mg via ORAL
  Filled 2017-11-23: qty 1

## 2017-11-23 NOTE — ED Triage Notes (Signed)
Per EMS: pt from home with c/o left arm numbness and chest heaviness x1 week.  Pt denies nausea and dizziness. Pt reports being [facility] at [facility] on Friday with same symptoms that was dx as anxiety.

## 2017-11-23 NOTE — ED Notes (Signed)
E-signature not available, pt verbalized understanding of DC instructions and prescriptions 

## 2017-11-23 NOTE — ED Provider Notes (Signed)
[facility]ER[facility] DEPARTMENT Provider Note   CSN: [id number] Arrival date & time: 11/23/17  1655     History   Chief Complaint Chief Complaint  Patient presents with  . Arm Numbness  . Chest Injury    HPI [name] is a 32 y.o. female with a history of DM and asthma who presents with arm numbness.  Patient was seen in the ED 1 week prior to presentation with numbness in her hands/feet and SOB. Patient was noted to be noncompliant with her Metformin and she has been seen in the ED several times in the past for hyperglycemia. She was found to have mild hyperglycemia without evidence of ketoacidosis. Remaining lab work was unremarkable. She was given lorazepam and it was noted that symptoms completely resolved. It was thought that symptoms were 2/2 hyperventilation.   Patient reports that the numbness and SOB did not improve with the lorazepam. She has continued to have these symptoms throughout the week. Last night she had an abrupt onset of chest pain which is non-pleuritic in nature but is worsened with movement. She also endorses intermittent nausea but denies vomiting. She does not endorse SOB and chest pain currently.   Patient reports that she still has not taken her Metformin because she "wanted to see what her sugar would be like if she ate better". She reports home blood sugar's of 100's.  She denies associated neck stiffness, headaches, vision changes, weakness. She denies dysuria, hematuria, urgency. Denies changes in bowel movement.    Past Medical History:  Diagnosis Date  . Asthma   . Diabetes mellitus without complication ([place])   . Obesity     There are no active problems to display for this patient.   Past Surgical History:  Procedure Laterality Date  . CESAREAN SECTION    . TUBAL LIGATION       OB History   None      Home Medications    Prior to Admission medications   Medication Sig Start Date End Date Taking?  Authorizing Provider  albuterol (PROVENTIL HFA;VENTOLIN HFA) 108 (90 Base) MCG/ACT inhaler Inhale 1-2 puffs into the lungs every 6 (six) hours as needed for wheezing or shortness of breath. 08/16/17   Doristine Devoid, PA-C  albuterol (PROVENTIL) (5 MG/ML) 0.5% nebulizer solution Take 0.5 mLs (2.5 mg total) by nebulization every 6 (six) hours as needed for wheezing or shortness of breath. 08/16/17   Doristine Devoid, PA-C  Blood Glucose Monitoring Suppl (TRUE METRIX METER) w/Device KIT 1 each by Does not apply route 4 (four) times daily -  before meals and at bedtime. Patient not taking: Reported on 06/22/2017 12/09/15   Tresa Garter, MD  glucose blood (TRUE METRIX BLOOD GLUCOSE TEST) test strip Use as instructed Patient not taking: Reported on 06/22/2017 12/09/15   Tresa Garter, MD  ibuprofen (ADVIL,MOTRIN) 400 MG tablet Take 1 tablet (400 mg total) by mouth every 6 (six) hours as needed. Patient not taking: Reported on 08/16/2017 08/08/17   Lacretia Leigh, MD  metFORMIN (GLUCOPHAGE) 500 MG tablet Take 1 tablet (500 mg total) by mouth 2 (two) times daily with a meal. 8/50/27 7/41/28  Delora Fuel, MD  methocarbamol (ROBAXIN-750) 750 MG tablet Take 1 tablet (750 mg total) by mouth 4 (four) times daily. Patient not taking: Reported on 08/16/2017 08/08/17   Lacretia Leigh, MD  Multiple Vitamin (MULTIVITAMIN WITH MINERALS) TABS tablet Take 1 tablet by mouth daily.    [provider]  TRUEPLUS LANCETS 28G MISC 1 each by Does not apply route 4 (four) times daily - after meals and at bedtime. Patient not taking: Reported on 06/22/2017 12/09/15   Tresa Garter, MD    Family History Family History  Problem Relation Age of Onset  . Diabetes Maternal Aunt     Social History Social History   Tobacco Use  . Smoking status: Never Smoker  . Smokeless tobacco: Never Used  Substance Use Topics  . Alcohol use: No  . Drug use: No     Allergies   Other and Cefdinir   Review  of Systems Review of Systems  Constitutional: Negative for activity change, appetite change, diaphoresis, fatigue and fever.  HENT: Negative for congestion and sore throat.   Respiratory: Positive for chest tightness and shortness of breath. Negative for cough.   Cardiovascular: Positive for chest pain. Negative for palpitations and leg swelling.  Gastrointestinal: Positive for nausea. Negative for abdominal distention, abdominal pain, blood in stool, diarrhea and vomiting.  Genitourinary: Negative for dysuria, hematuria and urgency.  Musculoskeletal: Positive for back pain, joint swelling, myalgias (left upper and lower back, left bicep and forearm.) and neck pain. Negative for arthralgias, gait problem and neck stiffness.  Neurological: Positive for numbness ("Tingling" sensation of left upper and lower arm, thoracic and lumbar region, left-sided abdomen, lateral aspect of left thigh and left calf.). Negative for dizziness, weakness, light-headedness and headaches.  All other systems reviewed and are negative.   Physical Exam Updated Vital Signs LMP 10/31/2017 (Exact Date)   Physical Exam  Constitutional: She appears well-developed and well-nourished.  HENT:  Head: Normocephalic and atraumatic.  Eyes: EOM are normal.  Neck: Normal range of motion.  Cardiovascular: Normal rate and regular rhythm.  Pulmonary/Chest: Effort normal and breath sounds normal.  Abdominal: Soft. Bowel sounds are normal.  Musculoskeletal: She exhibits tenderness (Tenderness of palpation to left lateral aspect of neck, left trapezius muslcle and left paraspinal muscle. Reproducible chest pain with paplation of left chest wall. ).  Neurological: She is alert. She displays normal reflexes. A sensory deficit ("Tingling" sensation of lateral aspect of left arm and leg, lateral abdomen) is present. No cranial nerve deficit. She exhibits normal muscle tone. Coordination normal.  Skin: Skin is warm and dry.    Psychiatric: She has a normal mood and affect. Her behavior is normal.  Nursing note and vitals reviewed.    ED Treatments / Results  Labs (all labs ordered are listed, but only abnormal results are displayed) Labs Reviewed  CBC WITH DIFFERENTIAL/PLATELET - Abnormal; Notable for the following components:      Result Value   Hemoglobin 11.9 (*)    MCH 24.8 (*)    All other components within normal limits  COMPREHENSIVE METABOLIC PANEL - Abnormal; Notable for the following components:   Glucose, Bld 148 (*)    All other components within normal limits  CBG MONITORING, ED - Abnormal; Notable for the following components:   Glucose-Capillary 135 (*)    All other components within normal limits  I-STAT BETA HCG BLOOD, ED ([facility], [facility], [facility] ONLY)  I-STAT TROPONIN, ED    EKG EKG Interpretation  Date/Time:  Friday November 23 2017 17:14:30 EDT Ventricular Rate:  66 PR Interval:    QRS Duration: 77 QT Interval:  426 QTC Calculation: 447 R Axis:   68 Text Interpretation:  Sinus rhythm No significant change since last tracing Confirmed by Wandra Arthurs [phone]) on 11/23/2017 5:24:26 PM   Radiology Dg Chest  2 View  Result Date: 11/23/2017 CLINICAL DATA:  Left-sided numbness x1 week with constant dyspnea. EXAM: CHEST - 2 VIEW COMPARISON:  08/16/2017 FINDINGS: The heart size and mediastinal contours are within normal limits. Both lungs are clear. The visualized skeletal structures are unremarkable. IMPRESSION: No active cardiopulmonary disease. Electronically Signed   By: Ashley Royalty M.D.   On: 11/23/2017 18:57   Ct Head Wo Contrast  Result Date: 11/23/2017 CLINICAL DATA:  Headache and left arm numbness for the past 2 weeks. EXAM: CT HEAD WITHOUT CONTRAST TECHNIQUE: Contiguous axial images were obtained from the base of the skull through the vertex without intravenous contrast. COMPARISON:  None. FINDINGS: Brain: Normal appearing cerebral hemispheres and posterior fossa structures. Normal size and  position of the ventricles. No intracranial hemorrhage, mass lesion or CT evidence of acute infarction. Vascular: No hyperdense vessel or unexpected calcification. Skull: Normal. Negative for fracture or focal lesion. Sinuses/Orbits: Mild right maxillary sinus mucosal thickening. Unremarkable orbits. Other: None. IMPRESSION: 1. Mild chronic right maxillary sinusitis. 2. Otherwise, normal examination. Electronically Signed   By: Claudie Revering M.D.   On: 11/23/2017 18:47    Procedures Procedures (including critical care time)  Medications Ordered in ED Medications  LORazepam (ATIVAN) injection 1 mg (1 mg Intravenous Refused 11/23/17 1914)  sodium chloride 0.9 % bolus 1,000 mL (0 mLs Intravenous Stopped 11/23/17 2029)  cyclobenzaprine (FLEXERIL) tablet 5 mg (5 mg Oral Given 11/23/17 1913)     Initial Impression / Assessment and Plan / ED Course  I have reviewed the triage vital signs and the nursing notes.  Pertinent labs & imaging results that were available during my care of the patient were reviewed by me and considered in my medical decision making (see chart for details).  32 y.o. female with a history of DM and asthma who presents with left-sided numbness, SOB, and chest pain. Patient was found to have tenderness to palpation to left lateral aspect of neck, left trapezius muscle, left paraspinal muscle, and left chest wall. Patient was found to have negative troponin x1, negative B-HCG, and unremarkable CBC/CMP. Patient was found to have an unremarkable CT head and CXR. Patient's symptoms are likely musculoskeletal in nature given physical exam and unremarkable labs/imaging. Acute intracranial process is less likely given unremarkable head CT. ACS is unlikely given normal EKG in the setting reproducible pain with palpation of chest wall. Recommended follow-up with [facility] if symptoms persist. Patient given return precautions and expressed understanding/agreement. Patient discharged with  Flexeril for muscle spasms.    Final Clinical Impressions(s) / ED Diagnoses   Final diagnoses:  Paresthesias  Other chest pain  Hyperglycemia    ED Discharge Orders        Ordered    cyclobenzaprine (FLEXERIL) 5 MG tablet  3 times daily PRN     11/23/17 2120       Carroll Sage, MD 11/23/17 2336    Drenda Freeze, MD 11/24/17 1530

## 2017-11-23 NOTE — ED Notes (Signed)
Patient transported to CT 

## 2017-11-23 NOTE — ED Notes (Signed)
Pt CBG 135 

## 2017-11-23 NOTE — Discharge Instructions (Addendum)
Take motrin for pain.   Take flexeril for muscle spasms.  See your primary care doctor.   See neurology for follow up if you have persistent numbness   Return to ER if you have worse chest pain, numbness, weakness.

## 2018-01-08 ENCOUNTER — Emergency Department (HOSPITAL_COMMUNITY)
Admission: EM | Admit: 2018-01-08 | Discharge: 2018-01-08 | Disposition: A | Discharge status: Home / Self Care | Payer: Self-pay | Attending: Emergency Medicine | Admitting: Emergency Medicine

## 2018-01-08 ENCOUNTER — Other Ambulatory Visit: Payer: Self-pay

## 2018-01-08 ENCOUNTER — Encounter (HOSPITAL_COMMUNITY): Payer: Self-pay

## 2018-01-08 DIAGNOSIS — M545 Low back pain, unspecified: Secondary | ICD-10-CM

## 2018-01-08 DIAGNOSIS — J45909 Unspecified asthma, uncomplicated: Secondary | ICD-10-CM | POA: Insufficient documentation

## 2018-01-08 DIAGNOSIS — E119 Type 2 diabetes mellitus without complications: Secondary | ICD-10-CM | POA: Insufficient documentation

## 2018-01-08 DIAGNOSIS — Z79899 Other long term (current) drug therapy: Secondary | ICD-10-CM | POA: Insufficient documentation

## 2018-01-08 MED ORDER — CYCLOBENZAPRINE HCL 10 MG PO TABS: 10.0000 mg | ORAL_TABLET | Freq: Three times a day (TID) | ORAL | 0 refills | Status: DC | PRN

## 2018-01-08 NOTE — ED Triage Notes (Signed)
Pt presents to ED form home for lower back pain. 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 Tylenol is not helping the pain.

## 2018-01-08 NOTE — ED Provider Notes (Signed)
[facility] DEPT Provider Note   CSN: [id number] Arrival date & time: 01/08/18  0459     History   Chief Complaint Chief Complaint  Patient presents with  . Back Pain    HPI [name] is a 32 y.o. female.  HPI Patient is a 32 year old female presents the emergency department with 48 hours of worsening right low back pain.  She does have a lifting at work.  She feels like she may have injured or strained this at work.  This is located in her right lumbar region without radiation down her right leg.  No weakness of her arms or legs.  No direct trauma.  No fevers or chills.  Denies abdominal pain.  No urinary complaints.  Tried Tylenol without improvement in her symptoms.  Pain is mild to moderate in severity.   Past Medical History:  Diagnosis Date  . Asthma   . Diabetes mellitus without complication ([place])   . Obesity     There are no active problems to display for this patient.   Past Surgical History:  Procedure Laterality Date  . CESAREAN SECTION    . TUBAL LIGATION       OB History   None      Home Medications    Prior to Admission medications   Medication Sig Start Date End Date Taking? Authorizing Provider  albuterol (PROVENTIL HFA;VENTOLIN HFA) 108 (90 Base) MCG/ACT inhaler Inhale 1-2 puffs into the lungs every 6 (six) hours as needed for wheezing or shortness of breath. 08/16/17   Doristine Devoid, PA-C  albuterol (PROVENTIL) (5 MG/ML) 0.5% nebulizer solution Take 0.5 mLs (2.5 mg total) by nebulization every 6 (six) hours as needed for wheezing or shortness of breath. 08/16/17   Doristine Devoid, PA-C  Blood Glucose Monitoring Suppl (TRUE METRIX METER) w/Device KIT 1 each by Does not apply route 4 (four) times daily -  before meals and at bedtime. Patient not taking: Reported on 06/22/2017 12/09/15   Tresa Garter, MD  cyclobenzaprine (FLEXERIL) 10 MG tablet Take 1 tablet (10 mg total) by mouth 3 (three) times daily  as needed for muscle spasms. 01/08/18   Jola Schmidt, MD  glucose blood (TRUE METRIX BLOOD GLUCOSE TEST) test strip Use as instructed Patient not taking: Reported on 06/22/2017 12/09/15   Tresa Garter, MD  ibuprofen (ADVIL,MOTRIN) 400 MG tablet Take 1 tablet (400 mg total) by mouth every 6 (six) hours as needed. Patient not taking: Reported on 08/16/2017 08/08/17   Lacretia Leigh, MD  metFORMIN (GLUCOPHAGE) 500 MG tablet Take 1 tablet (500 mg total) by mouth 2 (two) times daily with a meal. 4/65/03 5/46/56  Delora Fuel, MD  methocarbamol (ROBAXIN-750) 750 MG tablet Take 1 tablet (750 mg total) by mouth 4 (four) times daily. Patient not taking: Reported on 08/16/2017 08/08/17   Lacretia Leigh, MD  Multiple Vitamin (MULTIVITAMIN WITH MINERALS) TABS tablet Take 1 tablet by mouth daily.    [provider]  TRUEPLUS LANCETS 28G MISC 1 each by Does not apply route 4 (four) times daily - after meals and at bedtime. Patient not taking: Reported on 06/22/2017 12/09/15   Tresa Garter, MD    Family History Family History  Problem Relation Age of Onset  . Diabetes Maternal Aunt     Social History Social History   Tobacco Use  . Smoking status: Never Smoker  . Smokeless tobacco: Never Used  Substance Use Topics  . Alcohol use: No  .  Drug use: No     Allergies   Other and Cefdinir   Review of Systems Review of Systems  All other systems reviewed and are negative.    Physical Exam Updated Vital Signs BP 133/82 (BP Location: Left Arm)   Pulse (!) 59   Temp 98.3 F (36.8 C) (Oral)   Resp 18   Ht '5\' 2"'  (1.575 m)   Wt 79.4 kg   SpO2 100%   BMI 32.01 kg/m   Physical Exam  Constitutional: She is oriented to person, place, and time. She appears well-developed and well-nourished.  HENT:  Head: Normocephalic.  Eyes: EOM are normal.  Neck: Normal range of motion.  Pulmonary/Chest: Effort normal.  Abdominal: She exhibits no distension.  Musculoskeletal: Normal range  of motion.  No thoracic or lumbar point tenderness.  Mild paralumbar tenderness on the right  Neurological: She is alert and oriented to person, place, and time. Sensory deficit:    Psychiatric: She has a normal mood and affect.  Nursing note and vitals reviewed.    ED Treatments / Results  Labs (all labs ordered are listed, but only abnormal results are displayed) Labs Reviewed - No data to display  EKG None  Radiology No results found.  Procedures Procedures (including critical care time)  Medications Ordered in ED Medications - No data to display   Initial Impression / Assessment and Plan / ED Course  I have reviewed the triage vital signs and the nursing notes.  Pertinent labs & imaging results that were available during my care of the patient were reviewed by me and considered in my medical decision making (see chart for details).     Ambulatory.  No lower extremity weakness on examination.  Patient will be discharged home with instructions for anti-inflammatories and muscle relaxants.  Primary care follow-up.  Work note written for 24 hours.  Patient encouraged to return to the emergency department for new or worsening symptoms  Final Clinical Impressions(s) / ED Diagnoses   Final diagnoses:  Acute right-sided low back pain without sciatica    ED Discharge Orders         Ordered    cyclobenzaprine (FLEXERIL) 10 MG tablet  3 times daily PRN     01/08/18 0557           Jola Schmidt, MD 01/08/18 0600

## 2018-01-08 NOTE — ED Notes (Signed)
ED Provider at bedside. 

## 2018-06-28 ENCOUNTER — Emergency Department (HOSPITAL_COMMUNITY): Payer: Self-pay

## 2018-06-28 ENCOUNTER — Encounter (HOSPITAL_COMMUNITY): Payer: Self-pay | Admitting: Emergency Medicine

## 2018-06-28 ENCOUNTER — Other Ambulatory Visit: Payer: Self-pay

## 2018-06-28 ENCOUNTER — Emergency Department (HOSPITAL_COMMUNITY)
Admission: EM | Admit: 2018-06-28 | Discharge: 2018-06-28 | Disposition: A | Discharge status: Home / Self Care | Payer: Self-pay | Attending: Emergency Medicine | Admitting: Emergency Medicine

## 2018-06-28 DIAGNOSIS — E119 Type 2 diabetes mellitus without complications: Secondary | ICD-10-CM | POA: Insufficient documentation

## 2018-06-28 DIAGNOSIS — Z79899 Other long term (current) drug therapy: Secondary | ICD-10-CM | POA: Insufficient documentation

## 2018-06-28 DIAGNOSIS — J45909 Unspecified asthma, uncomplicated: Secondary | ICD-10-CM | POA: Insufficient documentation

## 2018-06-28 DIAGNOSIS — J069 Acute upper respiratory infection, unspecified: Secondary | ICD-10-CM | POA: Insufficient documentation

## 2018-06-28 LAB — CBC WITH DIFFERENTIAL/PLATELET
Abs Immature Granulocytes: 0.02 10*3/uL (ref 0.00–0.07)
BASOS PCT: 1 %
Basophils Absolute: 0 10*3/uL (ref 0.0–0.1)
Eosinophils Absolute: 0.1 10*3/uL (ref 0.0–0.5)
Eosinophils Relative: 2 %
HCT: 39.8 % (ref 36.0–46.0)
Hemoglobin: 12.2 g/dL (ref 12.0–15.0)
Immature Granulocytes: 0 %
Lymphocytes Relative: 34 %
Lymphs Abs: 1.7 10*3/uL (ref 0.7–4.0)
MCH: 24.2 pg — ABNORMAL LOW (ref 26.0–34.0)
MCHC: 30.7 g/dL (ref 30.0–36.0)
MCV: 78.8 fL — ABNORMAL LOW (ref 80.0–100.0)
Monocytes Absolute: 0.3 10*3/uL (ref 0.1–1.0)
Monocytes Relative: 6 %
Neutro Abs: 2.8 10*3/uL (ref 1.7–7.7)
Neutrophils Relative %: 57 %
Platelets: 208 10*3/uL (ref 150–400)
RBC: 5.05 MIL/uL (ref 3.87–5.11)
RDW: 13 % (ref 11.5–15.5)
WBC: 5 10*3/uL (ref 4.0–10.5)
nRBC: 0 % (ref 0.0–0.2)

## 2018-06-28 LAB — I-STAT BETA HCG BLOOD, ED (MC, WL, AP ONLY): I-stat hCG, quantitative: <5 m[IU]/mL (ref <5)

## 2018-06-28 LAB — BASIC METABOLIC PANEL
Anion gap: 5 (ref 5–15)
BUN: 5 mg/dL — AB (ref 6–20)
CO2: 24 mmol/L (ref 22–32)
Calcium: 9 mg/dL (ref 8.9–10.3)
Chloride: 107 mmol/L (ref 98–111)
Creatinine, Ser: 0.7 mg/dL (ref 0.44–1.00)
GFR calc Af Amer: >60 mL/min (ref >60)
GFR calc non Af Amer: >60 mL/min (ref >60)
Glucose, Bld: 295 mg/dL — ABNORMAL HIGH (ref 70–99)
Potassium: 3.7 mmol/L (ref 3.5–5.1)
Sodium: 136 mmol/L (ref 135–145)

## 2018-06-28 LAB — I-STAT TROPONIN, ED: Troponin i, poc: 0 ng/mL (ref 0.00–0.08)

## 2018-06-28 LAB — INFLUENZA PANEL BY PCR (TYPE A & B)
Influenza A By PCR: NEGATIVE
Influenza B By PCR: NEGATIVE

## 2018-06-28 LAB — GROUP A STREP BY PCR: Group A Strep by PCR: NOT DETECTED

## 2018-06-28 MED ORDER — IPRATROPIUM-ALBUTEROL 0.5-2.5 (3) MG/3ML IN SOLN
3.0000 mL | Freq: Once | RESPIRATORY_TRACT | Status: AC
  Administered 2018-06-28: 3 mL via RESPIRATORY_TRACT
  Filled 2018-06-28: qty 3

## 2018-06-28 MED ORDER — ALBUTEROL SULFATE HFA 108 (90 BASE) MCG/ACT IN AERS
1.0000 | INHALATION_SPRAY | RESPIRATORY_TRACT | Status: DC | PRN
  Administered 2018-06-28: 2 via RESPIRATORY_TRACT
  Filled 2018-06-28: qty 6.7

## 2018-06-28 MED ORDER — BENZONATATE 100 MG PO CAPS: 100.0000 mg | ORAL_CAPSULE | Freq: Three times a day (TID) | ORAL | 0 refills | Status: DC

## 2018-06-28 NOTE — ED Provider Notes (Addendum)
[facility] DEPARTMENT Provider Note   CSN: [id number] Arrival date & time: 06/28/18  1022    History   Chief Complaint Chief Complaint  Patient presents with  . Chest Pain  . Cough  . Flu-Like Symptoms  . Asthma Exacerbation    HPI [name] is a 33 y.o. female with history of asthma, diabetes and obesity presenting today for 3-week history of flulike illness, 1 week history of asthma exacerbation and a 3-day history of chest pain.  Patient reports that 3 weeks ago she gradually had onset of rhinorrhea, congestion and sore throat with mild cough.  She states this continued for 2 weeks and had been slightly improving however 1 week ago she states that she developed chest tightness and wheezing.  Patient reports that she has a history of asthma and does not have albuterol at home.  Patient states that these symptoms have continued until today.  She reports that she developed chest pain throughout her entire chest that is worse with her cough, constant aching and improved with rest and not coughing.  Patient reports that she feels she needs a breathing treatment today.  Patient denies history of blood clot, hemoptysis, malignancy, extremity swelling/color change, recent immobilization/trauma or surgery, she denies hormone use.     Chest Pain  Associated symptoms: cough and fever (Subjective)   Associated symptoms: no abdominal pain, no diaphoresis, no dizziness, no dysphagia, no headache, no nausea, no numbness, no palpitations, no shortness of breath, no vomiting and no weakness   Cough  Associated symptoms: chest pain, fever (Subjective), myalgias, rhinorrhea and sore throat   Associated symptoms: no diaphoresis, no headaches and no shortness of breath     Past Medical History:  Diagnosis Date  . Asthma   . Diabetes mellitus without complication ([place])   . Obesity     There are no active problems to display for this patient.   Past  Surgical History:  Procedure Laterality Date  . CESAREAN SECTION    . TUBAL LIGATION       OB History   No obstetric history on file.      Home Medications    Prior to Admission medications   Medication Sig Start Date End Date Taking? Authorizing Provider  acetaminophen (TYLENOL) 500 MG tablet Take 500 mg by mouth every 6 (six) hours as needed for mild pain.   Yes [provider]  albuterol (PROVENTIL HFA;VENTOLIN HFA) 108 (90 Base) MCG/ACT inhaler Inhale 1-2 puffs into the lungs every 6 (six) hours as needed for wheezing or shortness of breath. 08/16/17  Yes Doristine Devoid, PA-C  metFORMIN (GLUCOPHAGE) 500 MG tablet Take 1 tablet (500 mg total) by mouth 2 (two) times daily with a meal. 05/13/24 9/48/54 Yes Delora Fuel, MD  albuterol (PROVENTIL) (5 MG/ML) 0.5% nebulizer solution Take 0.5 mLs (2.5 mg total) by nebulization every 6 (six) hours as needed for wheezing or shortness of breath. Patient not taking: Reported on 06/28/2018 08/16/17   Ocie Cornfield T, PA-C  benzonatate (TESSALON) 100 MG capsule Take 1 capsule (100 mg total) by mouth every 8 (eight) hours. 06/28/18   Nuala Alpha A, PA-C  Blood Glucose Monitoring Suppl (TRUE METRIX METER) w/Device KIT 1 each by Does not apply route 4 (four) times daily -  before meals and at bedtime. 12/09/15   Tresa Garter, MD  cyclobenzaprine (FLEXERIL) 10 MG tablet Take 1 tablet (10 mg total) by mouth 3 (three) times daily as needed for muscle  spasms. Patient not taking: Reported on 06/28/2018 01/08/18   Jola Schmidt, MD  glucose blood (TRUE METRIX BLOOD GLUCOSE TEST) test strip Use as instructed 12/09/15   Tresa Garter, MD  ibuprofen (ADVIL,MOTRIN) 400 MG tablet Take 1 tablet (400 mg total) by mouth every 6 (six) hours as needed. Patient not taking: Reported on 08/16/2017 08/08/17   Lacretia Leigh, MD  methocarbamol (ROBAXIN-750) 750 MG tablet Take 1 tablet (750 mg total) by mouth 4 (four) times daily. Patient not  taking: Reported on 08/16/2017 08/08/17   Lacretia Leigh, MD  TRUEPLUS LANCETS 28G MISC 1 each by Does not apply route 4 (four) times daily - after meals and at bedtime. 12/09/15   Tresa Garter, MD    Family History Family History  Problem Relation Age of Onset  . Diabetes Maternal Aunt     Social History Social History   Tobacco Use  . Smoking status: Never Smoker  . Smokeless tobacco: Never Used  Substance Use Topics  . Alcohol use: No  . Drug use: No     Allergies   Other and Cefdinir   Review of Systems Review of Systems  Constitutional: Positive for fever (Subjective). Negative for appetite change and diaphoresis.  HENT: Positive for congestion, rhinorrhea and sore throat. Negative for drooling, trouble swallowing and voice change.   Eyes: Negative.  Negative for visual disturbance.  Respiratory: Positive for cough and chest tightness. Negative for shortness of breath.   Cardiovascular: Positive for chest pain. Negative for palpitations and leg swelling.  Gastrointestinal: Negative.  Negative for abdominal pain, blood in stool, diarrhea, nausea and vomiting.  Musculoskeletal: Positive for arthralgias and myalgias. Negative for neck pain and neck stiffness.  Skin: Negative.  Negative for color change.  Neurological: Negative.  Negative for dizziness, syncope, weakness, numbness and headaches.  All other systems reviewed and are negative.  Physical Exam Updated Vital Signs BP 123/84 (BP Location: Right Arm)   Pulse (!) 53   Temp 97.9 F (36.6 C) (Oral)   Resp 16   Ht _0  (1.575 m)   Wt 83.9 kg   LMP 06/14/2018   SpO2 96%   BMI 33.84 kg/m   Physical Exam Constitutional:      General: She is not in acute distress.    Appearance: Normal appearance. She is well-developed. She is obese. She is not ill-appearing or diaphoretic.  HENT:     Head: Normocephalic and atraumatic.     Right Ear: Tympanic membrane, ear canal and external ear normal.     Left Ear:  Tympanic membrane, ear canal and external ear normal.     Nose: Rhinorrhea present. Rhinorrhea is clear.     Right Sinus: No maxillary sinus tenderness or frontal sinus tenderness.     Left Sinus: No maxillary sinus tenderness or frontal sinus tenderness.     Mouth/Throat:     Lips: Pink.     Mouth: Mucous membranes are moist.     Pharynx: Oropharynx is clear. Uvula midline.     Comments: The patient has normal phonation and is in control of secretions. No stridor.  Midline uvula without edema. Soft palate rises symmetrically. No tonsillar erythema, swelling or exudates. Tongue protrusion is normal, floor of mouth is soft. No trismus. No creptius on neck palpation. No gingival erythema or fluctuance noted. Mucus membranes moist. Eyes:     General: Vision grossly intact. Gaze aligned appropriately.     Conjunctiva/sclera: Conjunctivae normal.     Pupils: Pupils are  equal, round, and reactive to light.  Neck:     Musculoskeletal: Full passive range of motion without pain, normal range of motion and neck supple. No neck rigidity.     Trachea: Trachea and phonation normal. No tracheal deviation.     Meningeal: Brudzinski's sign absent.  Cardiovascular:     Rate and Rhythm: Normal rate and regular rhythm.     Pulses:          Dorsalis pedis pulses are 2+ on the right side and 2+ on the left side.       Posterior tibial pulses are 2+ on the right side and 2+ on the left side.     Heart sounds: Normal heart sounds.  Pulmonary:     Effort: Pulmonary effort is normal. No respiratory distress.     Breath sounds: Wheezing present.     Comments: Patient with mild bilateral expiratory wheezing present Chest:     Chest wall: Tenderness present. No deformity or crepitus.     Comments: Tenderness to palpation of the chest wall. Abdominal:     General: Bowel sounds are normal. There is no distension.     Palpations: Abdomen is soft.     Tenderness: There is no abdominal tenderness. There is no  guarding or rebound.  Musculoskeletal: Normal range of motion.     Right lower leg: Normal. She exhibits no tenderness. No edema.     Left lower leg: Normal. She exhibits no tenderness. No edema.  Feet:     Right foot:     Protective Sensation: 3 sites tested. 3 sites sensed.     Left foot:     Protective Sensation: 3 sites tested. 3 sites sensed.  Skin:    General: Skin is warm and dry.  Neurological:     Mental Status: She is alert.     GCS: GCS eye subscore is 4. GCS verbal subscore is 5. GCS motor subscore is 6.     Comments: Speech is clear and goal oriented, follows commands Major Cranial nerves without deficit, no facial droop Moves extremities without ataxia, coordination intact  Psychiatric:        Behavior: Behavior normal.    ED Treatments / Results  Labs (all labs ordered are listed, but only abnormal results are displayed) Labs Reviewed  CBC WITH DIFFERENTIAL/PLATELET - Abnormal; Notable for the following components:      Result Value   MCV 78.8 (*)    MCH 24.2 (*)    All other components within normal limits  BASIC METABOLIC PANEL - Abnormal; Notable for the following components:   Glucose, Bld 295 (*)    BUN 5 (*)    All other components within normal limits  GROUP A STREP BY PCR  INFLUENZA PANEL BY PCR (TYPE A & B)  I-STAT TROPONIN, ED  I-STAT BETA HCG BLOOD, ED ([facility], [facility], [facility] ONLY)    EKG EKG Interpretation  Date/Time:  Friday June 28 2018 10:48:17 EST Ventricular Rate:  54 PR Interval:  152 QRS Duration: 82 QT Interval:  396 QTC Calculation: 375 R Axis:   75 Text Interpretation:  Sinus bradycardia Otherwise normal ECG No significant change since last tracing Confirmed by Blanchie Dessert [phone]) on 06/28/2018 12:37:33 PM   Radiology Dg Chest 2 View  Result Date: 06/28/2018 CLINICAL DATA:  Flu like symptoms and cough for 3 weeks. EXAM: CHEST - 2 VIEW COMPARISON:  11/23/2017 FINDINGS: Cardiomediastinal silhouette is normal. Mediastinal contours  appear intact. There is no evidence  of focal airspace consolidation, pleural effusion or pneumothorax. Osseous structures are without acute abnormality. Soft tissues are grossly normal. IMPRESSION: No active cardiopulmonary disease. Electronically Signed   By: Fidela Salisbury M.D.   On: 06/28/2018 11:13    Procedures Procedures (including critical care time)  Medications Ordered in ED Medications  albuterol (PROVENTIL HFA;VENTOLIN HFA) 108 (90 Base) MCG/ACT inhaler 1-2 puff (has no administration in time range)  ipratropium-albuterol (DUONEB) 0.5-2.5 (3) MG/3ML nebulizer solution 3 mL (3 mLs Nebulization Given 06/28/18 1111)     Initial Impression / Assessment and Plan / ED Course  I have reviewed the triage vital signs and the nursing notes.  Pertinent labs & imaging results that were available during my care of the patient were reviewed by me and considered in my medical decision making (see chart for details).   33 year old female with history of diabetes and asthma presents today for 3-week history of flulike illness followed by 1 week history of asthma exacerbation and 3-day history of chest pain.  Patient feels that her chest pain is related to her asthma exacerbation as she has been frequently coughing.  Physical examination reveals a well-appearing female in no acute or respiratory distress.  Lung sounds with mild bilateral expiratory wheezing.  Chest is tender to palpation.  Patient is afebrile, not tachycardic, not hypotensive and with SPO2 of 100% on room air.  Patient is low risk by Wells and PERC negative.  Patient's body aches without focal tenderness, no signs of injury.  Airway is clear, no signs of peritonsillar abscess, Ludwig's angina, retropharyngeal abscess, dental abscess or other deep tissue infections of the head or neck.  She is eating and drinking in the emergency department and tolerating her secretions, no change to phonation. - EKG without acute findings reviewed  by Dr. Maryan Rued Troponin negative CBC nonacute BMP with elevated glucose, nonacute Chest x-ray negative Beta-hCG negative Influenza negative Strep test negative - Patient given DuoNeb.  On patient reassessment she is resting comfortably and in no acute distress.  Patient states that she is feeling much improved and is breathing at baseline.  On reassessment she is denying any and all pain and denies difficulty breathing.  She is requesting discharge.  Afebrile, not tachycardic, not hypotensive and without hypoxia on room air. - Patient has been given albuterol inhaler refill.  She has been offered prednisone however has refused this today.  Patient states that she plans to follow-up with her primary care doctor's office and to schedule this appointment today.  Tessalon prescribed for cough.  Discussed OTC anti-inflammatories and encouraged to rest and water intake. - Suspect patient's chest pain complaint x 3 days musculoskeletal in nature secondary to her coughing and asthma exacerbation.  Do not suspect ACS, PE, dissection or other acute cardiopulmonary etiologies of pain today.  - At this time there does not appear to be any evidence of an acute emergency medical condition and the patient appears stable for discharge with appropriate outpatient follow up. Diagnosis was discussed with patient who verbalizes understanding of care plan and is agreeable to discharge. I have discussed return precautions with patient and family who verbalize understanding of return precautions. Patient strongly encouraged to follow-up with their PCP. All questions answered.  Patient's case discussed with Dr. Maryan Rued who agrees with plan to discharge with PCP follow-up.   Note: Portions of this report may have been transcribed using voice recognition software. Every effort was made to ensure accuracy; however, inadvertent computerized transcription errors may still be present.  Final Clinical Impressions(s) / ED  Diagnoses   Final diagnoses:  Viral upper respiratory tract infection  Uncomplicated asthma, unspecified asthma severity, unspecified whether persistent    ED Discharge Orders         Ordered    benzonatate (TESSALON) 100 MG capsule  Every 8 hours     06/28/18 1343           Deliah Boston, PA-C 06/28/18 1400    Gari Crown 06/28/18 1402    Blanchie Dessert, MD 06/28/18 [phone]

## 2018-06-28 NOTE — ED Notes (Signed)
Discharge instructions and prescription discussed with Pt. Pt verbalized understanding. Pt stable and ambulatory.    

## 2018-06-28 NOTE — Discharge Instructions (Addendum)
You have been diagnosed today with viral upper respiratory tract infection and asthma exacerbation.  At this time there does not appear to be the presence of an emergent medical condition, however there is always the potential for conditions to change. Please read and follow the below instructions.  Please return to the Emergency Department immediately for any new or worsening symptoms. Please be sure to follow up with your Primary Care Provider within one week regarding your visit today; please call their office to schedule an appointment even if you are feeling better for a follow-up visit. You may use the albuterol inhaler given to you today to help with your symptoms.  Please return to the emergency department immediately if you feel that you are using this medication too often or that it is not working. You may use the Tessalon as prescribed to help with your cough. Please drink plenty water and get plenty of rest over the next few days to help with your symptoms.  You may use over-the-counter anti-inflammatory medications such as Tylenol or ibuprofen as directed on the packaging.  Get help right away if you: Feel pain or pressure in your chest. Have shortness of breath. Faint or feel like you will faint. Have severe and persistent vomiting. Feel confused or disoriented. Get help right away if: You cough up blood. You have difficulty breathing. Your heartbeat is very fast. Get help right away if: You seem to be worse and are not responding to medicine during an asthma attack. You are short of breath even at rest. You get short of breath when doing very little activity. You have trouble eating, drinking, or talking. You have chest pain or tightness. You have a fast heartbeat. Your lips or fingernails start to turn blue. You are light-headed or dizzy, or you faint. You feel too tired to breathe normally.  Please read the additional information packets attached to your discharge  summary.  Do not take your medicine if  develop an itchy rash, swelling in your mouth or lips, or difficulty breathing.

## 2018-06-28 NOTE — ED Triage Notes (Signed)
Pt presents to the ED for flu-like symptoms for weeks, cough 3 weeks, asthma exacerbation for 1 week, and chest pains for 3 days.

## 2019-10-04 ENCOUNTER — Other Ambulatory Visit: Payer: Self-pay

## 2019-10-04 ENCOUNTER — Emergency Department (HOSPITAL_COMMUNITY): Payer: Self-pay

## 2019-10-04 ENCOUNTER — Emergency Department (HOSPITAL_COMMUNITY)
Admission: EM | Admit: 2019-10-04 | Discharge: 2019-10-05 | Disposition: A | Discharge status: Home / Self Care | Payer: Self-pay | Attending: Emergency Medicine | Admitting: Emergency Medicine

## 2019-10-04 DIAGNOSIS — M542 Cervicalgia: Secondary | ICD-10-CM | POA: Insufficient documentation

## 2019-10-04 DIAGNOSIS — M546 Pain in thoracic spine: Secondary | ICD-10-CM | POA: Insufficient documentation

## 2019-10-04 DIAGNOSIS — Z7984 Long term (current) use of oral hypoglycemic drugs: Secondary | ICD-10-CM | POA: Insufficient documentation

## 2019-10-04 DIAGNOSIS — R0602 Shortness of breath: Secondary | ICD-10-CM | POA: Insufficient documentation

## 2019-10-04 DIAGNOSIS — R0789 Other chest pain: Secondary | ICD-10-CM | POA: Insufficient documentation

## 2019-10-04 DIAGNOSIS — E119 Type 2 diabetes mellitus without complications: Secondary | ICD-10-CM | POA: Insufficient documentation

## 2019-10-04 DIAGNOSIS — R079 Chest pain, unspecified: Secondary | ICD-10-CM

## 2019-10-04 DIAGNOSIS — J45909 Unspecified asthma, uncomplicated: Secondary | ICD-10-CM | POA: Insufficient documentation

## 2019-10-04 LAB — BASIC METABOLIC PANEL
Anion gap: 12 (ref 5–15)
BUN: 24 mg/dL — ABNORMAL HIGH (ref 6–20)
CO2: 22 mmol/L (ref 22–32)
Calcium: 9 mg/dL (ref 8.9–10.3)
Chloride: 106 mmol/L (ref 98–111)
Creatinine, Ser: 1.31 mg/dL — ABNORMAL HIGH (ref 0.44–1.00)
GFR calc Af Amer: >60 mL/min (ref >60)
GFR calc non Af Amer: 53 mL/min — ABNORMAL LOW (ref >60)
Glucose, Bld: 264 mg/dL — ABNORMAL HIGH (ref 70–99)
Potassium: 4.2 mmol/L (ref 3.5–5.1)
Sodium: 140 mmol/L (ref 135–145)

## 2019-10-04 LAB — CBC WITH DIFFERENTIAL/PLATELET
Abs Immature Granulocytes: 0.03 10*3/uL (ref 0.00–0.07)
Basophils Absolute: 0 10*3/uL (ref 0.0–0.1)
Basophils Relative: 1 %
Eosinophils Absolute: 0.2 10*3/uL (ref 0.0–0.5)
Eosinophils Relative: 2 %
HCT: 34.9 % — ABNORMAL LOW (ref 36.0–46.0)
Hemoglobin: 11.2 g/dL — ABNORMAL LOW (ref 12.0–15.0)
Immature Granulocytes: 1 %
Lymphocytes Relative: 26 %
Lymphs Abs: 1.7 10*3/uL (ref 0.7–4.0)
MCH: 24.9 pg — ABNORMAL LOW (ref 26.0–34.0)
MCHC: 32.1 g/dL (ref 30.0–36.0)
MCV: 77.7 fL — ABNORMAL LOW (ref 80.0–100.0)
Monocytes Absolute: 0.4 10*3/uL (ref 0.1–1.0)
Monocytes Relative: 7 %
Neutro Abs: 4.3 10*3/uL (ref 1.7–7.7)
Neutrophils Relative %: 63 %
Platelets: 253 10*3/uL (ref 150–400)
RBC: 4.49 MIL/uL (ref 3.87–5.11)
RDW: 14.1 % (ref 11.5–15.5)
WBC: 6.6 10*3/uL (ref 4.0–10.5)
nRBC: 0 % (ref 0.0–0.2)

## 2019-10-04 LAB — I-STAT BETA HCG BLOOD, ED (MC, WL, AP ONLY): I-stat hCG, quantitative: <5 m[IU]/mL (ref <5)

## 2019-10-04 LAB — TROPONIN I (HIGH SENSITIVITY): Troponin I (High Sensitivity): <2 ng/L (ref <18)

## 2019-10-04 MED ORDER — PANTOPRAZOLE SODIUM 20 MG PO TBEC: 20.0000 mg | DELAYED_RELEASE_TABLET | Freq: Every day | ORAL | 0 refills | Status: DC

## 2019-10-04 MED ORDER — METHOCARBAMOL 500 MG PO TABS: 500.0000 mg | ORAL_TABLET | Freq: Two times a day (BID) | ORAL | 0 refills | Status: DC

## 2019-10-04 MED ORDER — NAPROXEN 500 MG PO TABS: 500.0000 mg | ORAL_TABLET | Freq: Two times a day (BID) | ORAL | 0 refills | Status: DC

## 2019-10-04 NOTE — ED Triage Notes (Signed)
Pt arrived via EMS from home. Pt states that she has had chest pain for the past to three days,; She currently rates it at a 7 out of 10. Pt also complains of dizziness when she lays down that is associated with a rasing heart.  EMS vitals WDl. EMS gave 324 Asprin and 1 nitro. Pain decreased from 8-9 to currently 7 upon her arrival. A7O x 4

## 2019-10-04 NOTE — ED Notes (Addendum)
Pt stated that she had a PCP and that he is aware of her complaints related to Heart Pain, She stated she has been told she has reflux.  S1,S2 clear, lung sounds clear.   Pulses in all extremities are palpaple

## 2019-10-04 NOTE — ED Provider Notes (Signed)
[facility] EMERGENCY DEPARTMENT Provider Note   CSN: [id number] Arrival date & time: 10/04/19  2049     History Chief Complaint  Patient presents with  . Chest Pain    [name] is a 34 y.o. female with a past medical history significant for asthma and diabetes who presents to the ED due to central chest pain for the past 3 days.  Patient states her chest pain started out intermittently typically when lying flat; however, has progressed to a constant pressure-like pain over the past day. She notes when she lies flat, she feels a pain in her stomach that then goes up into her chest. Chest pain associated with shortness of breath. She notes when she feels the chest pain she also experiences palpitations and nausea. She admits to diaphoresis only at night. She rates her pain a 7/10. No recent illness. No relationship to exertion. Denies history of blood clots, recent surgeries, recent long immobilizations, and hormonal treatments.  Denies tobacco, drug, and alcohol use.  Denies family history of early CAD.  She was given 324 ASA and 1 nitro via EMS in route to the ED which completely resolved her symptoms.  She is currently chest pain-free.  Patient also admits to neck/thoracic back pain that has been present for numerous weeks. She notes pain is worse with palpation with intermittent episodes of numbness/tingling down right hand. Denies injury.   History obtained from patient and past medical records. No interpreter used during encounter.      Past Medical History:  Diagnosis Date  . Asthma   . Diabetes mellitus without complication ([place])   . Obesity     There are no problems to display for this patient.   Past Surgical History:  Procedure Laterality Date  . CESAREAN SECTION    . TUBAL LIGATION       OB History   No obstetric history on file.     Family History  Problem Relation Age of Onset  . Diabetes Maternal Aunt     Social History    Tobacco Use  . Smoking status: Never Smoker  . Smokeless tobacco: Never Used  Substance Use Topics  . Alcohol use: No  . Drug use: No    Home Medications Prior to Admission medications   Medication Sig Start Date End Date Taking? Authorizing Provider  acetaminophen (TYLENOL) 500 MG tablet Take 500 mg by mouth every 6 (six) hours as needed for mild pain.    [provider]  albuterol (PROVENTIL HFA;VENTOLIN HFA) 108 (90 Base) MCG/ACT inhaler Inhale 1-2 puffs into the lungs every 6 (six) hours as needed for wheezing or shortness of breath. 08/16/17   Doristine Devoid, PA-C  albuterol (PROVENTIL) (5 MG/ML) 0.5% nebulizer solution Take 0.5 mLs (2.5 mg total) by nebulization every 6 (six) hours as needed for wheezing or shortness of breath. Patient not taking: Reported on 06/28/2018 08/16/17   Ocie Cornfield T, PA-C  benzonatate (TESSALON) 100 MG capsule Take 1 capsule (100 mg total) by mouth every 8 (eight) hours. 06/28/18   Nuala Alpha A, PA-C  Blood Glucose Monitoring Suppl (TRUE METRIX METER) w/Device KIT 1 each by Does not apply route 4 (four) times daily -  before meals and at bedtime. 12/09/15   Tresa Garter, MD  cyclobenzaprine (FLEXERIL) 10 MG tablet Take 1 tablet (10 mg total) by mouth 3 (three) times daily as needed for muscle spasms. Patient not taking: Reported on 06/28/2018 01/08/18   Jola Schmidt, MD  glucose blood (TRUE METRIX BLOOD GLUCOSE TEST) test strip Use as instructed 12/09/15   Tresa Garter, MD  ibuprofen (ADVIL,MOTRIN) 400 MG tablet Take 1 tablet (400 mg total) by mouth every 6 (six) hours as needed. Patient not taking: Reported on 08/16/2017 08/08/17   Lacretia Leigh, MD  metFORMIN (GLUCOPHAGE) 500 MG tablet Take 1 tablet (500 mg total) by mouth 2 (two) times daily with a meal. 11/05/14 0/10/93  Delora Fuel, MD  methocarbamol (ROBAXIN) 500 MG tablet Take 1 tablet (500 mg total) by mouth 2 (two) times daily. 10/04/19   Suzy Bouchard, PA-C   methocarbamol (ROBAXIN-750) 750 MG tablet Take 1 tablet (750 mg total) by mouth 4 (four) times daily. Patient not taking: Reported on 08/16/2017 08/08/17   Lacretia Leigh, MD  naproxen (NAPROSYN) 500 MG tablet Take 1 tablet (500 mg total) by mouth 2 (two) times daily. 10/04/19   Suzy Bouchard, PA-C  pantoprazole (PROTONIX) 20 MG tablet Take 1 tablet (20 mg total) by mouth daily. 10/04/19   Suzy Bouchard, PA-C  TRUEPLUS LANCETS 28G MISC 1 each by Does not apply route 4 (four) times daily - after meals and at bedtime. 12/09/15   Tresa Garter, MD    Allergies    Other and Cefdinir  Review of Systems   Review of Systems  Constitutional: Negative for chills and fever.  Respiratory: Positive for shortness of breath.   Cardiovascular: Positive for chest pain. Negative for leg swelling.  Gastrointestinal: Positive for abdominal pain and nausea. Negative for diarrhea and vomiting.  Musculoskeletal: Positive for back pain.  All other systems reviewed and are negative.   Physical Exam Updated Vital Signs BP 116/68   Pulse 82   Temp 98.7 F (37.1 C) (Oral)   Resp 15   Ht '5\' 2"'  (1.575 m)   Wt 78.5 kg   SpO2 100%   BMI 31.64 kg/m   Physical Exam Vitals and nursing note reviewed.  Constitutional:      General: She is not in acute distress.    Appearance: She is not ill-appearing.  HENT:     Head: Normocephalic.  Eyes:     Pupils: Pupils are equal, round, and reactive to light.  Cardiovascular:     Rate and Rhythm: Normal rate and regular rhythm.     Pulses: Normal pulses.     Heart sounds: Normal heart sounds. No murmur. No friction rub. No gallop.   Pulmonary:     Effort: Pulmonary effort is normal.     Breath sounds: Normal breath sounds.  Chest:     Comments: No anterior chest wall tenderness. Abdominal:     General: Abdomen is flat. Bowel sounds are normal. There is no distension.     Palpations: Abdomen is soft.     Tenderness: There is no abdominal  tenderness. There is no guarding or rebound.  Musculoskeletal:     Cervical back: Neck supple.     Comments: No lower extremity edema.  Negative Homans' sign bilaterally. Reproducible bilateral thoracic paraspinal tenderness. No midline tenderness. Upper extremities neurovascularly intact bilaterally. Equal grip strength.  Skin:    General: Skin is warm and dry.  Neurological:     General: No focal deficit present.     Mental Status: She is alert.  Psychiatric:        Mood and Affect: Mood normal.        Behavior: Behavior normal.       ED Results / Procedures / Treatments  Labs (all labs ordered are listed, but only abnormal results are displayed) Labs Reviewed  CBC WITH DIFFERENTIAL/PLATELET - Abnormal; Notable for the following components:      Result Value   Hemoglobin 11.2 (*)    HCT 34.9 (*)    MCV 77.7 (*)    MCH 24.9 (*)    All other components within normal limits  BASIC METABOLIC PANEL - Abnormal; Notable for the following components:   Glucose, Bld 264 (*)    BUN 24 (*)    Creatinine, Ser 1.31 (*)    GFR calc non Af Amer 53 (*)    All other components within normal limits  I-STAT BETA HCG BLOOD, ED ([facility], [facility], [facility] ONLY)  TROPONIN I (HIGH SENSITIVITY)    EKG None  Radiology DG Chest Portable 1 View  Result Date: 10/04/2019 CLINICAL DATA:  Chest pain. EXAM: PORTABLE CHEST 1 VIEW COMPARISON:  None. FINDINGS: The heart size and mediastinal contours are within normal limits. Both lungs are clear. The visualized skeletal structures are unremarkable. IMPRESSION: No active disease. Electronically Signed   By: Dorise Bullion III M.D   On: 10/04/2019 21:17    Procedures Procedures (including critical care time)  Medications Ordered in ED Medications - No data to display  ED Course  I have reviewed the triage vital signs and the nursing notes.  Pertinent labs & imaging results that were available during my care of the patient were reviewed by me and considered in  my medical decision making (see chart for details).    MDM Rules/Calculators/A&P                     34 year old female presents to the ED due to chest pain for the past 3 days which initially was intermittent and now a constant pressure like sensation, worse when lying flat. Upon arrival, patient is afebrile, not tachycardic or hypoxic.  Patient in no acute distress and nonill appearing.  Physical exam reassuring.  Will obtain routine labs and troponin to rule out ACS. Chest pain sounds atypical given it is typically when she lies flat and starts in her stomach. Possible reflux ertiology. PERC negative and low risk using wells criteria, doubt PE/DVT.   CBC reassuring with no leukocytosis.  EKG personally reviewed which demonstrates normal sinus rhythm with no signs of acute ischemia.  Pregnancy test negative.  BMP significant for hyperglycemia at 264 with no anion gap. Doubt DKA. Mild AKI with creatinine at 1.31 and BUN at 24. Discussed results with patient and patient notes she is ready to go home instead of receiving IVFs. Advised patient to drink fluids when she gets home. Troponin normal at <2. No delta needed given chest pain has been constant all day. Low suspicion for ACS. Dissection was considered, but thought to be less likely given patient's presentation. Suspect symptoms related to GERD. Will start patient on PPI for possible reflux. Will also discharge patient with muscle relaxer for neck/back pain. Suspect MSK etiology given reproducible nature on exam. Advised patient to follow-up with PCP within the next week for further evaluation. Strict ED precautions discussed with patient. Patient states understanding and agrees to plan. Patient discharged home in no acute distress and stable vitals  Final Clinical Impression(s) / ED Diagnoses Final diagnoses:  Nonspecific chest pain    Rx / DC Orders ED Discharge Orders         Ordered    pantoprazole (PROTONIX) 20 MG tablet  Daily  10/04/19 2331    methocarbamol (ROBAXIN) 500 MG tablet  2 times daily     10/04/19 2331    naproxen (NAPROSYN) 500 MG tablet  2 times daily     10/04/19 2331           Karie Kirks 10/04/19 2336    Deno Etienne, DO 10/05/19 1459

## 2019-10-04 NOTE — Discharge Instructions (Signed)
As discussed, all of your labs are reassuring today.  Your cardiac marker was normal.  I suspect your chest pain is related to reflux.  I am sending you home with reflux medication.  Take as prescribed. As discussed, your labs show you are slightly dehydrated, please continue to drink fluids when you get home. Follow-up with PCP within the next week for further evaluation of chest pain. I am also sending you home with pain medication and a muscle relaxer for your neck/back pain. Take as prescribed. Return to the ER for new or worsening symptoms.

## 2019-11-24 ENCOUNTER — Encounter (HOSPITAL_BASED_OUTPATIENT_CLINIC_OR_DEPARTMENT_OTHER): Payer: Self-pay

## 2019-11-24 ENCOUNTER — Emergency Department (HOSPITAL_BASED_OUTPATIENT_CLINIC_OR_DEPARTMENT_OTHER)
Admission: EM | Admit: 2019-11-24 | Discharge: 2019-11-24 | Disposition: A | Discharge status: Home / Self Care | Payer: Self-pay | Attending: Emergency Medicine | Admitting: Emergency Medicine

## 2019-11-24 ENCOUNTER — Other Ambulatory Visit: Payer: Self-pay

## 2019-11-24 DIAGNOSIS — R358 Other polyuria: Secondary | ICD-10-CM | POA: Insufficient documentation

## 2019-11-24 DIAGNOSIS — Z7984 Long term (current) use of oral hypoglycemic drugs: Secondary | ICD-10-CM | POA: Insufficient documentation

## 2019-11-24 DIAGNOSIS — R739 Hyperglycemia, unspecified: Secondary | ICD-10-CM

## 2019-11-24 DIAGNOSIS — J45909 Unspecified asthma, uncomplicated: Secondary | ICD-10-CM | POA: Insufficient documentation

## 2019-11-24 DIAGNOSIS — E1165 Type 2 diabetes mellitus with hyperglycemia: Secondary | ICD-10-CM | POA: Insufficient documentation

## 2019-11-24 DIAGNOSIS — Z79899 Other long term (current) drug therapy: Secondary | ICD-10-CM | POA: Insufficient documentation

## 2019-11-24 DIAGNOSIS — M546 Pain in thoracic spine: Secondary | ICD-10-CM | POA: Insufficient documentation

## 2019-11-24 LAB — PREGNANCY, URINE: Preg Test, Ur: NEGATIVE

## 2019-11-24 LAB — CBC
HCT: 37.3 % (ref 36.0–46.0)
Hemoglobin: 12 g/dL (ref 12.0–15.0)
MCH: 24.2 pg — ABNORMAL LOW (ref 26.0–34.0)
MCHC: 32.2 g/dL (ref 30.0–36.0)
MCV: 75.4 fL — ABNORMAL LOW (ref 80.0–100.0)
Platelets: 221 10*3/uL (ref 150–400)
RBC: 4.95 MIL/uL (ref 3.87–5.11)
RDW: 14.4 % (ref 11.5–15.5)
WBC: 7.9 10*3/uL (ref 4.0–10.5)
nRBC: 0 % (ref 0.0–0.2)

## 2019-11-24 LAB — COMPREHENSIVE METABOLIC PANEL
ALT: 15 U/L (ref 0–44)
AST: 13 U/L — ABNORMAL LOW (ref 15–41)
Albumin: 3.8 g/dL (ref 3.5–5.0)
Alkaline Phosphatase: 65 U/L (ref 38–126)
Anion gap: 10 (ref 5–15)
BUN: 22 mg/dL — ABNORMAL HIGH (ref 6–20)
CO2: 21 mmol/L — ABNORMAL LOW (ref 22–32)
Calcium: 9.1 mg/dL (ref 8.9–10.3)
Chloride: 106 mmol/L (ref 98–111)
Creatinine, Ser: 0.8 mg/dL (ref 0.44–1.00)
GFR calc Af Amer: >60 mL/min (ref >60)
GFR calc non Af Amer: >60 mL/min (ref >60)
Glucose, Bld: 229 mg/dL — ABNORMAL HIGH (ref 70–99)
Potassium: 3.8 mmol/L (ref 3.5–5.1)
Sodium: 137 mmol/L (ref 135–145)
Total Bilirubin: 0.6 mg/dL (ref 0.3–1.2)
Total Protein: 7.7 g/dL (ref 6.5–8.1)

## 2019-11-24 LAB — LIPASE, BLOOD: Lipase: 50 U/L (ref 11–51)

## 2019-11-24 LAB — URINALYSIS, ROUTINE W REFLEX MICROSCOPIC
Bilirubin Urine: NEGATIVE
Glucose, UA: >=500 mg/dL — AB
Hgb urine dipstick: NEGATIVE
Ketones, ur: 40 mg/dL — AB
Leukocytes,Ua: NEGATIVE
Nitrite: NEGATIVE
Protein, ur: NEGATIVE mg/dL
Specific Gravity, Urine: 1.02 (ref 1.005–1.030)
pH: 5.5 (ref 5.0–8.0)

## 2019-11-24 LAB — URINALYSIS, MICROSCOPIC (REFLEX)

## 2019-11-24 LAB — CBG MONITORING, ED: Glucose-Capillary: 202 mg/dL — ABNORMAL HIGH (ref 70–99)

## 2019-11-24 MED ORDER — SODIUM CHLORIDE 0.9 % IV BOLUS
1000.0000 mL | Freq: Once | INTRAVENOUS | Status: AC
  Administered 2019-11-24: 1000 mL via INTRAVENOUS

## 2019-11-24 MED ORDER — PREDNISONE 10 MG PO TABS
20.0000 mg | ORAL_TABLET | Freq: Every day | ORAL | 0 refills | Status: DC
Start: 2019-11-24 — End: 2020-07-22

## 2019-11-24 MED ORDER — ONDANSETRON 4 MG PO TBDP
4.0000 mg | ORAL_TABLET | Freq: Three times a day (TID) | ORAL | 0 refills | Status: DC | PRN
Start: 2019-11-24 — End: 2020-05-08

## 2019-11-24 NOTE — ED Provider Notes (Signed)
[facility] EMERGENCY DEPARTMENT Provider Note   CSN: [id number] Arrival date & time: 11/24/19  1629     History Chief Complaint  Patient presents with  . Back Pain    [name] is a 34 y.o. female.  HPI Patient presents with back pain.  Has had on and off for couple months but worse over the last week.  States it is in her mid back.  Had previously been on Robaxin and had been on Neurontin.  States pain is to be up in her neck but now more in the back.  No fevers or chills.  States however she is diabetic and had stopped taking her Metformin because it made her nauseous and made her vomit.  Sugars been running high up to 400.  States she had been urinating frequently.  No dysuria.  No numbness or weakness.  Denies possibly pregnancy.  No cough.    Past Medical History:  Diagnosis Date  . Asthma   . Diabetes mellitus without complication ([place])   . Obesity     There are no problems to display for this patient.   Past Surgical History:  Procedure Laterality Date  . CESAREAN SECTION    . TUBAL LIGATION       OB History   No obstetric history on file.     Family History  Problem Relation Age of Onset  . Diabetes Maternal Aunt     Social History   Tobacco Use  . Smoking status: Never Smoker  . Smokeless tobacco: Never Used  Vaping Use  . Vaping Use: Never used  Substance Use Topics  . Alcohol use: No  . Drug use: No    Home Medications Prior to Admission medications   Medication Sig Start Date End Date Taking? Authorizing Provider  acetaminophen (TYLENOL) 500 MG tablet Take 500 mg by mouth every 6 (six) hours as needed for mild pain.    [provider]  albuterol (PROVENTIL HFA;VENTOLIN HFA) 108 (90 Base) MCG/ACT inhaler Inhale 1-2 puffs into the lungs every 6 (six) hours as needed for wheezing or shortness of breath. 08/16/17   Doristine Devoid, PA-C  albuterol (PROVENTIL) (5 MG/ML) 0.5% nebulizer solution Take 0.5 mLs (2.5 mg  total) by nebulization every 6 (six) hours as needed for wheezing or shortness of breath. Patient not taking: Reported on 06/28/2018 08/16/17   Ocie Cornfield T, PA-C  benzonatate (TESSALON) 100 MG capsule Take 1 capsule (100 mg total) by mouth every 8 (eight) hours. 06/28/18   Nuala Alpha A, PA-C  Blood Glucose Monitoring Suppl (TRUE METRIX METER) w/Device KIT 1 each by Does not apply route 4 (four) times daily -  before meals and at bedtime. 12/09/15   Tresa Garter, MD  cyclobenzaprine (FLEXERIL) 10 MG tablet Take 1 tablet (10 mg total) by mouth 3 (three) times daily as needed for muscle spasms. Patient not taking: Reported on 06/28/2018 01/08/18   Jola Schmidt, MD  glucose blood (TRUE METRIX BLOOD GLUCOSE TEST) test strip Use as instructed 12/09/15   Tresa Garter, MD  ibuprofen (ADVIL,MOTRIN) 400 MG tablet Take 1 tablet (400 mg total) by mouth every 6 (six) hours as needed. Patient not taking: Reported on 08/16/2017 08/08/17   Lacretia Leigh, MD  metFORMIN (GLUCOPHAGE) 500 MG tablet Take 1 tablet (500 mg total) by mouth 2 (two) times daily with a meal. 5/00/93 12/24/27  Delora Fuel, MD  methocarbamol (ROBAXIN) 500 MG tablet Take 1 tablet (500 mg total) by mouth  2 (two) times daily. 10/04/19   Suzy Bouchard, PA-C  methocarbamol (ROBAXIN-750) 750 MG tablet Take 1 tablet (750 mg total) by mouth 4 (four) times daily. Patient not taking: Reported on 08/16/2017 08/08/17   Lacretia Leigh, MD  naproxen (NAPROSYN) 500 MG tablet Take 1 tablet (500 mg total) by mouth 2 (two) times daily. 10/04/19   Suzy Bouchard, PA-C  ondansetron (ZOFRAN-ODT) 4 MG disintegrating tablet Take 1 tablet (4 mg total) by mouth every 8 (eight) hours as needed for nausea or vomiting. 11/24/19   Davonna Belling, MD  pantoprazole (PROTONIX) 20 MG tablet Take 1 tablet (20 mg total) by mouth daily. 10/04/19   Suzy Bouchard, PA-C  predniSONE (DELTASONE) 10 MG tablet Take 2 tablets (20 mg total) by mouth daily.  11/24/19   Davonna Belling, MD  TRUEPLUS LANCETS 28G MISC 1 each by Does not apply route 4 (four) times daily - after meals and at bedtime. 12/09/15   Tresa Garter, MD    Allergies    Other and Cefdinir  Review of Systems   Review of Systems  Constitutional: Negative for appetite change and unexpected weight change.  HENT: Negative for congestion.   Respiratory: Negative for shortness of breath.   Cardiovascular: Negative for chest pain.  Gastrointestinal: Negative for abdominal pain.  Endocrine: Positive for polyuria.  Musculoskeletal: Positive for back pain.  Skin: Negative for rash.  Neurological: Negative for weakness.  Psychiatric/Behavioral: Negative for confusion.    Physical Exam Updated Vital Signs BP 119/80 (BP Location: Right Arm)   Pulse 60   Temp 98.4 F (36.9 C) (Oral)   Resp 18   Ht '5\' 2"'  (1.575 m)   Wt 78.5 kg   LMP 10/30/2019   SpO2 97%   BMI 31.64 kg/m   Physical Exam Vitals and nursing note reviewed.  Constitutional:      Appearance: Normal appearance.  HENT:     Head: Normocephalic.     Mouth/Throat:     Mouth: Mucous membranes are moist.  Eyes:     Extraocular Movements: Extraocular movements intact.  Cardiovascular:     Rate and Rhythm: Regular rhythm.  Pulmonary:     Breath sounds: No wheezing, rhonchi or rales.  Abdominal:     Tenderness: There is no abdominal tenderness.  Musculoskeletal:     Cervical back: Neck supple.     Right lower leg: No edema.     Left lower leg: No edema.     Comments: Mild thoracic muscular tenderness.  No midline tenderness.  Skin:    General: Skin is warm.     Capillary Refill: Capillary refill takes less than 2 seconds.  Neurological:     Mental Status: She is alert and oriented to person, place, and time.     ED Results / Procedures / Treatments   Labs (all labs ordered are listed, but only abnormal results are displayed) Labs Reviewed  COMPREHENSIVE METABOLIC PANEL - Abnormal; Notable  for the following components:      Result Value   CO2 21 (*)    Glucose, Bld 229 (*)    BUN 22 (*)    AST 13 (*)    All other components within normal limits  CBC - Abnormal; Notable for the following components:   MCV 75.4 (*)    MCH 24.2 (*)    All other components within normal limits  URINALYSIS, ROUTINE W REFLEX MICROSCOPIC - Abnormal; Notable for the following components:   Glucose, UA >=500 (*)  Ketones, ur 40 (*)    All other components within normal limits  URINALYSIS, MICROSCOPIC (REFLEX) - Abnormal; Notable for the following components:   Bacteria, UA FEW (*)    All other components within normal limits  CBG MONITORING, ED - Abnormal; Notable for the following components:   Glucose-Capillary 202 (*)    All other components within normal limits  LIPASE, BLOOD  PREGNANCY, URINE    EKG None  Radiology No results found.  Procedures Procedures (including critical care time)  Medications Ordered in ED Medications  sodium chloride 0.9 % bolus 1,000 mL (1,000 mLs Intravenous New Bag/Given 11/24/19 2224)    ED Course  I have reviewed the triage vital signs and the nursing notes.  Pertinent labs & imaging results that were available during my care of the patient were reviewed by me and considered in my medical decision making (see chart for details).    MDM Rules/Calculators/A&P                         Patient with back pain and hyperglycemia.  Back pain will be treated with a low dose of steroids.  However with her hyperglycemia she needs better control.  Has not been taking her Metformin because of the nauseousness and vomiting had been given here.  Will give some Zofran to help with this.  Will try short course of only 20 mg of prednisone to see if this helps.  Will need to follow-up with her PCP.  Will discharge home. Final Clinical Impression(s) / ED Diagnoses Final diagnoses:  Acute midline thoracic back pain  Hyperglycemia    Rx / DC Orders ED Discharge  Orders         Ordered    ondansetron (ZOFRAN-ODT) 4 MG disintegrating tablet  Every 8 hours PRN     Discontinue  Reprint     11/24/19 2303    predniSONE (DELTASONE) 10 MG tablet  Daily     Discontinue  Reprint     11/24/19 2303           Davonna Belling, MD 11/24/19 2330

## 2019-11-24 NOTE — ED Notes (Signed)
Pt reports not taking her Metformin r/t upset stomach

## 2019-11-24 NOTE — ED Triage Notes (Signed)
Pt arrives ambulatory via [facility] from home with reports of lower bacn, pt reports she ahs not been taking her metformin and has had blood sugar readings as high as 400 at home with increased urination. Pt has been using gabapentin for back pain reports known pinched nerve to neck X1 month.

## 2019-11-27 ENCOUNTER — Encounter (HOSPITAL_COMMUNITY): Payer: Self-pay

## 2019-11-27 ENCOUNTER — Emergency Department (HOSPITAL_COMMUNITY)
Admission: EM | Admit: 2019-11-27 | Discharge: 2019-11-27 | Disposition: A | Discharge status: Home / Self Care | Payer: Self-pay | Attending: Emergency Medicine | Admitting: Emergency Medicine

## 2019-11-27 DIAGNOSIS — Z5321 Procedure and treatment not carried out due to patient leaving prior to being seen by health care provider: Secondary | ICD-10-CM | POA: Insufficient documentation

## 2019-11-27 DIAGNOSIS — R109 Unspecified abdominal pain: Secondary | ICD-10-CM | POA: Insufficient documentation

## 2019-11-27 NOTE — ED Notes (Signed)
Patient requesting IV removal to leave. EMS IV removed from right AC. Patient speaking in full sentences without difficulty in NAD at this time. Reports she is leaving.

## 2019-11-27 NOTE — ED Triage Notes (Signed)
Pt presents via EMS with c/o allergic reaction to shellfish, unknown allergy to this. Pt was given epi .3 IM, 50mg  benadryl IV, 4 mg zofran IV. Pt c/o abdominal pain and no shortness of breath at this time, this has resolved. Pt is alert and oriented, no distress.

## 2020-01-26 ENCOUNTER — Other Ambulatory Visit: Payer: Self-pay | Admitting: Occupational Medicine

## 2020-01-26 ENCOUNTER — Other Ambulatory Visit: Payer: Self-pay

## 2020-01-26 ENCOUNTER — Ambulatory Visit: Payer: Self-pay

## 2020-01-26 DIAGNOSIS — M25572 Pain in left ankle and joints of left foot: Secondary | ICD-10-CM

## 2020-02-25 ENCOUNTER — Emergency Department (HOSPITAL_COMMUNITY)
Admission: EM | Admit: 2020-02-25 | Discharge: 2020-02-25 | Disposition: A | Discharge status: Home / Self Care | Payer: Self-pay | Attending: Emergency Medicine | Admitting: Emergency Medicine

## 2020-02-25 ENCOUNTER — Other Ambulatory Visit: Payer: Self-pay

## 2020-02-25 ENCOUNTER — Emergency Department (HOSPITAL_COMMUNITY): Payer: Self-pay

## 2020-02-25 DIAGNOSIS — M79602 Pain in left arm: Secondary | ICD-10-CM | POA: Insufficient documentation

## 2020-02-25 DIAGNOSIS — J45909 Unspecified asthma, uncomplicated: Secondary | ICD-10-CM | POA: Insufficient documentation

## 2020-02-25 DIAGNOSIS — Z20822 Contact with and (suspected) exposure to covid-19: Secondary | ICD-10-CM | POA: Insufficient documentation

## 2020-02-25 DIAGNOSIS — E119 Type 2 diabetes mellitus without complications: Secondary | ICD-10-CM | POA: Insufficient documentation

## 2020-02-25 DIAGNOSIS — R197 Diarrhea, unspecified: Secondary | ICD-10-CM | POA: Insufficient documentation

## 2020-02-25 DIAGNOSIS — R109 Unspecified abdominal pain: Secondary | ICD-10-CM | POA: Insufficient documentation

## 2020-02-25 LAB — BASIC METABOLIC PANEL
Anion gap: 12 (ref 5–15)
BUN: 13 mg/dL (ref 6–20)
CO2: 24 mmol/L (ref 22–32)
Calcium: 9.6 mg/dL (ref 8.9–10.3)
Chloride: 104 mmol/L (ref 98–111)
Creatinine, Ser: 0.8 mg/dL (ref 0.44–1.00)
GFR, Estimated: >60 mL/min (ref >60)
Glucose, Bld: 263 mg/dL — ABNORMAL HIGH (ref 70–99)
Potassium: 3.7 mmol/L (ref 3.5–5.1)
Sodium: 140 mmol/L (ref 135–145)

## 2020-02-25 LAB — CBC WITH DIFFERENTIAL/PLATELET
Abs Immature Granulocytes: 0.01 10*3/uL (ref 0.00–0.07)
Basophils Absolute: 0 10*3/uL (ref 0.0–0.1)
Basophils Relative: 1 %
Eosinophils Absolute: 0.1 10*3/uL (ref 0.0–0.5)
Eosinophils Relative: 1 %
HCT: 39.2 % (ref 36.0–46.0)
Hemoglobin: 12.5 g/dL (ref 12.0–15.0)
Immature Granulocytes: 0 %
Lymphocytes Relative: 19 %
Lymphs Abs: 1.2 10*3/uL (ref 0.7–4.0)
MCH: 25.2 pg — ABNORMAL LOW (ref 26.0–34.0)
MCHC: 31.9 g/dL (ref 30.0–36.0)
MCV: 78.9 fL — ABNORMAL LOW (ref 80.0–100.0)
Monocytes Absolute: 0.4 10*3/uL (ref 0.1–1.0)
Monocytes Relative: 7 %
Neutro Abs: 4.3 10*3/uL (ref 1.7–7.7)
Neutrophils Relative %: 72 %
Platelets: 217 10*3/uL (ref 150–400)
RBC: 4.97 MIL/uL (ref 3.87–5.11)
RDW: 13.9 % (ref 11.5–15.5)
WBC: 6 10*3/uL (ref 4.0–10.5)
nRBC: 0 % (ref 0.0–0.2)

## 2020-02-25 LAB — RESPIRATORY PANEL BY RT PCR (FLU A&B, COVID)
Influenza A by PCR: NEGATIVE
Influenza B by PCR: NEGATIVE
SARS Coronavirus 2 by RT PCR: NEGATIVE

## 2020-02-25 LAB — I-STAT BETA HCG BLOOD, ED (MC, WL, AP ONLY): I-stat hCG, quantitative: <5 m[IU]/mL (ref <5)

## 2020-02-25 MED ORDER — METFORMIN HCL 500 MG PO TABS: 500.0000 mg | ORAL_TABLET | Freq: Two times a day (BID) | ORAL | 0 refills | Status: AC

## 2020-02-25 NOTE — ED Triage Notes (Signed)
Pt came from home via EMS. Had diarrhea and intermittent dizziness since yesterday. Daughter tested COVID positive 3 days ago, lives with her daughter. Has not had vomiting. Not really been eating the past 2 days. Had COVID in Feb, but pt states that this sickness feels different. Unvaccinated against COVID. Pt states her left ear hurts when she gets dizzy, ear pain present for the past 4 days. Left arm pain for a week.  BS: 291; Pt is diabetic but has not taken metformin for the past 2 weeks d/t financial issues. Has not had a chance to get in and see her doctor about it.  PMH of diabetes, asthma, anxiety, reflux, and vertigo

## 2020-02-25 NOTE — ED Provider Notes (Signed)
[place] DEPT Provider Note   CSN: [id number] Arrival date & time: 02/25/20  6222     History Chief Complaint  Patient presents with  . Abdominal Pain  . Shortness of Breath    [name] is a 34 y.o. female.  HPI   Patient states that this morning she started to feel short of breath.  Symptoms are not severe.  She also started having multiple episodes of diarrhea.  She has had some abdominal discomfort with that.  She has had some nausea but no vomiting.  Patient denies any fevers.  No cough.  She denies any chest pain.  She has had some pain intermittently in her left arm but that has been ongoing for 2 weeks and the symptoms all started this morning.  Patient's daughter was recently diagnosed with Covid.  Patient is not vaccinated  Past Medical History:  Diagnosis Date  . Asthma   . Diabetes mellitus without complication ([place])   . Obesity     There are no problems to display for this patient.   Past Surgical History:  Procedure Laterality Date  . CESAREAN SECTION    . TUBAL LIGATION       OB History   No obstetric history on file.     Family History  Problem Relation Age of Onset  . Diabetes Maternal Aunt     Social History   Tobacco Use  . Smoking status: Never Smoker  . Smokeless tobacco: Never Used  Vaping Use  . Vaping Use: Never used  Substance Use Topics  . Alcohol use: No  . Drug use: No    Home Medications Prior to Admission medications   Medication Sig Start Date End Date Taking? Authorizing Provider  acetaminophen (TYLENOL) 500 MG tablet Take 500 mg by mouth every 6 (six) hours as needed for mild pain.    [provider]  albuterol (PROVENTIL HFA;VENTOLIN HFA) 108 (90 Base) MCG/ACT inhaler Inhale 1-2 puffs into the lungs every 6 (six) hours as needed for wheezing or shortness of breath. 08/16/17   Doristine Devoid, PA-C  albuterol (PROVENTIL) (5 MG/ML) 0.5% nebulizer solution Take 0.5 mLs  (2.5 mg total) by nebulization every 6 (six) hours as needed for wheezing or shortness of breath. Patient not taking: Reported on 06/28/2018 08/16/17   Ocie Cornfield T, PA-C  benzonatate (TESSALON) 100 MG capsule Take 1 capsule (100 mg total) by mouth every 8 (eight) hours. 06/28/18   Nuala Alpha A, PA-C  Blood Glucose Monitoring Suppl (TRUE METRIX METER) w/Device KIT 1 each by Does not apply route 4 (four) times daily -  before meals and at bedtime. 12/09/15   Tresa Garter, MD  cyclobenzaprine (FLEXERIL) 10 MG tablet Take 1 tablet (10 mg total) by mouth 3 (three) times daily as needed for muscle spasms. Patient not taking: Reported on 06/28/2018 01/08/18   Jola Schmidt, MD  gabapentin (NEURONTIN) 300 MG capsule Take 300 mg by mouth at bedtime. 09/03/19   [provider]  glucose blood (TRUE METRIX BLOOD GLUCOSE TEST) test strip Use as instructed 12/09/15   Tresa Garter, MD  ibuprofen (ADVIL,MOTRIN) 400 MG tablet Take 1 tablet (400 mg total) by mouth every 6 (six) hours as needed. Patient not taking: Reported on 08/16/2017 08/08/17   Lacretia Leigh, MD  meloxicam (MOBIC) 15 MG tablet Take 15 mg by mouth daily as needed. 09/03/19   [provider]  metFORMIN (GLUCOPHAGE) 500 MG tablet Take 1 tablet (500 mg  total) by mouth 2 (two) times daily with a meal. 5/46/50 3/54/65  Delora Fuel, MD  methocarbamol (ROBAXIN) 500 MG tablet Take 1 tablet (500 mg total) by mouth 2 (two) times daily. 10/04/19   Suzy Bouchard, PA-C  methocarbamol (ROBAXIN-750) 750 MG tablet Take 1 tablet (750 mg total) by mouth 4 (four) times daily. Patient not taking: Reported on 08/16/2017 08/08/17   Lacretia Leigh, MD  naproxen (NAPROSYN) 500 MG tablet Take 1 tablet (500 mg total) by mouth 2 (two) times daily. 10/04/19   Suzy Bouchard, PA-C  ondansetron (ZOFRAN-ODT) 4 MG disintegrating tablet Take 1 tablet (4 mg total) by mouth every 8 (eight) hours as needed for nausea or vomiting. 11/24/19    Davonna Belling, MD  pantoprazole (PROTONIX) 20 MG tablet Take 1 tablet (20 mg total) by mouth daily. 10/04/19   Suzy Bouchard, PA-C  predniSONE (DELTASONE) 10 MG tablet Take 2 tablets (20 mg total) by mouth daily. 11/24/19   Davonna Belling, MD  TRUEPLUS LANCETS 28G MISC 1 each by Does not apply route 4 (four) times daily - after meals and at bedtime. 12/09/15   Tresa Garter, MD    Allergies    Other and Cefdinir  Review of Systems   Review of Systems  All other systems reviewed and are negative.   Physical Exam Updated Vital Signs BP 121/83 (BP Location: Left Arm)   Pulse 72   Temp 98.3 F (36.8 C) (Oral)   Resp 17   Ht 1.575 m (_0 )   Wt 78.5 kg   LMP 01/26/2020   SpO2 97%   BMI 31.64 kg/m   Physical Exam Vitals and nursing note reviewed.  Constitutional:      General: She is not in acute distress.    Appearance: She is well-developed.  HENT:     Head: Normocephalic and atraumatic.     Right Ear: External ear normal.     Left Ear: External ear normal.  Eyes:     General: No scleral icterus.       Right eye: No discharge.        Left eye: No discharge.     Conjunctiva/sclera: Conjunctivae normal.  Neck:     Trachea: No tracheal deviation.  Cardiovascular:     Rate and Rhythm: Normal rate and regular rhythm.  Pulmonary:     Effort: Pulmonary effort is normal. No respiratory distress.     Breath sounds: Normal breath sounds. No stridor. No wheezing or rales.  Abdominal:     General: Bowel sounds are normal. There is no distension.     Palpations: Abdomen is soft.     Tenderness: There is no abdominal tenderness. There is no guarding or rebound.  Musculoskeletal:        General: No tenderness.     Cervical back: Neck supple.  Skin:    General: Skin is warm and dry.     Findings: No rash.  Neurological:     Mental Status: She is alert.     Cranial Nerves: No cranial nerve deficit (no facial droop, extraocular movements intact, no slurred  speech).     Sensory: No sensory deficit.     Motor: No abnormal muscle tone or seizure activity.     Coordination: Coordination normal.     ED Results / Procedures / Treatments   Labs (all labs ordered are listed, but only abnormal results are displayed) Labs Reviewed  CBC WITH DIFFERENTIAL/PLATELET - Abnormal; Notable for the  following components:      Result Value   MCV 78.9 (*)    MCH 25.2 (*)    All other components within normal limits  BASIC METABOLIC PANEL - Abnormal; Notable for the following components:   Glucose, Bld 263 (*)    All other components within normal limits  RESPIRATORY PANEL BY RT PCR (FLU A&B, COVID)  I-STAT BETA HCG BLOOD, ED ([facility], [facility], [facility] ONLY)    EKG EKG Interpretation  Date/Time:  Wednesday February 25 2020 09:11:46 EDT Ventricular Rate:  69 PR Interval:    QRS Duration: 70 QT Interval:  387 QTC Calculation: 415 R Axis:   48 Text Interpretation: Sinus arrhythmia No significant change since last tracing Confirmed by Dorie Rank [phone]) on 02/25/2020 9:17:37 AM   Radiology DG Chest Portable 1 View  Result Date: 02/25/2020 CLINICAL DATA:  Diarrhea. Dizziness since yesterday. Family member with coronavirus infection. Shortness of breath. EXAM: PORTABLE CHEST 1 VIEW COMPARISON:  10/04/2019 FINDINGS: The heart size and mediastinal contours are within normal limits. Both lungs are clear. The visualized skeletal structures are unremarkable. IMPRESSION: No active disease. Electronically Signed   By: Nelson Chimes M.D.   On: 02/25/2020 09:27    Procedures Procedures (including critical care time)  Medications Ordered in ED Medications - No data to display  ED Course  I have reviewed the triage vital signs and the nursing notes.  Pertinent labs & imaging results that were available during my care of the patient were reviewed by me and considered in my medical decision making (see chart for details).  Clinical Course as of Feb 24 1101  Wed Feb 25, 2020  1053 Covid test is negative.  CBC and metabolic panel reassuring.  Glucose is elevated to 63 but patient does have a history of diabetes   [JK]    Clinical Course User Index [JK] Dorie Rank, MD   MDM Rules/Calculators/A&P                          Patient presented with complaints of shortness of breath as well as Covid.  ED work-up is reassuring.  No evidence of pneumonia.  Laboratory tests are unremarkable.  Covid test is negative.  Patient symptoms are early on.  It is possible that this may be a false negative.  Recommend recheck.  At this time there does not appear to be any evidence of an acute emergency medical condition and the patient appears stable for discharge with appropriate outpatient follow up.  Final Clinical Impression(s) / ED Diagnoses Final diagnoses:  Diarrhea, unspecified type    Rx / DC Orders ED Discharge Orders    None       Dorie Rank, MD 02/25/20 1103

## 2020-02-25 NOTE — Discharge Instructions (Addendum)
Take over-the-counter Imodium to help with the diarrhea.  You can take Zofran as needed for nausea.  Your Covid test was negative but early on it can be negative.  Follow-up with your doctor and have it rechecked in the next week if the symptoms persist.

## 2020-05-08 ENCOUNTER — Encounter (HOSPITAL_COMMUNITY): Payer: Self-pay | Admitting: *Deleted

## 2020-05-08 ENCOUNTER — Other Ambulatory Visit: Payer: Self-pay

## 2020-05-08 ENCOUNTER — Emergency Department (HOSPITAL_COMMUNITY)
Admission: EM | Admit: 2020-05-08 | Discharge: 2020-05-08 | Disposition: A | Discharge status: Home / Self Care | Payer: HRSA Program | Attending: Emergency Medicine | Admitting: Emergency Medicine

## 2020-05-08 DIAGNOSIS — U071 COVID-19: Secondary | ICD-10-CM | POA: Insufficient documentation

## 2020-05-08 DIAGNOSIS — B349 Viral infection, unspecified: Secondary | ICD-10-CM | POA: Diagnosis not present

## 2020-05-08 DIAGNOSIS — R0602 Shortness of breath: Secondary | ICD-10-CM | POA: Diagnosis present

## 2020-05-08 DIAGNOSIS — Z7984 Long term (current) use of oral hypoglycemic drugs: Secondary | ICD-10-CM | POA: Insufficient documentation

## 2020-05-08 DIAGNOSIS — J45909 Unspecified asthma, uncomplicated: Secondary | ICD-10-CM | POA: Insufficient documentation

## 2020-05-08 DIAGNOSIS — Z20822 Contact with and (suspected) exposure to covid-19: Secondary | ICD-10-CM

## 2020-05-08 DIAGNOSIS — E669 Obesity, unspecified: Secondary | ICD-10-CM | POA: Diagnosis not present

## 2020-05-08 DIAGNOSIS — Z79899 Other long term (current) drug therapy: Secondary | ICD-10-CM | POA: Diagnosis not present

## 2020-05-08 DIAGNOSIS — E1169 Type 2 diabetes mellitus with other specified complication: Secondary | ICD-10-CM | POA: Diagnosis not present

## 2020-05-08 DIAGNOSIS — R0981 Nasal congestion: Secondary | ICD-10-CM

## 2020-05-08 LAB — RESP PANEL BY RT-PCR (FLU A&B, COVID) ARPGX2
Influenza A by PCR: NEGATIVE
Influenza B by PCR: NEGATIVE
SARS Coronavirus 2 by RT PCR: POSITIVE — AB

## 2020-05-08 MED ORDER — PROMETHAZINE-DM 6.25-15 MG/5ML PO SYRP
5.0000 mL | ORAL_SOLUTION | Freq: Four times a day (QID) | ORAL | 0 refills | Status: DC | PRN
Start: 2020-05-08 — End: 2021-06-06

## 2020-05-08 MED ORDER — FLUTICASONE PROPIONATE 50 MCG/ACT NA SUSP
2.0000 | Freq: Every day | NASAL | 0 refills | Status: DC
Start: 2020-05-08 — End: 2021-07-27

## 2020-05-08 MED ORDER — BENZONATATE 100 MG PO CAPS
100.0000 mg | ORAL_CAPSULE | Freq: Three times a day (TID) | ORAL | 0 refills | Status: DC
Start: 2020-05-08 — End: 2021-06-06

## 2020-05-08 MED ORDER — ONDANSETRON 4 MG PO TBDP
4.0000 mg | ORAL_TABLET | Freq: Three times a day (TID) | ORAL | 0 refills | Status: DC | PRN
Start: 2020-05-08 — End: 2020-11-03

## 2020-05-08 MED ORDER — AMOXICILLIN-POT CLAVULANATE 875-125 MG PO TABS
1.0000 | ORAL_TABLET | Freq: Two times a day (BID) | ORAL | 0 refills | Status: AC
Start: 2020-05-08 — End: 2020-05-13

## 2020-05-08 NOTE — ED Triage Notes (Signed)
Pt complains of RUQ pain and back pain x 2 days, worse with breathing. She states she has had productive cough. She also reports sinus pressure for the past 4 days.

## 2020-05-08 NOTE — Discharge Instructions (Addendum)
Your COVID test is pending; you should expect results in 2-6 hours.   If your sinus pressure and congestion does not improve (AND YOUR COVID IS NEGATIVE) you may start taking augmentin twice daily.   You can access your results on your MyChart--if you test positive you should receive a phone call.  In the meantime follow CDC guidelines and quarantine, wear a mask, wash hands often.   Please take over the counter vitamin D 2000-4000 units per day. I also recommend zinc 50 mg per day for the next two weeks.   Please return to ED if you feel have difficulty breathing or have emergent, new or concerning symptoms.  Patients who have symptoms consistent with COVID-19 should self isolated for: At least 3 days (72 hours) have passed since recovery, defined as resolution of fever without the use of fever reducing medications and improvement in respiratory symptoms (e.g., cough, shortness of breath), and At least 7 days have passed since symptoms first appeared.       Person Under Monitoring Name: [name]  Location: [place] [address]   Infection Prevention Recommendations for Individuals Confirmed to have, or Being Evaluated for, 2019 Novel Coronavirus (COVID-19) Infection Who Receive Care at Home  Individuals who are confirmed to have, or are being evaluated for, COVID-19 should follow the prevention steps below until a healthcare provider or local or state health department says they can return to normal activities.  Stay home except to get medical care You should restrict activities outside your home, except for getting medical care. Do not go to work, school, or public areas, and do not use public transportation or taxis.  Call ahead before visiting your doctor Before your medical appointment, call the healthcare provider and tell them that you have, or are being evaluated for, COVID-19 infection. This will help the healthcare provider's office take  steps to keep other people from getting infected. Ask your healthcare provider to call the local or state health department.  Monitor your symptoms Seek prompt medical attention if your illness is worsening (e.g., difficulty breathing). Before going to your medical appointment, call the healthcare provider and tell them that you have, or are being evaluated for, COVID-19 infection. Ask your healthcare provider to call the local or state health department.  Wear a facemask You should wear a facemask that covers your nose and mouth when you are in the same room with other people and when you visit a healthcare provider. People who live with or visit you should also wear a facemask while they are in the same room with you.  Separate yourself from other people in your home As much as possible, you should stay in a different room from other people in your home. Also, you should use a separate bathroom, if available.  Avoid sharing household items You should not share dishes, drinking glasses, cups, eating utensils, towels, bedding, or other items with other people in your home. After using these items, you should wash them thoroughly with soap and water.  Cover your coughs and sneezes Cover your mouth and nose with a tissue when you cough or sneeze, or you can cough or sneeze into your sleeve. Throw used tissues in a lined trash can, and immediately wash your hands with soap and water for at least 20 seconds or use an alcohol-based hand rub.  Wash your [facility] your hands often and thoroughly with soap and water for at least 20 seconds. You can  use an alcohol-based hand sanitizer if soap and water are not available and if your hands are not visibly dirty. Avoid touching your eyes, nose, and mouth with unwashed hands.   Prevention Steps for Caregivers and Household Members of Individuals Confirmed to have, or Being Evaluated for, COVID-19 Infection Being Cared for in the Home  If you  live with, or provide care at home for, a person confirmed to have, or being evaluated for, COVID-19 infection please follow these guidelines to prevent infection:  Follow healthcare provider's instructions Make sure that you understand and can help the patient follow any healthcare provider instructions for all care.  Provide for the patient's basic needs You should help the patient with basic needs in the home and provide support for getting groceries, prescriptions, and other personal needs.  Monitor the patient's symptoms If they are getting sicker, call his or her medical provider and tell them that the patient has, or is being evaluated for, COVID-19 infection. This will help the healthcare provider's office take steps to keep other people from getting infected. Ask the healthcare provider to call the local or state health department.  Limit the number of people who have contact with the patient If possible, have only one caregiver for the patient. Other household members should stay in another home or place of residence. If this is not possible, they should stay in another room, or be separated from the patient as much as possible. Use a separate bathroom, if available. Restrict visitors who do not have an essential need to be in the home.  Keep older adults, very young children, and other sick people away from the patient Keep older adults, very young children, and those who have compromised immune systems or chronic health conditions away from the patient. This includes people with chronic heart, lung, or kidney conditions, diabetes, and cancer.  Ensure good ventilation Make sure that shared spaces in the home have good air flow, such as from an air conditioner or an opened window, weather permitting.  Wash your hands often Wash your hands often and thoroughly with soap and water for at least 20 seconds. You can use an alcohol based hand sanitizer if soap and water are not  available and if your hands are not visibly dirty. Avoid touching your eyes, nose, and mouth with unwashed hands. Use disposable paper towels to dry your hands. If not available, use dedicated cloth towels and replace them when they become wet.  Wear a facemask and gloves Wear a disposable facemask at all times in the room and gloves when you touch or have contact with the patient's blood, body fluids, and/or secretions or excretions, such as sweat, saliva, sputum, nasal mucus, vomit, urine, or feces.  Ensure the mask fits over your nose and mouth tightly, and do not touch it during use. Throw out disposable facemasks and gloves after using them. Do not reuse. Wash your hands immediately after removing your facemask and gloves. If your personal clothing becomes contaminated, carefully remove clothing and launder. Wash your hands after handling contaminated clothing. Place all used disposable facemasks, gloves, and other waste in a lined container before disposing them with other household waste. Remove gloves and wash your hands immediately after handling these items.  Do not share dishes, glasses, or other household items with the patient Avoid sharing household items. You should not share dishes, drinking glasses, cups, eating utensils, towels, bedding, or other items with a patient who is confirmed to have, or being evaluated  for, COVID-19 infection. After the person uses these items, you should wash them thoroughly with soap and water.  Wash laundry thoroughly Immediately remove and wash clothes or bedding that have blood, body fluids, and/or secretions or excretions, such as sweat, saliva, sputum, nasal mucus, vomit, urine, or feces, on them. Wear gloves when handling laundry from the patient. Read and follow directions on labels of laundry or clothing items and detergent. In general, wash and dry with the warmest temperatures recommended on the label.  Clean all areas the individual has  used often Clean all touchable surfaces, such as counters, tabletops, doorknobs, bathroom fixtures, toilets, phones, keyboards, tablets, and bedside tables, every day. Also, clean any surfaces that may have blood, body fluids, and/or secretions or excretions on them. Wear gloves when cleaning surfaces the patient has come in contact with. Use a diluted bleach solution (e.g., dilute bleach with 1 part bleach and 10 parts water) or a household disinfectant with a label that says EPA-registered for coronaviruses. To make a bleach solution at home, add 1 tablespoon of bleach to 1 quart (4 cups) of water. For a larger supply, add  cup of bleach to 1 gallon (16 cups) of water. Read labels of cleaning products and follow recommendations provided on product labels. Labels contain instructions for safe and effective use of the cleaning product including precautions you should take when applying the product, such as wearing gloves or eye protection and making sure you have good ventilation during use of the product. Remove gloves and wash hands immediately after cleaning.  Monitor yourself for signs and symptoms of illness Caregivers and household members are considered close contacts, should monitor their health, and will be asked to limit movement outside of the home to the extent possible. Follow the monitoring steps for close contacts listed on the symptom monitoring form.   ? If you have additional questions, contact your local health department or call the epidemiologist on call at [phone] (available 24/7). ? This guidance is subject to change. For the most up-to-date guidance from [facility], please refer to their website: [email or web address]

## 2020-05-08 NOTE — ED Provider Notes (Signed)
[facility] DEPT Provider Note   CSN: [id number] Arrival date & time: 05/08/20  0813     History Chief Complaint  Patient presents with  . Shortness of Breath    [name] is a 35 y.o. female history of asthma and diabetes and obesity  HPI Patient is presented today with 5 days of cough, congestion, sore throat, abdominal cramps, nausea, fatigue and malaise.  She denies any measured fevers.  She does endorse chills.  She states that she is not vaccinated for Covid.  She has a past medical history of obesity, DM, asthma.  She denies any shortness of breath or chest pain.  She also endorses severe sinus pressure and congestion.  She has not taken any medications for her sinus symptoms other than Mucinex.  She is coughing primarily yellow-brown sputum.  Scant hemoptysis occasionally with coughing.  No other associated symptoms.  No aggravating or mitigating factors.     Past Medical History:  Diagnosis Date  . Asthma   . Diabetes mellitus without complication ([place])   . Obesity     There are no problems to display for this patient.   Past Surgical History:  Procedure Laterality Date  . CESAREAN SECTION    . TUBAL LIGATION       OB History   No obstetric history on file.     Family History  Problem Relation Age of Onset  . Diabetes Maternal Aunt     Social History   Tobacco Use  . Smoking status: Never Smoker  . Smokeless tobacco: Never Used  Vaping Use  . Vaping Use: Never used  Substance Use Topics  . Alcohol use: No  . Drug use: No    Home Medications Prior to Admission medications   Medication Sig Start Date End Date Taking? Authorizing Provider  amoxicillin-clavulanate (AUGMENTIN) 875-125 MG tablet Take 1 tablet by mouth every 12 (twelve) hours for 5 days. 05/08/20 05/13/20 Yes Charl Wellen S, PA  benzonatate (TESSALON) 100 MG capsule Take 1 capsule (100 mg total) by mouth every 8 (eight) hours. 05/08/20  Yes Jessia Kief,  Cannan Beeck S, PA  fluticasone (FLONASE) 50 MCG/ACT nasal spray Place 2 sprays into both nostrils daily for 14 days. 05/08/20 05/22/20 Yes Priscille Shadduck S, PA  ondansetron (ZOFRAN ODT) 4 MG disintegrating tablet Take 1 tablet (4 mg total) by mouth every 8 (eight) hours as needed for nausea or vomiting. 05/08/20  Yes Hatsue Sime S, PA  promethazine-dextromethorphan (PROMETHAZINE-DM) 6.25-15 MG/5ML syrup Take 5 mLs by mouth 4 (four) times daily as needed for cough. 05/08/20  Yes Janey Petron, Ova Freshwater S, PA  acetaminophen (TYLENOL) 500 MG tablet Take 500 mg by mouth every 6 (six) hours as needed for mild pain.    [provider]  albuterol (PROVENTIL HFA;VENTOLIN HFA) 108 (90 Base) MCG/ACT inhaler Inhale 1-2 puffs into the lungs every 6 (six) hours as needed for wheezing or shortness of breath. 08/16/17   Doristine Devoid, PA-C  albuterol (PROVENTIL) (5 MG/ML) 0.5% nebulizer solution Take 0.5 mLs (2.5 mg total) by nebulization every 6 (six) hours as needed for wheezing or shortness of breath. Patient not taking: Reported on 06/28/2018 08/16/17   Ocie Cornfield T, PA-C  Blood Glucose Monitoring Suppl (TRUE METRIX METER) w/Device KIT 1 each by Does not apply route 4 (four) times daily -  before meals and at bedtime. 12/09/15   Tresa Garter, MD  cyclobenzaprine (FLEXERIL) 10 MG tablet Take 1 tablet (10 mg total) by mouth  3 (three) times daily as needed for muscle spasms. Patient not taking: Reported on 06/28/2018 01/08/18   Jola Schmidt, MD  gabapentin (NEURONTIN) 300 MG capsule Take 300 mg by mouth at bedtime. 09/03/19   [provider]  glucose blood (TRUE METRIX BLOOD GLUCOSE TEST) test strip Use as instructed 12/09/15   Tresa Garter, MD  ibuprofen (ADVIL,MOTRIN) 400 MG tablet Take 1 tablet (400 mg total) by mouth every 6 (six) hours as needed. Patient not taking: Reported on 08/16/2017 08/08/17   Lacretia Leigh, MD  meloxicam (MOBIC) 15 MG tablet Take 15 mg by mouth daily as needed. 09/03/19    [provider]  metFORMIN (GLUCOPHAGE) 500 MG tablet Take 1 tablet (500 mg total) by mouth 2 (two) times daily with a meal. 02/25/20 03/26/20  Dorie Rank, MD  methocarbamol (ROBAXIN) 500 MG tablet Take 1 tablet (500 mg total) by mouth 2 (two) times daily. 10/04/19   Suzy Bouchard, PA-C  methocarbamol (ROBAXIN-750) 750 MG tablet Take 1 tablet (750 mg total) by mouth 4 (four) times daily. Patient not taking: Reported on 08/16/2017 08/08/17   Lacretia Leigh, MD  naproxen (NAPROSYN) 500 MG tablet Take 1 tablet (500 mg total) by mouth 2 (two) times daily. 10/04/19   Suzy Bouchard, PA-C  pantoprazole (PROTONIX) 20 MG tablet Take 1 tablet (20 mg total) by mouth daily. 10/04/19   Suzy Bouchard, PA-C  predniSONE (DELTASONE) 10 MG tablet Take 2 tablets (20 mg total) by mouth daily. 11/24/19   Davonna Belling, MD  TRUEPLUS LANCETS 28G MISC 1 each by Does not apply route 4 (four) times daily - after meals and at bedtime. 12/09/15   Tresa Garter, MD    Allergies    Other and Cefdinir  Review of Systems   Review of Systems  Constitutional: Positive for chills and fatigue. Negative for fever.  HENT: Positive for congestion, sinus pressure, sinus pain and sore throat.   Respiratory: Positive for cough. Negative for shortness of breath.   Cardiovascular: Negative for chest pain.  Gastrointestinal: Negative for abdominal distention.  Neurological: Negative for dizziness and headaches.    Physical Exam Updated Vital Signs BP (!) 144/83 (BP Location: Right Arm)   Pulse 90   Temp 98.4 F (36.9 C) (Oral)   Resp 18   LMP 04/21/2020   SpO2 100%   Physical Exam Vitals and nursing note reviewed.  Constitutional:      General: She is not in acute distress.    Appearance: She is obese.     Comments: Pleasant well-appearing 35 year old.  In no acute distress.  Sitting comfortably in bed.  Able answer questions appropriately follow commands. No increased work of breathing.  Speaking in full sentences.  HENT:     Head: Normocephalic and atraumatic.     Comments: Some maxillary and frontal sinus tenderness to palpation    Nose: Nose normal.     Mouth/Throat:     Mouth: Mucous membranes are moist.  Eyes:     General: No scleral icterus. Cardiovascular:     Rate and Rhythm: Normal rate and regular rhythm.     Pulses: Normal pulses.     Heart sounds: Normal heart sounds.  Pulmonary:     Effort: Pulmonary effort is normal. No respiratory distress.     Breath sounds: No wheezing.     Comments: No tachypnea or increased work of breathing.  Lung sounds are clear in all fields. Abdominal:     Palpations: Abdomen is  soft.     Tenderness: There is no abdominal tenderness. There is no guarding or rebound.  Musculoskeletal:     Cervical back: Normal range of motion.     Right lower leg: No edema.     Left lower leg: No edema.  Skin:    General: Skin is warm and dry.     Capillary Refill: Capillary refill takes less than 2 seconds.  Neurological:     Mental Status: She is alert. Mental status is at baseline.  Psychiatric:        Mood and Affect: Mood normal.        Behavior: Behavior normal.     ED Results / Procedures / Treatments   Labs (all labs ordered are listed, but only abnormal results are displayed) Labs Reviewed  RESP PANEL BY RT-PCR (FLU A&B, COVID) [id number]    EKG None  Radiology No results found.  Procedures Procedures (including critical care time)  Medications Ordered in ED Medications - No data to display  ED Course  I have reviewed the triage vital signs and the nursing notes.  Pertinent labs & imaging results that were available during my care of the patient were reviewed by me and considered in my medical decision making (see chart for details).    MDM Rules/Calculators/A&P                          Patient is a 35 year old female presented today with symptoms consistent with COVID-19.  See HPI for full  details.  Physical exam is notable for dyspnea, cough, sinus pressure.  Patient has remained 100% SPO2 on room air.  She does not appear overly dyspneic.  Her condition seems to be more from sinus pressure and congestion.  She does have some maxillary facial tenderness as well as frontal sinus tenderness to palpation.  Her symptoms have been ongoing for 4-5 days however she has had some subjective fevers at home.  Patient is being discharged prior to results of Covid test.  If Covid test is negative she will fill the written prescription for Augmentin we will treat as a bacterial sinus infection.  If Covid test is positive she will do off on this and use Flonase, antitussives, other medications prescribed below.  Patient discharge at this time however if positive will follow up with [facility] will follow up on the test results.  Patient is Covid test was positive.  Reach out to [facility].  May or may not qualify given shortages.  Final Clinical Impression(s) / ED Diagnoses Final diagnoses:  Viral illness  Suspected COVID-19 virus infection  Sinus congestion    Rx / DC Orders ED Discharge Orders         Ordered    benzonatate (TESSALON) 100 MG capsule  Every 8 hours        05/08/20 1258    promethazine-dextromethorphan (PROMETHAZINE-DM) 6.25-15 MG/5ML syrup  4 times daily PRN        05/08/20 1258    ondansetron (ZOFRAN ODT) 4 MG disintegrating tablet  Every 8 hours PRN        05/08/20 1258    fluticasone (FLONASE) 50 MCG/ACT nasal spray  Daily        05/08/20 1258    amoxicillin-clavulanate (AUGMENTIN) 875-125 MG tablet  Every 12 hours        05/08/20 1258           Pati Gallo Irene, [place] 05/09/20  2197    Pattricia Boss, MD 05/09/20 1947

## 2020-05-14 ENCOUNTER — Telehealth: Payer: Self-pay | Admitting: Nurse Practitioner

## 2020-05-14 NOTE — Telephone Encounter (Signed)
[facility] [phone]) called pt for HFU. Verified DOB. Pt reports feeling much better with no symptoms. Pt states she will call if symptoms reocurr.

## 2020-07-22 ENCOUNTER — Emergency Department (HOSPITAL_COMMUNITY): Payer: Self-pay

## 2020-07-22 ENCOUNTER — Emergency Department (HOSPITAL_COMMUNITY)
Admission: EM | Admit: 2020-07-22 | Discharge: 2020-07-22 | Disposition: A | Discharge status: Home / Self Care | Payer: Self-pay | Attending: Emergency Medicine | Admitting: Emergency Medicine

## 2020-07-22 DIAGNOSIS — E1165 Type 2 diabetes mellitus with hyperglycemia: Secondary | ICD-10-CM | POA: Insufficient documentation

## 2020-07-22 DIAGNOSIS — R5383 Other fatigue: Secondary | ICD-10-CM | POA: Insufficient documentation

## 2020-07-22 DIAGNOSIS — J45909 Unspecified asthma, uncomplicated: Secondary | ICD-10-CM | POA: Insufficient documentation

## 2020-07-22 DIAGNOSIS — R059 Cough, unspecified: Secondary | ICD-10-CM | POA: Insufficient documentation

## 2020-07-22 DIAGNOSIS — M549 Dorsalgia, unspecified: Secondary | ICD-10-CM | POA: Insufficient documentation

## 2020-07-22 DIAGNOSIS — Z7984 Long term (current) use of oral hypoglycemic drugs: Secondary | ICD-10-CM | POA: Insufficient documentation

## 2020-07-22 DIAGNOSIS — R739 Hyperglycemia, unspecified: Secondary | ICD-10-CM

## 2020-07-22 DIAGNOSIS — Z7951 Long term (current) use of inhaled steroids: Secondary | ICD-10-CM | POA: Insufficient documentation

## 2020-07-22 DIAGNOSIS — R1012 Left upper quadrant pain: Secondary | ICD-10-CM | POA: Insufficient documentation

## 2020-07-22 DIAGNOSIS — R072 Precordial pain: Secondary | ICD-10-CM | POA: Insufficient documentation

## 2020-07-22 DIAGNOSIS — R079 Chest pain, unspecified: Secondary | ICD-10-CM

## 2020-07-22 LAB — BASIC METABOLIC PANEL
Anion gap: 8 (ref 5–15)
BUN: 11 mg/dL (ref 6–20)
CO2: 25 mmol/L (ref 22–32)
Calcium: 9 mg/dL (ref 8.9–10.3)
Chloride: 101 mmol/L (ref 98–111)
Creatinine, Ser: 0.83 mg/dL (ref 0.44–1.00)
GFR, Estimated: >60 mL/min (ref >60)
Glucose, Bld: 417 mg/dL — ABNORMAL HIGH (ref 70–99)
Potassium: 4 mmol/L (ref 3.5–5.1)
Sodium: 134 mmol/L — ABNORMAL LOW (ref 135–145)

## 2020-07-22 LAB — HEPATIC FUNCTION PANEL
ALT: 12 U/L (ref 0–44)
AST: 15 U/L (ref 15–41)
Albumin: 3.4 g/dL — ABNORMAL LOW (ref 3.5–5.0)
Alkaline Phosphatase: 68 U/L (ref 38–126)
Bilirubin, Direct: 0.1 mg/dL (ref 0.0–0.2)
Indirect Bilirubin: 0.8 mg/dL (ref 0.3–0.9)
Total Bilirubin: 0.9 mg/dL (ref 0.3–1.2)
Total Protein: 6.6 g/dL (ref 6.5–8.1)

## 2020-07-22 LAB — I-STAT BETA HCG BLOOD, ED (MC, WL, AP ONLY): I-stat hCG, quantitative: <5 m[IU]/mL (ref <5)

## 2020-07-22 LAB — CBC
HCT: 37.3 % (ref 36.0–46.0)
Hemoglobin: 12.2 g/dL (ref 12.0–15.0)
MCH: 25.7 pg — ABNORMAL LOW (ref 26.0–34.0)
MCHC: 32.7 g/dL (ref 30.0–36.0)
MCV: 78.5 fL — ABNORMAL LOW (ref 80.0–100.0)
Platelets: 229 10*3/uL (ref 150–400)
RBC: 4.75 MIL/uL (ref 3.87–5.11)
RDW: 13.6 % (ref 11.5–15.5)
WBC: 4.8 10*3/uL (ref 4.0–10.5)
nRBC: 0 % (ref 0.0–0.2)

## 2020-07-22 LAB — LIPASE, BLOOD: Lipase: 53 U/L — ABNORMAL HIGH (ref 11–51)

## 2020-07-22 LAB — TROPONIN I (HIGH SENSITIVITY)
Troponin I (High Sensitivity): <2 ng/L (ref <18)
Troponin I (High Sensitivity): <2 ng/L (ref <18)

## 2020-07-22 LAB — CBG MONITORING, ED: Glucose-Capillary: 211 mg/dL — ABNORMAL HIGH (ref 70–99)

## 2020-07-22 MED ORDER — SODIUM CHLORIDE 0.9 % IV BOLUS
1000.0000 mL | Freq: Once | INTRAVENOUS | Status: AC
  Administered 2020-07-22: 1000 mL via INTRAVENOUS

## 2020-07-22 MED ORDER — METHOCARBAMOL 500 MG PO TABS: 500.0000 mg | ORAL_TABLET | Freq: Three times a day (TID) | ORAL | 0 refills | Status: DC | PRN

## 2020-07-22 NOTE — ED Notes (Signed)
Pt transported to [facility]

## 2020-07-22 NOTE — ED Provider Notes (Signed)
[facility] EMERGENCY DEPARTMENT Provider Note   CSN: [id number] Arrival date & time: 07/22/20  1131     History Chief Complaint  Patient presents with  . Chest Pain    [name] is a 35 y.o. female.  HPI Patient presents with chest pain.  Has had for around the last week.  Left chest and retrosternal area.  Does go to the back some.  Also abdominal pain.  States it does feel worse after eating.  Potentially worse with certain positions.  No trauma.  Has been there for around a week but more constant today.  Not exertional.  No fevers. No nausea or vomiting.  No diarrhea.  States she has not had pains like this before.  No production but has an occasional cough.  Patient is diabetic.  Has bad a little fatigue last few days.  Sugar 400 upon arrival but has not checked this morning or released yesterday.    Past Medical History:  Diagnosis Date  . Asthma   . Diabetes mellitus without complication ([place])   . Obesity     There are no problems to display for this patient.   Past Surgical History:  Procedure Laterality Date  . CESAREAN SECTION    . TUBAL LIGATION       OB History   No obstetric history on file.     Family History  Problem Relation Age of Onset  . Diabetes Maternal Aunt     Social History   Tobacco Use  . Smoking status: Never Smoker  . Smokeless tobacco: Never Used  Vaping Use  . Vaping Use: Never used  Substance Use Topics  . Alcohol use: No  . Drug use: No    Home Medications Prior to Admission medications   Medication Sig Start Date End Date Taking? Authorizing Provider  acetaminophen (TYLENOL) 500 MG tablet Take 1,000 mg by mouth every 6 (six) hours as needed for mild pain.   Yes [provider]  albuterol (PROVENTIL HFA;VENTOLIN HFA) 108 (90 Base) MCG/ACT inhaler Inhale 1-2 puffs into the lungs every 6 (six) hours as needed for wheezing or shortness of breath. Patient taking differently: Inhale 2 puffs  into the lungs every 6 (six) hours as needed for wheezing or shortness of breath. 08/16/17  Yes Leaphart, Chrissie Noa T, PA-C  albuterol (PROVENTIL) (5 MG/ML) 0.5% nebulizer solution Take 0.5 mLs (2.5 mg total) by nebulization every 6 (six) hours as needed for wheezing or shortness of breath. 08/16/17  Yes Leaphart, Zack Seal, PA-C  calcium carbonate (TUMS - DOSED IN MG ELEMENTAL CALCIUM) 500 MG chewable tablet Chew 1 tablet by mouth daily.   Yes [provider]  fluticasone (FLONASE) 50 MCG/ACT nasal spray Place 2 sprays into both nostrils daily for 14 days. Patient taking differently: Place 2 sprays into both nostrils daily as needed for allergies. 05/08/20 05/22/20 Yes Fondaw, Kathleene Hazel, PA  metFORMIN (GLUCOPHAGE) 500 MG tablet Take 1 tablet (500 mg total) by mouth 2 (two) times daily with a meal. 02/25/20 03/26/20 Yes Dorie Rank, MD  Multiple Vitamin (MULTIVITAMIN ADULT PO) Take 1 tablet by mouth daily.   Yes [provider]  ondansetron (ZOFRAN ODT) 4 MG disintegrating tablet Take 1 tablet (4 mg total) by mouth every 8 (eight) hours as needed for nausea or vomiting. 05/08/20  Yes Fondaw, Wylder S, PA  benzonatate (TESSALON) 100 MG capsule Take 1 capsule (100 mg total) by mouth every 8 (eight) hours. Patient not taking: No sig  reported 05/08/20   Tedd Sias, PA  Blood Glucose Monitoring Suppl (TRUE METRIX METER) w/Device KIT 1 each by Does not apply route 4 (four) times daily -  before meals and at bedtime. 12/09/15   Tresa Garter, MD  glucose blood (TRUE METRIX BLOOD GLUCOSE TEST) test strip Use as instructed 12/09/15   Tresa Garter, MD  ibuprofen (ADVIL,MOTRIN) 400 MG tablet Take 1 tablet (400 mg total) by mouth every 6 (six) hours as needed. 08/08/17   Lacretia Leigh, MD  methocarbamol (ROBAXIN) 500 MG tablet Take 1 tablet (500 mg total) by mouth every 8 (eight) hours as needed for muscle spasms. 07/22/20   Davonna Belling, MD  naproxen (NAPROSYN) 500 MG tablet Take 1 tablet  (500 mg total) by mouth 2 (two) times daily. Patient not taking: No sig reported 10/04/19   Suzy Bouchard, PA-C  pantoprazole (PROTONIX) 20 MG tablet Take 1 tablet (20 mg total) by mouth daily. Patient not taking: No sig reported 10/04/19   Suzy Bouchard, PA-C  promethazine-dextromethorphan (PROMETHAZINE-DM) 6.25-15 MG/5ML syrup Take 5 mLs by mouth 4 (four) times daily as needed for cough. Patient not taking: No sig reported 05/08/20   Tedd Sias, PA  TRUEPLUS LANCETS 28G MISC 1 each by Does not apply route 4 (four) times daily - after meals and at bedtime. 12/09/15   Tresa Garter, MD    Allergies    Other, Shellfish-derived products, and Cefdinir  Review of Systems   Review of Systems  Constitutional: Negative for appetite change.  HENT: Negative for congestion.   Respiratory: Positive for cough. Negative for shortness of breath.   Cardiovascular: Positive for chest pain.  Gastrointestinal: Positive for abdominal pain. Negative for constipation, diarrhea and vomiting.  Endocrine: Negative for polyuria.  Genitourinary: Negative for flank pain.  Musculoskeletal: Positive for back pain.  Skin: Negative for rash.  Neurological: Negative for weakness.  Psychiatric/Behavioral: Negative for confusion.    Physical Exam Updated Vital Signs BP 115/67   Pulse 79   Temp (!) 97.5 F (36.4 C) (Oral)   Resp 12   LMP 07/14/2020   SpO2 100%   Physical Exam  ED Results / Procedures / Treatments   Labs (all labs ordered are listed, but only abnormal results are displayed) Labs Reviewed  BASIC METABOLIC PANEL - Abnormal; Notable for the following components:      Result Value   Sodium 134 (*)    Glucose, Bld 417 (*)    All other components within normal limits  CBC - Abnormal; Notable for the following components:   MCV 78.5 (*)    MCH 25.7 (*)    All other components within normal limits  HEPATIC FUNCTION PANEL - Abnormal; Notable for the following components:    Albumin 3.4 (*)    All other components within normal limits  LIPASE, BLOOD - Abnormal; Notable for the following components:   Lipase 53 (*)    All other components within normal limits  CBG MONITORING, ED - Abnormal; Notable for the following components:   Glucose-Capillary 211 (*)    All other components within normal limits  I-STAT BETA HCG BLOOD, ED ([facility], [facility], [facility] ONLY)  TROPONIN I (HIGH SENSITIVITY)  TROPONIN I (HIGH SENSITIVITY)    EKG EKG Interpretation  Date/Time:  Thursday July 22 2020 11:36:23 EDT Ventricular Rate:  79 PR Interval:  156 QRS Duration: 76 QT Interval:  356 QTC Calculation: 408 R Axis:   48 Text Interpretation: Normal sinus  rhythm with sinus arrhythmia Normal ECG Confirmed by Davonna Belling [phone]) on 07/22/2020 4:06:23 PM   Radiology DG Chest 2 View  Result Date: 07/22/2020 CLINICAL DATA:  Chest pain EXAM: CHEST - 2 VIEW COMPARISON:  None. FINDINGS: The heart size and mediastinal contours are within normal limits. Both lungs are clear. The visualized skeletal structures are unremarkable. IMPRESSION: Normal study. Electronically Signed   By: Rolm Baptise M.D.   On: 07/22/2020 12:03   US Abdomen Limited  Result Date: 07/22/2020 CLINICAL DATA:  Right upper quadrant pain x2 weeks. EXAM: ULTRASOUND ABDOMEN LIMITED RIGHT UPPER QUADRANT COMPARISON:  None. FINDINGS: Gallbladder: No gallstones or wall thickening visualized (1.4 mm). No sonographic Murphy sign noted by sonographer. Common bile duct: Diameter: Poorly visualized secondary to overlying bowel gas. Measures approximately 5 mm at the level of the pancreatic head. Liver: No focal lesion identified. There is diffusely increased echogenicity of the liver parenchyma. Intrahepatic biliary dilatation is seen. Portal vein is patent on color Doppler imaging with normal direction of blood flow towards the liver. Other: Limited study secondary to the patient's body habitus and overlying bowel gas. IMPRESSION: 1.  Fatty liver with intrahepatic biliary dilatation. MRI correlation is recommended. 2. No evidence of cholelithiasis. Electronically Signed   By: Virgina Norfolk M.D.   On: 07/22/2020 17:16    Procedures Procedures   Medications Ordered in ED Medications  sodium chloride 0.9 % bolus 1,000 mL (0 mLs Intravenous Stopped 07/22/20 1932)    ED Course  I have reviewed the triage vital signs and the nursing notes.  Pertinent labs & imaging results that were available during my care of the patient were reviewed by me and considered in my medical decision making (see chart for details).    MDM Rules/Calculators/A&P                          Patient presents with chest pain.  Nonspecific.  Anterior chest.  Although does have potential component with eating.  Some radiation to back at times.  EKG reassuring.  Troponin reassuring.  Lipase just minimally elevated.  However found to have sugar 400.  Has not been checking her sugars at home.  Unsure of compliance with medications.  Not in DKA and improved with treatment. However ultrasound done due to upper abdominal pain that was getting worse with food.  Showed some intrahepatic ductal dilatation.  However lab work is reassuring.  I think patient is stable for outpatient follow-up with GI for further work-up of this.  Do not feel as if she needs admission to the hospital at this time.  Will discharge home. Final Clinical Impression(s) / ED Diagnoses Final diagnoses:  Nonspecific chest pain  Left upper quadrant abdominal pain  Hyperglycemia    Rx / DC Orders ED Discharge Orders         Ordered    methocarbamol (ROBAXIN) 500 MG tablet  Every 8 hours PRN        07/22/20 1913           Davonna Belling, MD 07/22/20 2249

## 2020-07-22 NOTE — ED Triage Notes (Addendum)
Pt BIB EMS and  is here today due to chest pain. Pt reports that her pain started x 3 days ago. Pt reports h/o GERD but states this feels different

## 2020-07-22 NOTE — Discharge Instructions (Addendum)
The ducts in your liver are mildly dilated.  Follow-up with gastroenterology for further management of this.  Return for worsening abdominal pain.  Take the muscle relaxer to help with the back pain.  Keep a closer watch on your sugars at home.

## 2020-08-18 IMAGING — DX DG CHEST 1V PORT
1 series · 1 of 1 positions shown · non-contrast
Comparison: None.

CLINICAL DATA: Chest pain.

EXAM:
PORTABLE CHEST 1 VIEW

[chest ap]
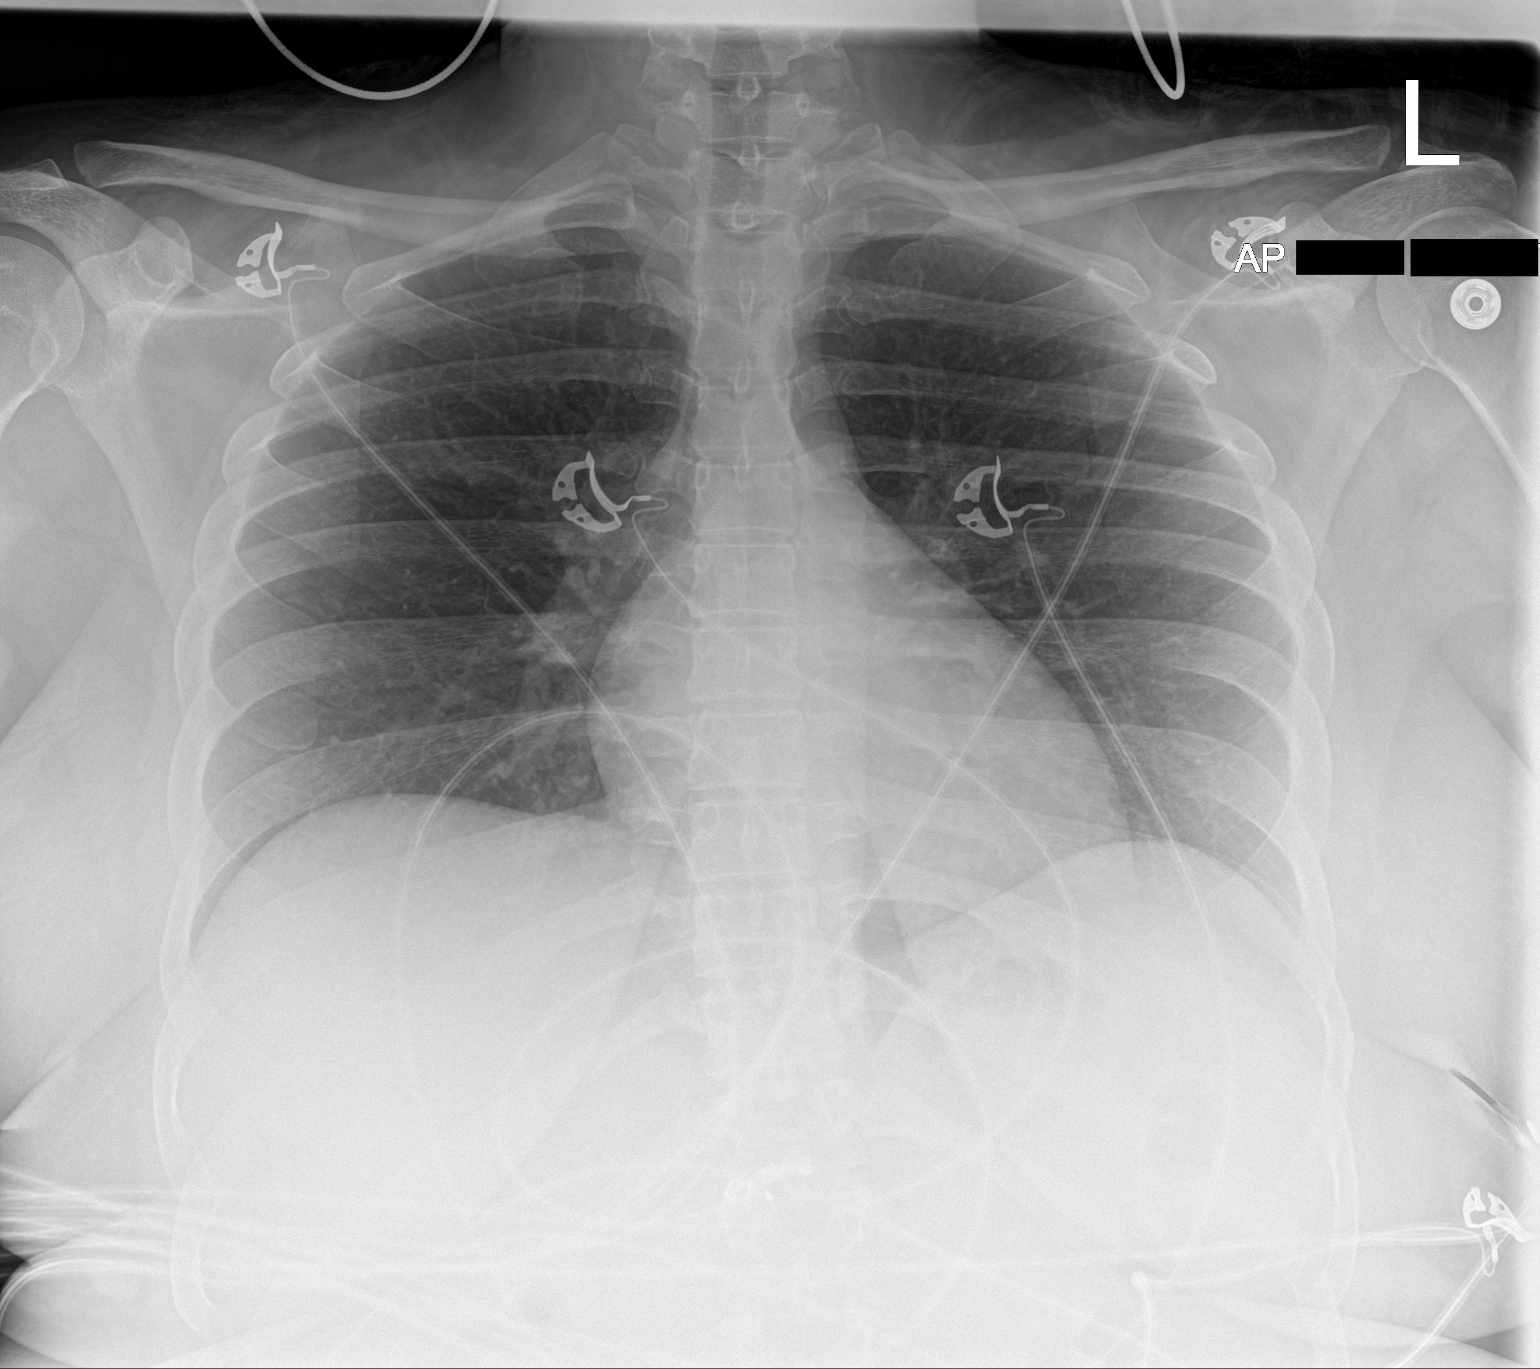

[1 of 1 positions shown; findings below may reference images not displayed]

FINDINGS: The heart size and mediastinal contours are within normal limits.
Both lungs are clear. The visualized skeletal structures are
unremarkable.
IMPRESSION: No active disease.

## 2020-11-03 ENCOUNTER — Emergency Department (HOSPITAL_COMMUNITY)
Admission: EM | Admit: 2020-11-03 | Discharge: 2020-11-03 | Disposition: A | Discharge status: Home / Self Care | Payer: Self-pay | Attending: Emergency Medicine | Admitting: Emergency Medicine

## 2020-11-03 ENCOUNTER — Encounter (HOSPITAL_COMMUNITY): Payer: Self-pay

## 2020-11-03 DIAGNOSIS — R000 Tachycardia, unspecified: Secondary | ICD-10-CM | POA: Insufficient documentation

## 2020-11-03 DIAGNOSIS — R112 Nausea with vomiting, unspecified: Secondary | ICD-10-CM | POA: Insufficient documentation

## 2020-11-03 DIAGNOSIS — J45909 Unspecified asthma, uncomplicated: Secondary | ICD-10-CM | POA: Insufficient documentation

## 2020-11-03 DIAGNOSIS — R197 Diarrhea, unspecified: Secondary | ICD-10-CM | POA: Insufficient documentation

## 2020-11-03 DIAGNOSIS — Z7984 Long term (current) use of oral hypoglycemic drugs: Secondary | ICD-10-CM | POA: Insufficient documentation

## 2020-11-03 DIAGNOSIS — Z20822 Contact with and (suspected) exposure to covid-19: Secondary | ICD-10-CM | POA: Insufficient documentation

## 2020-11-03 DIAGNOSIS — E119 Type 2 diabetes mellitus without complications: Secondary | ICD-10-CM | POA: Insufficient documentation

## 2020-11-03 LAB — COMPREHENSIVE METABOLIC PANEL
ALT: 17 U/L (ref 0–44)
AST: 14 U/L — ABNORMAL LOW (ref 15–41)
Albumin: 3.8 g/dL (ref 3.5–5.0)
Alkaline Phosphatase: 81 U/L (ref 38–126)
Anion gap: 10 (ref 5–15)
BUN: 16 mg/dL (ref 6–20)
CO2: 20 mmol/L — ABNORMAL LOW (ref 22–32)
Calcium: 9 mg/dL (ref 8.9–10.3)
Chloride: 103 mmol/L (ref 98–111)
Creatinine, Ser: 0.64 mg/dL (ref 0.44–1.00)
GFR, Estimated: >60 mL/min (ref >60)
Glucose, Bld: 387 mg/dL — ABNORMAL HIGH (ref 70–99)
Potassium: 4.2 mmol/L (ref 3.5–5.1)
Sodium: 133 mmol/L — ABNORMAL LOW (ref 135–145)
Total Bilirubin: 0.7 mg/dL (ref 0.3–1.2)
Total Protein: 7.7 g/dL (ref 6.5–8.1)

## 2020-11-03 LAB — RESP PANEL BY RT-PCR (FLU A&B, COVID) ARPGX2
Influenza A by PCR: NEGATIVE
Influenza B by PCR: NEGATIVE
SARS Coronavirus 2 by RT PCR: NEGATIVE

## 2020-11-03 LAB — CBC
HCT: 40.9 % (ref 36.0–46.0)
Hemoglobin: 13.2 g/dL (ref 12.0–15.0)
MCH: 24.6 pg — ABNORMAL LOW (ref 26.0–34.0)
MCHC: 32.3 g/dL (ref 30.0–36.0)
MCV: 76.2 fL — ABNORMAL LOW (ref 80.0–100.0)
Platelets: 212 10*3/uL (ref 150–400)
RBC: 5.37 MIL/uL — ABNORMAL HIGH (ref 3.87–5.11)
RDW: 13 % (ref 11.5–15.5)
WBC: 6 10*3/uL (ref 4.0–10.5)
nRBC: 0 % (ref 0.0–0.2)

## 2020-11-03 LAB — I-STAT BETA HCG BLOOD, ED (MC, WL, AP ONLY): I-stat hCG, quantitative: <5 m[IU]/mL (ref <5)

## 2020-11-03 LAB — URINALYSIS, ROUTINE W REFLEX MICROSCOPIC
Bacteria, UA: NONE SEEN
Bilirubin Urine: NEGATIVE
Glucose, UA: >=500 mg/dL — AB
Hgb urine dipstick: NEGATIVE
Ketones, ur: 20 mg/dL — AB
Leukocytes,Ua: NEGATIVE
Nitrite: NEGATIVE
Protein, ur: NEGATIVE mg/dL
Specific Gravity, Urine: 1.024 (ref 1.005–1.030)
pH: 5 (ref 5.0–8.0)

## 2020-11-03 LAB — LIPASE, BLOOD: Lipase: 51 U/L (ref 11–51)

## 2020-11-03 MED ORDER — SODIUM CHLORIDE 0.9 % IV BOLUS
1000.0000 mL | Freq: Once | INTRAVENOUS | Status: AC
  Administered 2020-11-03: 1000 mL via INTRAVENOUS

## 2020-11-03 MED ORDER — ACETAMINOPHEN 325 MG PO TABS
650.0000 mg | ORAL_TABLET | Freq: Once | ORAL | Status: AC
  Administered 2020-11-03: 650 mg via ORAL
  Filled 2020-11-03: qty 2

## 2020-11-03 MED ORDER — ONDANSETRON HCL 4 MG/2ML IJ SOLN
4.0000 mg | Freq: Once | INTRAMUSCULAR | Status: AC
  Administered 2020-11-03: 4 mg via INTRAVENOUS
  Filled 2020-11-03: qty 2

## 2020-11-03 MED ORDER — ONDANSETRON 4 MG PO TBDP: 4.0000 mg | ORAL_TABLET | Freq: Three times a day (TID) | ORAL | 0 refills | Status: DC | PRN

## 2020-11-03 NOTE — ED Provider Notes (Signed)
[facility] DEPT Provider Note   CSN: [id number] Arrival date & time: 11/03/20  1013     History Chief Complaint  Patient presents with   Abdominal Pain   Emesis    [name] is a 35 y.o. female.  Patient presents with chief concern of vomiting and diarrhea since 3 AM last night.  Nonbloody nonbilious vomitus.  Nonbloody diarrhea.  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  She otherwise denies any fevers at home.  No cough no shortness of breath no runny nose no sore throat.  Denies any abdominal pain or chest pain at this time.      Past Medical History:  Diagnosis Date   Asthma    Diabetes mellitus without complication ([place])    Obesity     There are no problems to display for this patient.   Past Surgical History:  Procedure Laterality Date   CESAREAN SECTION     TUBAL LIGATION       OB History   No obstetric history on file.     Family History  Problem Relation Age of Onset   Diabetes Maternal Aunt     Social History   Tobacco Use   Smoking status: Never   Smokeless tobacco: Never  Vaping Use   Vaping Use: Never used  Substance Use Topics   Alcohol use: No   Drug use: No    Home Medications Prior to Admission medications   Medication Sig Start Date End Date Taking? Authorizing Provider  ondansetron (ZOFRAN ODT) 4 MG disintegrating tablet Take 1 tablet (4 mg total) by mouth every 8 (eight) hours as needed for up to 10 doses for nausea or vomiting. 11/03/20  Yes [place], Greggory Brandy, MD  acetaminophen (TYLENOL) 500 MG tablet Take 1,000 mg by mouth every 6 (six) hours as needed for mild pain.    [provider]  albuterol (PROVENTIL HFA;VENTOLIN HFA) 108 (90 Base) MCG/ACT inhaler Inhale 1-2 puffs into the lungs every 6 (six) hours as needed for wheezing or shortness of breath. Patient taking  differently: Inhale 2 puffs into the lungs every 6 (six) hours as needed for wheezing or shortness of breath. 08/16/17   Doristine Devoid, PA-C  albuterol (PROVENTIL) (5 MG/ML) 0.5% nebulizer solution Take 0.5 mLs (2.5 mg total) by nebulization every 6 (six) hours as needed for wheezing or shortness of breath. 08/16/17   Doristine Devoid, PA-C  benzonatate (TESSALON) 100 MG capsule Take 1 capsule (100 mg total) by mouth every 8 (eight) hours. Patient not taking: No sig reported 05/08/20   Tedd Sias, PA  Blood Glucose Monitoring Suppl (TRUE METRIX METER) w/Device KIT 1 each by Does not apply route 4 (four) times daily -  before meals and at bedtime. 12/09/15   Tresa Garter, MD  calcium carbonate (TUMS - DOSED IN MG ELEMENTAL CALCIUM) 500 MG chewable tablet Chew 1 tablet by mouth daily.    [provider]  fluticasone (FLONASE) 50 MCG/ACT nasal spray Place 2 sprays into both nostrils daily for 14 days. Patient taking differently: Place 2 sprays into both nostrils daily as needed for allergies. 05/08/20 05/22/20  Tedd Sias, PA  glucose blood (TRUE METRIX BLOOD GLUCOSE TEST) test strip Use as instructed 12/09/15   Tresa Garter, MD  ibuprofen (ADVIL,MOTRIN) 400 MG tablet Take 1 tablet (400  mg total) by mouth every 6 (six) hours as needed. 08/08/17   Lacretia Leigh, MD  metFORMIN (GLUCOPHAGE) 500 MG tablet Take 1 tablet (500 mg total) by mouth 2 (two) times daily with a meal. 02/25/20 03/26/20  Dorie Rank, MD  methocarbamol (ROBAXIN) 500 MG tablet Take 1 tablet (500 mg total) by mouth every 8 (eight) hours as needed for muscle spasms. 07/22/20   Davonna Belling, MD  Multiple Vitamin (MULTIVITAMIN ADULT PO) Take 1 tablet by mouth daily.    [provider]  naproxen (NAPROSYN) 500 MG tablet Take 1 tablet (500 mg total) by mouth 2 (two) times daily. Patient not taking: No sig reported 10/04/19   Suzy Bouchard, PA-C  pantoprazole (PROTONIX) 20 MG tablet Take 1  tablet (20 mg total) by mouth daily. Patient not taking: No sig reported 10/04/19   Suzy Bouchard, PA-C  promethazine-dextromethorphan (PROMETHAZINE-DM) 6.25-15 MG/5ML syrup Take 5 mLs by mouth 4 (four) times daily as needed for cough. Patient not taking: No sig reported 05/08/20   Tedd Sias, PA  TRUEPLUS LANCETS 28G MISC 1 each by Does not apply route 4 (four) times daily - after meals and at bedtime. 12/09/15   Tresa Garter, MD    Allergies    Other, Shellfish-derived products, and Cefdinir  Review of Systems   Review of Systems  Constitutional:  Negative for fever.  HENT:  Negative for ear pain.   Eyes:  Negative for pain.  Respiratory:  Negative for cough.   Cardiovascular:  Negative for chest pain.  Gastrointestinal:  Positive for diarrhea.  Genitourinary:  Negative for flank pain.  Musculoskeletal:  Negative for back pain.  Skin:  Negative for rash.  Neurological:  Negative for headaches.   Physical Exam Updated Vital Signs BP 130/86 (BP Location: Left Arm)   Pulse (!) 113   Temp 99.2 F (37.3 C) (Oral)   Resp 18   LMP 10/13/2020 (Approximate)   SpO2 98%   Physical Exam Constitutional:      General: She is not in acute distress.    Appearance: Normal appearance.  HENT:     Head: Normocephalic.     Nose: Nose normal.  Eyes:     Extraocular Movements: Extraocular movements intact.  Cardiovascular:     Rate and Rhythm: Tachycardia present.  Pulmonary:     Effort: Pulmonary effort is normal.  Abdominal:     Tenderness: There is no abdominal tenderness.  Musculoskeletal:        General: Normal range of motion.     Cervical back: Normal range of motion.  Neurological:     General: No focal deficit present.     Mental Status: She is alert. Mental status is at baseline.    ED Results / Procedures / Treatments   Labs (all labs ordered are listed, but only abnormal results are displayed) Labs Reviewed  COMPREHENSIVE METABOLIC PANEL - Abnormal;  Notable for the following components:      Result Value   Sodium 133 (*)    CO2 20 (*)    Glucose, Bld 387 (*)    AST 14 (*)    All other components within normal limits  CBC - Abnormal; Notable for the following components:   RBC 5.37 (*)    MCV 76.2 (*)    MCH 24.6 (*)    All other components within normal limits  RESP PANEL BY RT-PCR (FLU A&B, COVID) ARPGX2  LIPASE, BLOOD  URINALYSIS, ROUTINE W REFLEX MICROSCOPIC  I-STAT BETA HCG BLOOD, ED ([facility], [facility], [facility] ONLY)    EKG None  Radiology No results found.  Procedures Procedures   Medications Ordered in ED Medications  sodium chloride 0.9 % bolus 1,000 mL (1,000 mLs Intravenous New Bag/Given 11/03/20 1425)  ondansetron (ZOFRAN) injection 4 mg (4 mg Intravenous Given 11/03/20 1424)  acetaminophen (TYLENOL) tablet 650 mg (650 mg Oral Given 11/03/20 1424)    ED Course  I have reviewed the triage vital signs and the nursing notes.  Pertinent labs & imaging results that were available during my care of the patient were reviewed by me and considered in my medical decision making (see chart for details).    MDM Rules/Calculators/A&P                          Patient mildly tachycardic here.  Given IV fluids IV Zofran subsequently tolerating p.o. hydration.  Labs unremarkable, recommending continued hydration at home.  Recommend immediate return if she is unable to keep down any fluids has fevers pain or any additional concerns.  COVID test sent, recommended isolation until test results have returned appropriately.   Final Clinical Impression(s) / ED Diagnoses Final diagnoses:  Nausea vomiting and diarrhea    Rx / DC Orders ED Discharge Orders          Ordered    ondansetron (ZOFRAN ODT) 4 MG disintegrating tablet  Every 8 hours PRN        11/03/20 1530             Luna Fuse, MD 11/03/20 1530

## 2020-11-03 NOTE — ED Triage Notes (Signed)
C/o emesis with onset at 3am that subsided then started again this morning at work.   C/o diarrhea x3 this morning.   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    A/ox4 Ambulatory in triage

## 2020-11-03 NOTE — Discharge Instructions (Addendum)
If you had any testing done today, the results will show up on your MyChart phone app in 24 hours.  Use a finger pulse oximeter at home.  You may purchase one at [facility] or [facility] or online.  If the numbers drops and stays below 90%, return immediately back to the ER.  Otherwise increase your fluid intake, isolate at home for 5 days after symptoms resolve, and inform recent close contacts of the need to test for Covid.

## 2021-02-04 ENCOUNTER — Encounter (HOSPITAL_COMMUNITY): Payer: Self-pay

## 2021-02-04 ENCOUNTER — Emergency Department (HOSPITAL_COMMUNITY)
Admission: EM | Admit: 2021-02-04 | Discharge: 2021-02-04 | Disposition: A | Discharge status: Home / Self Care | Payer: Self-pay | Attending: Emergency Medicine | Admitting: Emergency Medicine

## 2021-02-04 DIAGNOSIS — R062 Wheezing: Secondary | ICD-10-CM

## 2021-02-04 DIAGNOSIS — Z79899 Other long term (current) drug therapy: Secondary | ICD-10-CM | POA: Insufficient documentation

## 2021-02-04 DIAGNOSIS — Z7984 Long term (current) use of oral hypoglycemic drugs: Secondary | ICD-10-CM | POA: Insufficient documentation

## 2021-02-04 DIAGNOSIS — Z760 Encounter for issue of repeat prescription: Secondary | ICD-10-CM | POA: Insufficient documentation

## 2021-02-04 DIAGNOSIS — J45909 Unspecified asthma, uncomplicated: Secondary | ICD-10-CM | POA: Insufficient documentation

## 2021-02-04 DIAGNOSIS — E119 Type 2 diabetes mellitus without complications: Secondary | ICD-10-CM | POA: Insufficient documentation

## 2021-02-04 MED ORDER — ALBUTEROL SULFATE HFA 108 (90 BASE) MCG/ACT IN AERS
2.0000 | INHALATION_SPRAY | Freq: Once | RESPIRATORY_TRACT | Status: AC
  Administered 2021-02-04: 2 via RESPIRATORY_TRACT
  Filled 2021-02-04: qty 6.7

## 2021-02-04 MED ORDER — ALBUTEROL SULFATE HFA 108 (90 BASE) MCG/ACT IN AERS: 2.0000 | INHALATION_SPRAY | RESPIRATORY_TRACT | Status: DC | PRN

## 2021-02-04 NOTE — Discharge Instructions (Signed)
Use inhaler as needed  Return for new or worsening symptoms

## 2021-02-04 NOTE — ED Notes (Signed)
Pt arrived via POV, c/o asthma has been acting up recently. States she has no albuterol and was looking for refill today.

## 2021-02-04 NOTE — ED Provider Notes (Signed)
[facility] DEPT Provider Note   CSN: [id number] Arrival date & time: 02/04/21  0344     History Chief Complaint  Patient presents with   Medication Refill    [name] is a 35 y.o. female with medical history significant for diabetes, asthma here presents for evaluation of medication refill.  Has history of asthma, does not have any inhalers at home.  States she has had intermittent wheeze over last few days.  No current shortness of breath, chest pain, back pain, fever, chills, lower extremity edema, No history of PE or DVT, recent surgery, immobilization or malignancy.  No cough.  Denies additional aggravating or alleviating factors.  Asthma typically flares with weather changes. Pain 0/10.  History obtained from patient and past medical records.  No interpreter used.  HPI     Past Medical History:  Diagnosis Date   Asthma    Diabetes mellitus without complication ([place])    Obesity     There are no problems to display for this patient.   Past Surgical History:  Procedure Laterality Date   CESAREAN SECTION     TUBAL LIGATION       OB History   No obstetric history on file.     Family History  Problem Relation Age of Onset   Diabetes Maternal Aunt     Social History   Tobacco Use   Smoking status: Never   Smokeless tobacco: Never  Vaping Use   Vaping Use: Never used  Substance Use Topics   Alcohol use: No   Drug use: No    Home Medications Prior to Admission medications   Medication Sig Start Date End Date Taking? Authorizing Provider  acetaminophen (TYLENOL) 500 MG tablet Take 1,000 mg by mouth every 6 (six) hours as needed for mild pain.    [provider]  albuterol (PROVENTIL HFA;VENTOLIN HFA) 108 (90 Base) MCG/ACT inhaler Inhale 1-2 puffs into the lungs every 6 (six) hours as needed for wheezing or shortness of breath. Patient taking differently: Inhale 2 puffs into the lungs every 6 (six) hours as  needed for wheezing or shortness of breath. 08/16/17   Doristine Devoid, PA-C  albuterol (PROVENTIL) (5 MG/ML) 0.5% nebulizer solution Take 0.5 mLs (2.5 mg total) by nebulization every 6 (six) hours as needed for wheezing or shortness of breath. 08/16/17   Doristine Devoid, PA-C  benzonatate (TESSALON) 100 MG capsule Take 1 capsule (100 mg total) by mouth every 8 (eight) hours. Patient not taking: No sig reported 05/08/20   Tedd Sias, PA  Blood Glucose Monitoring Suppl (TRUE METRIX METER) w/Device KIT 1 each by Does not apply route 4 (four) times daily -  before meals and at bedtime. 12/09/15   Tresa Garter, MD  calcium carbonate (TUMS - DOSED IN MG ELEMENTAL CALCIUM) 500 MG chewable tablet Chew 1 tablet by mouth daily.    [provider]  fluticasone (FLONASE) 50 MCG/ACT nasal spray Place 2 sprays into both nostrils daily for 14 days. Patient taking differently: Place 2 sprays into both nostrils daily as needed for allergies. 05/08/20 05/22/20  Tedd Sias, PA  glucose blood (TRUE METRIX BLOOD GLUCOSE TEST) test strip Use as instructed 12/09/15   Tresa Garter, MD  ibuprofen (ADVIL,MOTRIN) 400 MG tablet Take 1 tablet (400 mg total) by mouth every 6 (six) hours as needed. 08/08/17   Lacretia Leigh, MD  metFORMIN (GLUCOPHAGE) 500 MG tablet Take 1 tablet (500 mg total) by  mouth 2 (two) times daily with a meal. 02/25/20 03/26/20  Dorie Rank, MD  methocarbamol (ROBAXIN) 500 MG tablet Take 1 tablet (500 mg total) by mouth every 8 (eight) hours as needed for muscle spasms. 07/22/20   Davonna Belling, MD  Multiple Vitamin (MULTIVITAMIN ADULT PO) Take 1 tablet by mouth daily.    [provider]  naproxen (NAPROSYN) 500 MG tablet Take 1 tablet (500 mg total) by mouth 2 (two) times daily. Patient not taking: No sig reported 10/04/19   Charmaine Downs C, PA-C  ondansetron (ZOFRAN ODT) 4 MG disintegrating tablet Take 1 tablet (4 mg total) by mouth every 8 (eight) hours as  needed for up to 10 doses for nausea or vomiting. 11/03/20   Luna Fuse, MD  pantoprazole (PROTONIX) 20 MG tablet Take 1 tablet (20 mg total) by mouth daily. Patient not taking: No sig reported 10/04/19   Suzy Bouchard, PA-C  promethazine-dextromethorphan (PROMETHAZINE-DM) 6.25-15 MG/5ML syrup Take 5 mLs by mouth 4 (four) times daily as needed for cough. Patient not taking: No sig reported 05/08/20   Tedd Sias, PA  TRUEPLUS LANCETS 28G MISC 1 each by Does not apply route 4 (four) times daily - after meals and at bedtime. 12/09/15   Tresa Garter, MD    Allergies    Other, Shellfish-derived products, and Cefdinir  Review of Systems   Review of Systems  Constitutional: Negative.   HENT: Negative.    Respiratory:  Positive for wheezing.   Cardiovascular: Negative.   Gastrointestinal: Negative.   Genitourinary: Negative.   Musculoskeletal: Negative.   Skin: Negative.   Neurological: Negative.   All other systems reviewed and are negative.  Physical Exam Updated Vital Signs BP (!) 137/92 (BP Location: Left Arm)   Pulse 69   Temp 98.3 F (36.8 C) (Oral)   Resp 19   Ht '5\' 2"'  (1.575 m)   Wt 79.8 kg   SpO2 99%   BMI 32.19 kg/m   Physical Exam Vitals and nursing note reviewed.  Constitutional:      General: She is not in acute distress.    Appearance: She is well-developed. She is not ill-appearing, toxic-appearing or diaphoretic.  HENT:     Head: Normocephalic and atraumatic.     Mouth/Throat:     Mouth: Mucous membranes are moist.  Eyes:     Pupils: Pupils are equal, round, and reactive to light.  Cardiovascular:     Rate and Rhythm: Normal rate.     Pulses: Normal pulses.     Heart sounds: Normal heart sounds.  Pulmonary:     Effort: Pulmonary effort is normal. No respiratory distress.     Breath sounds: Wheezing present.     Comments: Very minimal expiratory wheeze.  Speaks in full sentences without difficulty. Abdominal:     General: Bowel sounds  are normal. There is no distension.     Palpations: Abdomen is soft.  Musculoskeletal:        General: No swelling or tenderness. Normal range of motion.     Cervical back: Normal range of motion.     Right lower leg: No edema.     Left lower leg: No edema.  Skin:    General: Skin is warm and dry.     Capillary Refill: Capillary refill takes less than 2 seconds.  Neurological:     General: No focal deficit present.     Mental Status: She is alert.  Psychiatric:  Mood and Affect: Mood normal.    ED Results / Procedures / Treatments   Labs (all labs ordered are listed, but only abnormal results are displayed) Labs Reviewed - No data to display  EKG None  Radiology No results found.  Procedures Procedures   Medications Ordered in ED Medications  albuterol (VENTOLIN HFA) 108 (90 Base) MCG/ACT inhaler 2 puff (has no administration in time range)   ED Course  I have reviewed the triage vital signs and the nursing notes.  Pertinent labs & imaging results that were available during my care of the patient were reviewed by me and considered in my medical decision making (see chart for details).  Here for evaluation of med refill and wheeze.  She is afebrile, nonseptic, not ill-appearing.  Has minimal wheeze on exam.  She denies any shortness of breath, chest pain.  No clinical evidence of VTE on exam.  She is PERC negative, Wells criteria low risk.  She denies any cough or hemoptysis.  We will refill her albuterol inhaler which is the primary reason she is her for.  Given 2 puffs here in ED.  She has no hypoxia with ambulation.  She has no fever, cough to suggest infectious process.  Do not feel need imaging at this time.  Will DC home with outpatient management.  She is agreeable.  The patient has been appropriately medically screened and/or stabilized in the ED. I have low suspicion for any other emergent medical condition which would require further screening, evaluation or  treatment in the ED or require inpatient management.  Patient is hemodynamically stable and in no acute distress.  Patient able to ambulate in department prior to ED.  Evaluation does not show acute pathology that would require ongoing or additional emergent interventions while in the emergency department or further inpatient treatment.  I have discussed the diagnosis with the patient and answered all questions.  Pain is been managed while in the emergency department and patient has no further complaints prior to discharge.  Patient is comfortable with plan discussed in room and is stable for discharge at this time.  I have discussed strict return precautions for returning to the emergency department.  Patient was encouraged to follow-up with PCP/specialist refer to at discharge.     MDM Rules/Calculators/A&P                            Final Clinical Impression(s) / ED Diagnoses Final diagnoses:  Medication refill  Wheeze    Rx / DC Orders ED Discharge Orders     None        Weston Kallman A, PA-C 02/04/21 0439    Sherwood Gambler, MD 02/04/21 [phone]

## 2021-03-28 ENCOUNTER — Other Ambulatory Visit: Payer: Self-pay

## 2021-03-28 ENCOUNTER — Encounter (HOSPITAL_COMMUNITY): Payer: Self-pay

## 2021-03-28 ENCOUNTER — Emergency Department (HOSPITAL_COMMUNITY)
Admission: EM | Admit: 2021-03-28 | Discharge: 2021-03-28 | Discharge status: Against Medical Advice | Payer: Medicaid Other | Attending: Emergency Medicine | Admitting: Emergency Medicine

## 2021-03-28 DIAGNOSIS — N390 Urinary tract infection, site not specified: Secondary | ICD-10-CM | POA: Insufficient documentation

## 2021-03-28 DIAGNOSIS — Z7951 Long term (current) use of inhaled steroids: Secondary | ICD-10-CM | POA: Insufficient documentation

## 2021-03-28 DIAGNOSIS — Z7984 Long term (current) use of oral hypoglycemic drugs: Secondary | ICD-10-CM | POA: Insufficient documentation

## 2021-03-28 DIAGNOSIS — Z5321 Procedure and treatment not carried out due to patient leaving prior to being seen by health care provider: Secondary | ICD-10-CM | POA: Insufficient documentation

## 2021-03-28 DIAGNOSIS — R739 Hyperglycemia, unspecified: Secondary | ICD-10-CM

## 2021-03-28 DIAGNOSIS — E1165 Type 2 diabetes mellitus with hyperglycemia: Secondary | ICD-10-CM | POA: Insufficient documentation

## 2021-03-28 DIAGNOSIS — J45909 Unspecified asthma, uncomplicated: Secondary | ICD-10-CM | POA: Insufficient documentation

## 2021-03-28 LAB — COMPREHENSIVE METABOLIC PANEL
ALT: 14 U/L (ref 0–44)
AST: 19 U/L (ref 15–41)
Albumin: 3.8 g/dL (ref 3.5–5.0)
Alkaline Phosphatase: 123 U/L (ref 38–126)
Anion gap: 8 (ref 5–15)
BUN: 14 mg/dL (ref 6–20)
CO2: 21 mmol/L — ABNORMAL LOW (ref 22–32)
Calcium: 9.3 mg/dL (ref 8.9–10.3)
Chloride: 103 mmol/L (ref 98–111)
Creatinine, Ser: 0.89 mg/dL (ref 0.44–1.00)
GFR, Estimated: >60 mL/min (ref >60)
Glucose, Bld: 398 mg/dL — ABNORMAL HIGH (ref 70–99)
Potassium: 4.2 mmol/L (ref 3.5–5.1)
Sodium: 132 mmol/L — ABNORMAL LOW (ref 135–145)
Total Bilirubin: 0.7 mg/dL (ref 0.3–1.2)
Total Protein: 7.7 g/dL (ref 6.5–8.1)

## 2021-03-28 LAB — CBC WITH DIFFERENTIAL/PLATELET
Abs Immature Granulocytes: 0.01 10*3/uL (ref 0.00–0.07)
Basophils Absolute: 0.1 10*3/uL (ref 0.0–0.1)
Basophils Relative: 1 %
Eosinophils Absolute: 0.2 10*3/uL (ref 0.0–0.5)
Eosinophils Relative: 3 %
HCT: 36.4 % (ref 36.0–46.0)
Hemoglobin: 11.6 g/dL — ABNORMAL LOW (ref 12.0–15.0)
Immature Granulocytes: 0 %
Lymphocytes Relative: 31 %
Lymphs Abs: 2.2 10*3/uL (ref 0.7–4.0)
MCH: 23.2 pg — ABNORMAL LOW (ref 26.0–34.0)
MCHC: 31.9 g/dL (ref 30.0–36.0)
MCV: 72.9 fL — ABNORMAL LOW (ref 80.0–100.0)
Monocytes Absolute: 0.5 10*3/uL (ref 0.1–1.0)
Monocytes Relative: 7 %
Neutro Abs: 4 10*3/uL (ref 1.7–7.7)
Neutrophils Relative %: 58 %
Platelets: 249 10*3/uL (ref 150–400)
RBC: 4.99 MIL/uL (ref 3.87–5.11)
RDW: 13.9 % (ref 11.5–15.5)
WBC: 7 10*3/uL (ref 4.0–10.5)
nRBC: 0 % (ref 0.0–0.2)

## 2021-03-28 LAB — URINALYSIS, ROUTINE W REFLEX MICROSCOPIC
Bacteria, UA: NONE SEEN
Bilirubin Urine: NEGATIVE
Glucose, UA: >=500 mg/dL — AB
Ketones, ur: 5 mg/dL — AB
Nitrite: NEGATIVE
Protein, ur: NEGATIVE mg/dL
Specific Gravity, Urine: 1.025 (ref 1.005–1.030)
pH: 5 (ref 5.0–8.0)

## 2021-03-28 LAB — CBG MONITORING, ED
Glucose-Capillary: 374 mg/dL — ABNORMAL HIGH (ref 70–99)
Glucose-Capillary: 365 mg/dL — ABNORMAL HIGH (ref 70–99)

## 2021-03-28 LAB — I-STAT BETA HCG BLOOD, ED (MC, WL, AP ONLY): I-stat hCG, quantitative: <5 m[IU]/mL (ref <5)

## 2021-03-28 LAB — LIPASE, BLOOD: Lipase: 81 U/L — ABNORMAL HIGH (ref 11–51)

## 2021-03-28 MED ORDER — INSULIN ASPART 100 UNIT/ML IJ SOLN
5.0000 [IU] | Freq: Once | INTRAMUSCULAR | Status: DC
  Filled 2021-03-28: qty 0.05

## 2021-03-28 MED ORDER — METFORMIN HCL 500 MG PO TABS
1000.0000 mg | ORAL_TABLET | Freq: Once | ORAL | Status: AC
  Administered 2021-03-28: 1000 mg via ORAL
  Filled 2021-03-28: qty 2

## 2021-03-28 MED ORDER — SODIUM CHLORIDE 0.9 % IV BOLUS
1000.0000 mL | Freq: Once | INTRAVENOUS | Status: AC
  Administered 2021-03-28: 1000 mL via INTRAVENOUS

## 2021-03-28 MED ORDER — SULFAMETHOXAZOLE-TRIMETHOPRIM 800-160 MG PO TABS: 1.0000 | ORAL_TABLET | Freq: Two times a day (BID) | ORAL | 0 refills | Status: AC

## 2021-03-28 MED ORDER — FLUCONAZOLE 150 MG PO TABS: 150.0000 mg | ORAL_TABLET | Freq: Every day | ORAL | 0 refills | Status: AC

## 2021-03-28 NOTE — Discharge Instructions (Signed)
Return if you would like [place] to treat your high glucose with insulin.  Otherwise take the medications your doctor wrote for you at home, follow-up with your doctor within the next 1 to 2 days.  Return to the ER if you change your mind or have any additional concerns.

## 2021-03-28 NOTE — ED Triage Notes (Signed)
Pt reports with lower back pain, urinary frequency, vaginal irritation, and hyperglycemia x days. Pt reports not taking her diabetes medication as ordered.

## 2021-03-28 NOTE — ED Provider Notes (Signed)
[facility] DEPT Provider Note   CSN: [id number] Arrival date & time: 03/28/21  2008     History Chief Complaint  Patient presents with   Back Pain   Hyperglycemia   Urinary Frequency    [name] is a 35 y.o. female.  Patient presents with chief complaint of low back pain and urinary frequency and high blood sugars.  She states she has not been taking her diabetes medications because she does not like the way the metformin makes her feel.  She also states that she just had a [employer] sandwich an hour prior to arrival to the ER.  She is concerned about a urinary tract infection with urinary frequency denies any fevers or cough or chills.  Denies vomiting or diarrhea.      Past Medical History:  Diagnosis Date   Asthma    Diabetes mellitus without complication ([place])    Obesity     There are no problems to display for this patient.   Past Surgical History:  Procedure Laterality Date   CESAREAN SECTION     TUBAL LIGATION       OB History   No obstetric history on file.     Family History  Problem Relation Age of Onset   Diabetes Maternal Aunt     Social History   Tobacco Use   Smoking status: Never   Smokeless tobacco: Never  Vaping Use   Vaping Use: Never used  Substance Use Topics   Alcohol use: No   Drug use: No    Home Medications Prior to Admission medications   Medication Sig Start Date End Date Taking? Authorizing Provider  acetaminophen (TYLENOL) 500 MG tablet Take 1,000 mg by mouth every 6 (six) hours as needed for mild pain.    [provider]  albuterol (PROVENTIL HFA;VENTOLIN HFA) 108 (90 Base) MCG/ACT inhaler Inhale 1-2 puffs into the lungs every 6 (six) hours as needed for wheezing or shortness of breath. Patient taking differently: Inhale 2 puffs into the lungs every 6 (six) hours as needed for wheezing or shortness of breath. 08/16/17   Doristine Devoid, PA-C  albuterol (PROVENTIL) (5  MG/ML) 0.5% nebulizer solution Take 0.5 mLs (2.5 mg total) by nebulization every 6 (six) hours as needed for wheezing or shortness of breath. 08/16/17   Doristine Devoid, PA-C  benzonatate (TESSALON) 100 MG capsule Take 1 capsule (100 mg total) by mouth every 8 (eight) hours. Patient not taking: No sig reported 05/08/20   Tedd Sias, PA  Blood Glucose Monitoring Suppl (TRUE METRIX METER) w/Device KIT 1 each by Does not apply route 4 (four) times daily -  before meals and at bedtime. 12/09/15   Tresa Garter, MD  calcium carbonate (TUMS - DOSED IN MG ELEMENTAL CALCIUM) 500 MG chewable tablet Chew 1 tablet by mouth daily.    [provider]  fluticasone (FLONASE) 50 MCG/ACT nasal spray Place 2 sprays into both nostrils daily for 14 days. Patient taking differently: Place 2 sprays into both nostrils daily as needed for allergies. 05/08/20 05/22/20  Tedd Sias, PA  glucose blood (TRUE METRIX BLOOD GLUCOSE TEST) test strip Use as instructed 12/09/15   Tresa Garter, MD  ibuprofen (ADVIL,MOTRIN) 400 MG tablet Take 1 tablet (400 mg total) by mouth every 6 (six) hours as needed. 08/08/17   Lacretia Leigh, MD  metFORMIN (GLUCOPHAGE) 500 MG tablet Take 1 tablet (500 mg total) by mouth 2 (two) times daily with  a meal. 02/25/20 03/26/20  Dorie Rank, MD  methocarbamol (ROBAXIN) 500 MG tablet Take 1 tablet (500 mg total) by mouth every 8 (eight) hours as needed for muscle spasms. 07/22/20   Davonna Belling, MD  Multiple Vitamin (MULTIVITAMIN ADULT PO) Take 1 tablet by mouth daily.    [provider]  naproxen (NAPROSYN) 500 MG tablet Take 1 tablet (500 mg total) by mouth 2 (two) times daily. Patient not taking: No sig reported 10/04/19   Charmaine Downs C, PA-C  ondansetron (ZOFRAN ODT) 4 MG disintegrating tablet Take 1 tablet (4 mg total) by mouth every 8 (eight) hours as needed for up to 10 doses for nausea or vomiting. 11/03/20   Luna Fuse, MD  pantoprazole (PROTONIX) 20  MG tablet Take 1 tablet (20 mg total) by mouth daily. Patient not taking: No sig reported 10/04/19   Suzy Bouchard, PA-C  promethazine-dextromethorphan (PROMETHAZINE-DM) 6.25-15 MG/5ML syrup Take 5 mLs by mouth 4 (four) times daily as needed for cough. Patient not taking: No sig reported 05/08/20   Tedd Sias, PA  TRUEPLUS LANCETS 28G MISC 1 each by Does not apply route 4 (four) times daily - after meals and at bedtime. 12/09/15   Tresa Garter, MD    Allergies    Other, Shellfish-derived products, and Cefdinir  Review of Systems   Review of Systems  Constitutional:  Negative for fever.  HENT:  Negative for ear pain.   Eyes:  Negative for pain.  Respiratory:  Negative for cough.   Cardiovascular:  Negative for chest pain.  Gastrointestinal:  Negative for abdominal pain.  Genitourinary:  Negative for flank pain.  Musculoskeletal:  Negative for back pain.  Skin:  Negative for rash.  Neurological:  Negative for headaches.   Physical Exam Updated Vital Signs BP (!) 141/81   Pulse 79   Temp 98.5 F (36.9 C) (Oral)   Resp 20   Ht '5\' 2"'  (1.575 m)   Wt 79.8 kg   SpO2 100%   BMI 32.19 kg/m   Physical Exam Constitutional:      General: She is not in acute distress.    Appearance: Normal appearance.  HENT:     Head: Normocephalic.     Nose: Nose normal.  Eyes:     Extraocular Movements: Extraocular movements intact.  Cardiovascular:     Rate and Rhythm: Normal rate.  Pulmonary:     Effort: Pulmonary effort is normal.  Abdominal:     Tenderness: There is no right CVA tenderness or left CVA tenderness.  Musculoskeletal:        General: Normal range of motion.     Cervical back: Normal range of motion.  Neurological:     General: No focal deficit present.     Mental Status: She is alert. Mental status is at baseline.    ED Results / Procedures / Treatments   Labs (all labs ordered are listed, but only abnormal results are displayed) Labs Reviewed   URINALYSIS, ROUTINE W REFLEX MICROSCOPIC - Abnormal; Notable for the following components:      Result Value   Color, Urine STRAW (*)    APPearance HAZY (*)    Glucose, UA >=500 (*)    Hgb urine dipstick SMALL (*)    Ketones, ur 5 (*)    Leukocytes,Ua SMALL (*)    All other components within normal limits  COMPREHENSIVE METABOLIC PANEL - Abnormal; Notable for the following components:   Sodium 132 (*)  CO2 21 (*)    Glucose, Bld 398 (*)    All other components within normal limits  LIPASE, BLOOD - Abnormal; Notable for the following components:   Lipase 81 (*)    All other components within normal limits  CBC WITH DIFFERENTIAL/PLATELET - Abnormal; Notable for the following components:   Hemoglobin 11.6 (*)    MCV 72.9 (*)    MCH 23.2 (*)    All other components within normal limits  CBG MONITORING, ED - Abnormal; Notable for the following components:   Glucose-Capillary 374 (*)    All other components within normal limits  CBG MONITORING, ED - Abnormal; Notable for the following components:   Glucose-Capillary 365 (*)    All other components within normal limits  I-STAT BETA HCG BLOOD, ED ([facility], [facility], [facility] ONLY)    EKG None  Radiology No results found.  Procedures Procedures   Medications Ordered in ED Medications  insulin aspart (novoLOG) injection 5 Units (5 Units Intravenous Patient Refused/Not Given 03/28/21 2122)  metFORMIN (GLUCOPHAGE) tablet 1,000 mg (has no administration in time range)  sodium chloride 0.9 % bolus 1,000 mL (0 mLs Intravenous Stopped 03/28/21 2248)    ED Course  I have reviewed the triage vital signs and the nursing notes.  Pertinent labs & imaging results that were available during my care of the patient were reviewed by me and considered in my medical decision making (see chart for details).    MDM Rules/Calculators/A&P                           Labs showed hyperglycemia but no evidence of DKA.  Patient given liter bolus of fluids and  insulin ordered.  However the patient refused insulin.  Prefers to try metformin again.  Advised her that this would not be effective and it would likely not affect her hyperglycemia acutely.  Patient continues declined the medication.  Otherwise she is neurologically stable vital signs are normal not in DKA.  Declines insulin at this time.  Advised outpatient management with her doctor within the week.  Advised return if if her blood sugars remain high or if she has any additional concerns.  Final Clinical Impression(s) / ED Diagnoses Final diagnoses:  Lower urinary tract infectious disease  Hyperglycemia    Rx / DC Orders ED Discharge Orders     None        Luna Fuse, MD 03/28/21 2249

## 2021-03-28 NOTE — ED Provider Notes (Cosign Needed)
Emergency Medicine Provider Triage Evaluation Note  [name] , a 35 y.o. female  was evaluated in triage.  Pt complains of low back pain.  She states for 4 days she has had increasing blood sugars with increased urination and thirst.  Endorses nausea without vomiting.  Denies diarrhea or fevers.  She also endorses that she has had recurrent yeast infections which she is taking Monistat for right now.  She states that she stopped taking her diabetes medication because it made her stomach upset.  Review of Systems  Positive: See above Negative:   Physical Exam  BP (!) 150/88 (BP Location: Left Arm)   Pulse 80   Temp 98.5 F (36.9 C) (Oral)   Resp 18   Ht 5\' 2"  (1.575 m)   Wt 79.8 kg   SpO2 98%   BMI 32.19 kg/m  Gen:   Awake, no distress   Resp:  Normal effort  MSK:   Moves extremities without difficulty  Other:  Abdomen is soft and nontender.  Bowel sounds present.  Medical Decision Making  Medically screening exam initiated at 8:25 PM.  Appropriate orders placed.  was informed that the remainder of the evaluation will be completed by another provider, this initial triage assessment does not replace that evaluation, and the importance of remaining in the ED until their evaluation is complete.     Shirleen Schirmer, PA-C 03/28/21 2026

## 2021-03-28 NOTE — ED Notes (Addendum)
Went in to give IV insulin. Patient states she has never had insulin and does not want insulin. Educated patient on need for immediate blood sugar control due to her serum glucose being 398. Patient verbalized understanding, but still states she did not want the insulin, and wanted to talk to her doctor about trying 500 mg metformin BID instead of QD. Audley Hose, MD notified, will recheck blood sugar after fluids

## 2021-06-06 ENCOUNTER — Encounter (HOSPITAL_COMMUNITY): Payer: Self-pay

## 2021-06-06 ENCOUNTER — Other Ambulatory Visit: Payer: Self-pay

## 2021-06-06 ENCOUNTER — Emergency Department (HOSPITAL_COMMUNITY)
Admission: EM | Admit: 2021-06-06 | Discharge: 2021-06-06 | Disposition: A | Discharge status: Home / Self Care | Payer: Self-pay | Attending: Emergency Medicine | Admitting: Emergency Medicine

## 2021-06-06 DIAGNOSIS — K029 Dental caries, unspecified: Secondary | ICD-10-CM | POA: Insufficient documentation

## 2021-06-06 DIAGNOSIS — E119 Type 2 diabetes mellitus without complications: Secondary | ICD-10-CM | POA: Insufficient documentation

## 2021-06-06 DIAGNOSIS — J45909 Unspecified asthma, uncomplicated: Secondary | ICD-10-CM | POA: Insufficient documentation

## 2021-06-06 DIAGNOSIS — Z7984 Long term (current) use of oral hypoglycemic drugs: Secondary | ICD-10-CM | POA: Insufficient documentation

## 2021-06-06 MED ORDER — FLUCONAZOLE 150 MG PO TABS: ORAL_TABLET | ORAL | 0 refills | Status: DC

## 2021-06-06 MED ORDER — HYDROCODONE-ACETAMINOPHEN 5-325 MG PO TABS: 2.0000 | ORAL_TABLET | Freq: Four times a day (QID) | ORAL | 0 refills | Status: DC | PRN

## 2021-06-06 MED ORDER — CLINDAMYCIN HCL 300 MG PO CAPS
300.0000 mg | ORAL_CAPSULE | Freq: Once | ORAL | Status: AC
  Administered 2021-06-06: 300 mg via ORAL
  Filled 2021-06-06: qty 1

## 2021-06-06 MED ORDER — CLINDAMYCIN HCL 300 MG PO CAPS: 300.0000 mg | ORAL_CAPSULE | Freq: Four times a day (QID) | ORAL | 0 refills | Status: DC

## 2021-06-06 NOTE — ED Triage Notes (Signed)
Pt complains of right sided dental pain and right ear pain x 2 days.

## 2021-06-06 NOTE — ED Provider Notes (Signed)
[facility] DEPT Provider Note: Georgena Spurling, MD, FACEP  CSN: [id number] MRN: [id number] ARRIVAL: 06/06/21 at [facility]: [place]  06/06/21 5:53 AM [name] is a 36 y.o. female who has a broken right upper first molar.  She has had pain at that site for about the past week, worse over the past 2 days.  She rates the pain as an 8 out of 10, aching in nature.  Is worse with eating.  She has not gotten adequate pain relief with over-the-counter analgesics.  She does not currently have a Pharmacist, community.   Past Medical History:  Diagnosis Date   Asthma    Diabetes mellitus without complication ([place])    Obesity     Past Surgical History:  Procedure Laterality Date   CESAREAN SECTION     TUBAL LIGATION      Family History  Problem Relation Age of Onset   Diabetes Maternal Aunt     Social History   Tobacco Use   Smoking status: Never   Smokeless tobacco: Never  Vaping Use   Vaping Use: Never used  Substance Use Topics   Alcohol use: No   Drug use: No    Prior to Admission medications   Medication Sig Start Date End Date Taking? Authorizing Provider  clindamycin (CLEOCIN) 300 MG capsule Take 1 capsule (300 mg total) by mouth 4 (four) times daily. X 7 days 06/06/21  Yes Ersie Savino, MD  fluconazole (DIFLUCAN) 150 MG tablet Take 1 tablet as needed for vaginal yeast infection.  May repeat in 3 days if symptoms persist. 06/06/21  Yes Louise Rawson, Jenny Reichmann, MD  HYDROcodone-acetaminophen (NORCO) 5-325 MG tablet Take 2 tablets by mouth every 6 (six) hours as needed for severe pain. 06/06/21  Yes Teren Zurcher, MD  albuterol (PROVENTIL HFA;VENTOLIN HFA) 108 (90 Base) MCG/ACT inhaler Inhale 1-2 puffs into the lungs every 6 (six) hours as needed for wheezing or shortness of breath. Patient taking differently: Inhale 2 puffs into the lungs every 6 (six) hours as needed for wheezing or shortness of breath. 08/16/17   Doristine Devoid, PA-C  albuterol (PROVENTIL) (5 MG/ML) 0.5% nebulizer solution Take 0.5 mLs (2.5 mg total) by nebulization every 6 (six) hours as needed for wheezing or shortness of breath. 08/16/17   Doristine Devoid, PA-C  Blood Glucose Monitoring Suppl (TRUE METRIX METER) w/Device KIT 1 each by Does not apply route 4 (four) times daily -  before meals and at bedtime. 12/09/15   Tresa Garter, MD  calcium carbonate (TUMS - DOSED IN MG ELEMENTAL CALCIUM) 500 MG chewable tablet Chew 1 tablet by mouth daily.    [provider]  fluticasone (FLONASE) 50 MCG/ACT nasal spray Place 2 sprays into both nostrils daily for 14 days. Patient taking differently: Place 2 sprays into both nostrils daily as needed for allergies. 05/08/20 05/22/20  Tedd Sias, PA  glucose blood (TRUE METRIX BLOOD GLUCOSE TEST) test strip Use as instructed 12/09/15   Tresa Garter, MD  metFORMIN (GLUCOPHAGE) 500 MG tablet Take 1 tablet (500 mg total) by mouth 2 (two) times daily with a meal. 02/25/20 03/26/20  Dorie Rank, MD  Multiple Vitamin (MULTIVITAMIN ADULT PO) Take 1 tablet by mouth daily.    [provider]  ondansetron (ZOFRAN ODT) 4 MG disintegrating tablet Take 1 tablet (4 mg total) by mouth every 8 (eight) hours as needed for up to 10 doses  for nausea or vomiting. 11/03/20   Luna Fuse, MD  TRUEPLUS LANCETS 28G MISC 1 each by Does not apply route 4 (four) times daily - after meals and at bedtime. 12/09/15   Tresa Garter, MD    Allergies Other, Shellfish-derived products, and Cefdinir   REVIEW OF SYSTEMS  Negative except as noted here or in the History of Present Illness.   PHYSICAL EXAMINATION  Initial Vital Signs Blood pressure 131/84, pulse 82, temperature 98.6 F (37 C), temperature source Oral, resp. rate 16, height _0  (1.575 m), weight 86.2 kg, SpO2 100 %.  Examination General: Well-developed, well-nourished female in no acute distress; appearance consistent with age  of record HENT: normocephalic; atraumatic; right upper first molar carious or fractured to the gumline Eyes: Normal appearance Neck: supple Heart: regular rate and rhythm Lungs: clear to auscultation bilaterally Abdomen: soft; nondistended; nontender; bowel sounds present Extremities: No deformity; full range of motion Neurologic: Awake, alert and oriented; motor function intact in all extremities and symmetric; no facial droop Skin: Warm and dry Psychiatric: Normal mood and affect   RESULTS  Summary of this visit's results, reviewed and interpreted by myself:   EKG Interpretation  Date/Time:    Ventricular Rate:    PR Interval:    QRS Duration:   QT Interval:    QTC Calculation:   R Axis:     Text Interpretation:         Laboratory Studies: No results found for this or any previous visit (from the past 24 hour(s)). Imaging Studies: No results found.  ED COURSE and MDM  Nursing notes, initial and subsequent vitals signs, including pulse oximetry, reviewed and interpreted by myself.  Vitals:   06/06/21 0533  BP: 131/84  Pulse: 82  Resp: 16  Temp: 98.6 F (37 C)  TempSrc: Oral  SpO2: 100%  Weight: 86.2 kg  Height: _1  (1.575 m)   Medications  clindamycin (CLEOCIN) capsule 300 mg (has no administration in time range)   We will start the patient on clindamycin for infection prophylaxis and refer to dentistry.  She may need further referral to an oral surgeon for definitive removal of the remaining root.   PROCEDURES  Procedures   ED DIAGNOSES     ICD-10-CM   1. Pain due to dental caries  K02.9          Hassel Uphoff, MD 06/06/21 [phone]

## 2021-07-27 ENCOUNTER — Emergency Department (HOSPITAL_COMMUNITY)
Admission: EM | Admit: 2021-07-27 | Discharge: 2021-07-27 | Disposition: A | Discharge status: Home / Self Care | Payer: 59 | Attending: Emergency Medicine | Admitting: Emergency Medicine

## 2021-07-27 ENCOUNTER — Other Ambulatory Visit: Payer: Self-pay

## 2021-07-27 ENCOUNTER — Encounter (HOSPITAL_COMMUNITY): Payer: Self-pay

## 2021-07-27 DIAGNOSIS — Z20822 Contact with and (suspected) exposure to covid-19: Secondary | ICD-10-CM | POA: Insufficient documentation

## 2021-07-27 DIAGNOSIS — J45909 Unspecified asthma, uncomplicated: Secondary | ICD-10-CM | POA: Diagnosis not present

## 2021-07-27 DIAGNOSIS — R0981 Nasal congestion: Secondary | ICD-10-CM | POA: Diagnosis present

## 2021-07-27 DIAGNOSIS — J069 Acute upper respiratory infection, unspecified: Secondary | ICD-10-CM

## 2021-07-27 LAB — RESP PANEL BY RT-PCR (FLU A&B, COVID) ARPGX2
Influenza A by PCR: NEGATIVE
Influenza B by PCR: NEGATIVE
SARS Coronavirus 2 by RT PCR: NEGATIVE

## 2021-07-27 MED ORDER — IBUPROFEN 200 MG PO TABS
400.0000 mg | ORAL_TABLET | Freq: Once | ORAL | Status: AC
  Administered 2021-07-27: 400 mg via ORAL
  Filled 2021-07-27: qty 2

## 2021-07-27 MED ORDER — LORATADINE 10 MG PO TABS
10.0000 mg | ORAL_TABLET | Freq: Once | ORAL | Status: AC
  Administered 2021-07-27: 10 mg via ORAL
  Filled 2021-07-27: qty 1

## 2021-07-27 NOTE — ED Triage Notes (Addendum)
Patient presents to ED from home with c/o asthma, chills, and generalized body aches and pains x 2 days. Pt last used inhaler 30 mins ago.  ?

## 2021-07-27 NOTE — ED Provider Notes (Signed)
?[facility] DEPT ?Provider Note ? ? ?CSN: [id number] ?Arrival date & time: 07/27/21  0414 ? ?  ? ?History ? ?Chief Complaint  ?Patient presents with  ? Asthma  ? ? ?[name] is a 36 y.o. female. ? ?The history is provided by the patient.  ?Asthma ?This is a recurrent problem. The current episode started more than 2 days ago. The problem occurs constantly. The problem has been resolved. Pertinent negatives include no chest pain, no abdominal pain, no headaches and no shortness of breath. Nothing aggravates the symptoms. Nothing relieves the symptoms. Treatments tried: puff of albuterol. The treatment provided significant relief.  ?URI ?Presenting symptoms: congestion, cough and rhinorrhea   ?Presenting symptoms: no fever and no sore throat   ?Presenting symptoms comment:  Body aches  ?Severity:  Moderate ?Onset quality:  Gradual ?Duration:  2 days ?Timing:  Constant ?Progression:  Unchanged ?Chronicity:  New ?Relieved by:  Nothing ?Worsened by:  Nothing ?Ineffective treatments:  None tried ?Associated symptoms: myalgias   ?Associated symptoms: no headaches   ?Risk factors: not elderly   ? ?  ? ?Home Medications ?Prior to Admission medications   ?Medication Sig Start Date End Date Taking? Authorizing Provider  ?albuterol (PROVENTIL HFA;VENTOLIN HFA) 108 (90 Base) MCG/ACT inhaler Inhale 1-2 puffs into the lungs every 6 (six) hours as needed for wheezing or shortness of breath. ?Patient taking differently: Inhale 2 puffs into the lungs every 6 (six) hours as needed for wheezing or shortness of breath. 08/16/17  Yes Ocie Cornfield T, PA-C  ?metFORMIN (GLUCOPHAGE) 500 MG tablet Take 1 tablet (500 mg total) by mouth 2 (two) times daily with a meal. 02/25/20 07/28/22 Yes Dorie Rank, MD  ?Multiple Vitamin (MULTIVITAMIN ADULT PO) Take 1 tablet by mouth daily.   Yes [provider]  ?albuterol (PROVENTIL) (5 MG/ML) 0.5% nebulizer solution Take 0.5 mLs (2.5 mg total) by  nebulization every 6 (six) hours as needed for wheezing or shortness of breath. ?Patient not taking: Reported on 07/27/2021 08/16/17   Doristine Devoid, PA-C  ?Blood Glucose Monitoring Suppl (TRUE METRIX METER) w/Device KIT 1 each by Does not apply route 4 (four) times daily -  before meals and at bedtime. 12/09/15   Tresa Garter, MD  ?glucose blood (TRUE METRIX BLOOD GLUCOSE TEST) test strip Use as instructed 12/09/15   Tresa Garter, MD  ?TRUEPLUS LANCETS 28G MISC 1 each by Does not apply route 4 (four) times daily - after meals and at bedtime. 12/09/15   Tresa Garter, MD  ?   ? ?Allergies    ?Other, Shellfish-derived products, and Cefdinir   ? ?Review of Systems   ?Review of Systems  ?Constitutional:  Negative for fever.  ?HENT:  Positive for congestion and rhinorrhea. Negative for sore throat.   ?Eyes:  Negative for redness.  ?Respiratory:  Positive for cough. Negative for shortness of breath.   ?Cardiovascular:  Negative for chest pain.  ?Gastrointestinal:  Negative for abdominal pain.  ?Genitourinary:  Negative for dysuria.  ?Musculoskeletal:  Positive for myalgias.  ?Skin:  Negative for rash.  ?Neurological:  Negative for headaches.  ?All other systems reviewed and are negative. ? ?Physical Exam ?Updated Vital Signs ?BP (!) 146/90 (BP Location: Right Arm)   Pulse 74   Temp 97.6 ?F (36.4 ?C) (Oral)   Resp 16   Ht _0  (1.575 m)   Wt 78.5 kg   SpO2 100%   BMI 31.64 kg/m?  ?Physical Exam ?Vitals and nursing  note reviewed.  ?Constitutional:   ?   General: She is not in acute distress. ?   Appearance: Normal appearance.  ?HENT:  ?   Head: Normocephalic and atraumatic.  ?   Nose: Nose normal.  ?Eyes:  ?   Conjunctiva/sclera: Conjunctivae normal.  ?   Pupils: Pupils are equal, round, and reactive to light.  ?Cardiovascular:  ?   Rate and Rhythm: Normal rate and regular rhythm.  ?   Pulses: Normal pulses.  ?   Heart sounds: Normal heart sounds.  ?Pulmonary:  ?   Effort: Pulmonary effort is  normal.  ?   Breath sounds: Normal breath sounds.  ?Abdominal:  ?   General: Bowel sounds are normal.  ?   Palpations: Abdomen is soft.  ?   Tenderness: There is no abdominal tenderness. There is no guarding.  ?Musculoskeletal:     ?   General: Normal range of motion.  ?   Cervical back: Normal range of motion and neck supple.  ?Skin: ?   General: Skin is warm and dry.  ?   Capillary Refill: Capillary refill takes less than 2 seconds.  ?Neurological:  ?   General: No focal deficit present.  ?   Mental Status: She is alert and oriented to person, place, and time.  ?   Deep Tendon Reflexes: Reflexes normal.  ?Psychiatric:     ?   Mood and Affect: Mood normal.     ?   Behavior: Behavior normal.  ? ? ?ED Results / Procedures / Treatments   ?Labs ?(all labs ordered are listed, but only abnormal results are displayed) ?Labs Reviewed  ?RESP PANEL BY RT-PCR (FLU A&B, COVID) ARPGX2  ? ? ?EKG ?None ? ?Radiology ?No results found. ? ?Procedures ?Procedures  ? ? ?Medications Ordered in ED ?Medications  ?ibuprofen (ADVIL) tablet 400 mg (400 mg Oral Given 07/27/21 0444)  ?loratadine (CLARITIN) tablet 10 mg (10 mg Oral Given 07/27/21 0444)  ? ? ?ED Course/ Medical Decision Making/ A&P ?  ?                        ?Medical Decision Making ?URI symptoms and wheezing, not currently wheezing  ? ?Amount and/or Complexity of Data Reviewed ?Labs: ordered. ?   Details: negative covid and flu ? ?Risk ?OTC drugs. ?Risk Details: Patient is not currently wheezing, I suspect she is here as she wants a work note.  This is provided.  Alternate tylenol and ibuprofen for pain.  Stable for discharge with close follow up.   ? ? ?Final Clinical Impression(s) / ED Diagnoses ?Final diagnoses:  ?None  ?Return for intractable cough, coughing up blood, fevers > 100.4 unrelieved by medication, shortness of breath, intractable vomiting, chest pain, shortness of breath, weakness, numbness, changes in speech, facial asymmetry, abdominal pain, passing out,  Inability to tolerate liquids or food, cough, altered mental status or any concerns. No signs of systemic illness or infection. The patient is nontoxic-appearing on exam and vital signs are within normal limits.  ?I have reviewed the triage vital signs and the nursing notes. Pertinent labs & imaging results that were available during my care of the patient were reviewed by me and considered in my medical decision making (see chart for details). After history, exam, and medical workup I feel the patient has been appropriately medically screened and is safe for discharge home. Pertinent diagnoses were discussed with the patient. Patient was given return precautions. ?  ?  ? ?  Rx / DC Orders ?ED Discharge Orders   ? ? None  ? ?  ? ? ?  ?Beckie Viscardi, MD ?07/27/21 0517 ? ?

## 2021-08-06 ENCOUNTER — Other Ambulatory Visit: Payer: Self-pay

## 2021-08-06 ENCOUNTER — Encounter (HOSPITAL_COMMUNITY): Payer: Self-pay

## 2021-08-06 ENCOUNTER — Emergency Department (HOSPITAL_COMMUNITY)
Admission: EM | Admit: 2021-08-06 | Discharge: 2021-08-06 | Disposition: A | Discharge status: Home / Self Care | Payer: 59 | Attending: Emergency Medicine | Admitting: Emergency Medicine

## 2021-08-06 DIAGNOSIS — M62838 Other muscle spasm: Secondary | ICD-10-CM | POA: Insufficient documentation

## 2021-08-06 DIAGNOSIS — Y990 Civilian activity done for income or pay: Secondary | ICD-10-CM | POA: Diagnosis not present

## 2021-08-06 DIAGNOSIS — X500XXA Overexertion from strenuous movement or load, initial encounter: Secondary | ICD-10-CM | POA: Insufficient documentation

## 2021-08-06 DIAGNOSIS — M549 Dorsalgia, unspecified: Secondary | ICD-10-CM | POA: Diagnosis present

## 2021-08-06 MED ORDER — CYCLOBENZAPRINE HCL 10 MG PO TABS: 10.0000 mg | ORAL_TABLET | Freq: Three times a day (TID) | ORAL | 0 refills | Status: AC | PRN

## 2021-08-06 MED ORDER — KETOROLAC TROMETHAMINE 30 MG/ML IJ SOLN
30.0000 mg | Freq: Once | INTRAMUSCULAR | Status: AC
  Administered 2021-08-06: 30 mg via INTRAMUSCULAR
  Filled 2021-08-06: qty 1

## 2021-08-06 MED ORDER — DIAZEPAM 5 MG PO TABS: 5.0000 mg | ORAL_TABLET | Freq: Once | ORAL | Status: DC

## 2021-08-06 NOTE — ED Provider Notes (Signed)
?  [place] DEPT ?[facility] Emergency Department ?Provider Note ?MRN:  [id number]  ?Arrival date & time: 08/06/21    ? ?Chief Complaint   ?Neck Pain ?  ?History of Present Illness   ?[name] is a 36 y.o. year-old female presents to the ED with chief complaint of left upper back pain.  She states that she noticed the symptoms yesterday morning.  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  She states that she can feel a knot in her upper back that is tender to the touch. ? ?History provided by patient. ? ? ?Review of Systems  ?Pertinent review of systems noted in HPI.  ? ? ?Physical Exam  ? ?Vitals:  ? 08/06/21 0154  ?BP: (!) 155/87  ?Pulse: 84  ?Resp: 17  ?Temp: (!) 97.4 ?F (36.3 ?C)  ?SpO2: 98%  ?  ?CONSTITUTIONAL:  well-appearing, NAD ?NEURO:  Alert and oriented x 3, CN 3-12 grossly intact ?EYES:  eyes equal and reactive ?ENT/NECK:  Supple, no stridor  ?CARDIO:  Appears well-perfused  ?PULM:  No respiratory distress,  ?GI/GU:  non-distended,  ?MSK/SPINE:  No gross deformities, no edema, moves all extremities, trigger point to left upper trap, tender to touch, muscle is tight ?SKIN:  no rash, atraumatic ? ? ?*Additional and/or pertinent findings included in MDM below ? ?Diagnostic and Interventional Summary  ? ? ?Labs Reviewed - No data to display  ?No orders to display  ?  ?Medications  ?ketorolac (TORADOL) 30 MG/ML injection 30 mg (has no administration in time range)  ?diazepam (VALIUM) tablet 5 mg (has no administration in time range)  ?  ? ?Procedures  /  Critical Care ?Procedures ? ?ED Course and Medical Decision Making  ?I have reviewed the triage vital signs, the nursing notes, and pertinent available records from the EMR. ? ?Complexity of Problems Addressed: ?Low Complexity: Acute, uncomplicated illness or injury requiring no diagnostic workup ?Comorbidities affecting this illness/injury include: ?None ?Social Determinants Affecting Care: ?Complexity of  care is increased due to access to medical care. ? ? ?ED Course: ?After considering the following differential, muscle strain, spasm, shingles, rotator cuff injury, I ordered toradol and valium for treatment of pain and muscle spasm. ? ?No fall or trauma, no bony tenderness.  Doubt usefulness of x-ray. ? ?  ? ?Consultants: ?No consultations were needed in caring for this patient. ? ?Treatment and Plan: ?Treat for muscle spasm. ? ?Emergency department workup does not suggest an emergent condition requiring admission or immediate intervention beyond  what has been performed at this time. The patient is safe for discharge and has  been instructed to return immediately for worsening symptoms, change in  symptoms or any other concerns ? ? ? ?Final Clinical Impressions(s) / ED Diagnoses  ? ?  ICD-10-CM   ?1. Muscle spasm  M62.838   ?  ?  ?ED Discharge Orders   ? ?      Ordered  ?  cyclobenzaprine (FLEXERIL) 10 MG tablet  3 times daily PRN       ? 08/06/21 0218  ? ?  ?  ? ?  ?  ? ? ?Discharge Instructions Discussed with and Provided to Patient:  ? ?Discharge Instructions   ?None ?  ? ?  ?Montine Circle, PA-C ?08/06/21 [id number] ? ?  ?Quintella Reichert, MD ?08/06/21 2249 ? ?

## 2021-08-06 NOTE — ED Notes (Signed)
Patient verbalizes understanding of discharge instructions. Opportunity for questioning and answers were provided. Armband removed by staff, pt discharged from ED. Ambulated out to lobby  

## 2021-08-06 NOTE — ED Triage Notes (Signed)
Patient said she woke up from her sleep yesterday morning and cant bend her neck and unable to move her left arm. Does not recall lifting anything heavy or sleeping on it wrong. Patient feels a knot/ swollen in her neck.  ?

## 2021-10-15 ENCOUNTER — Emergency Department (HOSPITAL_COMMUNITY)
Admission: EM | Admit: 2021-10-15 | Discharge: 2021-10-15 | Disposition: A | Discharge status: Home / Self Care | Payer: 59 | Attending: Emergency Medicine | Admitting: Emergency Medicine

## 2021-10-15 ENCOUNTER — Encounter (HOSPITAL_COMMUNITY): Payer: Self-pay | Admitting: Emergency Medicine

## 2021-10-15 ENCOUNTER — Other Ambulatory Visit: Payer: Self-pay

## 2021-10-15 DIAGNOSIS — Z79899 Other long term (current) drug therapy: Secondary | ICD-10-CM | POA: Diagnosis not present

## 2021-10-15 DIAGNOSIS — Z7984 Long term (current) use of oral hypoglycemic drugs: Secondary | ICD-10-CM | POA: Diagnosis not present

## 2021-10-15 DIAGNOSIS — J45909 Unspecified asthma, uncomplicated: Secondary | ICD-10-CM | POA: Insufficient documentation

## 2021-10-15 DIAGNOSIS — E119 Type 2 diabetes mellitus without complications: Secondary | ICD-10-CM | POA: Insufficient documentation

## 2021-10-15 DIAGNOSIS — K0889 Other specified disorders of teeth and supporting structures: Secondary | ICD-10-CM | POA: Insufficient documentation

## 2021-10-15 MED ORDER — FLUCONAZOLE 150 MG PO TABS: 150.0000 mg | ORAL_TABLET | Freq: Every day | ORAL | 0 refills | Status: AC

## 2021-10-15 MED ORDER — AMOXICILLIN-POT CLAVULANATE 875-125 MG PO TABS: 1.0000 | ORAL_TABLET | Freq: Two times a day (BID) | ORAL | 0 refills | Status: DC

## 2021-10-15 MED ORDER — IBUPROFEN 800 MG PO TABS
800.0000 mg | ORAL_TABLET | Freq: Once | ORAL | Status: AC
  Administered 2021-10-15: 800 mg via ORAL
  Filled 2021-10-15: qty 1

## 2021-10-15 NOTE — ED Notes (Signed)
Pt states understanding of dc instructions, importance of follow up, and prescription. Pt denies questions or concerns upon dc. Pt declined wheelchair assistance upon dc. Pt ambulated out of ed w/ steady gait. No belongings left in room upon dc.  

## 2021-10-15 NOTE — ED Triage Notes (Signed)
  Patient comes in with dental pain that started yesterday afternoon after eating a popsicle.  Patient states this is a known dental issue and has been trying to see an oral surgeon for extraction.  Patient endorses dysphagia and swelling on R side of jaw.  Taking motrin with last dose 400 mg at 2000 last night.  Pain 9/10, throbbing.  Top right molar area.

## 2021-10-15 NOTE — Discharge Instructions (Signed)
Return to the ED with any new symptoms  Please follow-up with the list of oral surgeons are provided.  Please make an appointment to be seen. Please pick up prescription Augmentin I have sent in for you Please continue taking ibuprofen and Tylenol alternating for pain relief.  If you begin developing any blood in your stool or excessive abdominal pain please stop taking ibuprofen immediately. Please read the attached informational guide concerning dental pain

## 2021-10-15 NOTE — ED Provider Notes (Signed)
[place] DEPT Provider Note   CSN: [id number] Arrival date & time: 10/15/21  4008     History  Chief Complaint  Patient presents with   Dental Pain    [name] is a 36 y.o. female with history of asthma, diabetes.  Patient presents to the ED for evaluation of dental pain on the upper right side of her mouth.  Patient states that this dental pain began yesterday after eating a popsicle.  On chart view, it appears that this patient has been seen for the same dental pain on 06/06/2021 of this year.  Patient was given prescription of clindamycin at this time and advised to follow-up with oral surgeon.  Patient reports that she has been unable to follow-up with oral surgeon since this time.  Patient states that she does not have any immediate plans to have tooth removed, states that she wants to wait till after she goes on vacation in 2 weeks.  Patient denies any fevers, nausea, vomiting, inability to swallow, drooling, voice change, sore throat.   Dental Pain Associated symptoms: no drooling        Home Medications Prior to Admission medications   Medication Sig Start Date End Date Taking? Authorizing Provider  amoxicillin-clavulanate (AUGMENTIN) 875-125 MG tablet Take 1 tablet by mouth every 12 (twelve) hours. 10/15/21  Yes Azucena Cecil, PA-C  fluconazole (DIFLUCAN) 150 MG tablet Take 1 tablet (150 mg total) by mouth daily for 1 day. 10/15/21 10/16/21 Yes Azucena Cecil, PA-C  albuterol (PROVENTIL HFA;VENTOLIN HFA) 108 (90 Base) MCG/ACT inhaler Inhale 1-2 puffs into the lungs every 6 (six) hours as needed for wheezing or shortness of breath. Patient taking differently: Inhale 2 puffs into the lungs every 6 (six) hours as needed for wheezing or shortness of breath. 08/16/17   Doristine Devoid, PA-C  albuterol (PROVENTIL) (5 MG/ML) 0.5% nebulizer solution Take 0.5 mLs (2.5 mg total) by nebulization every 6 (six) hours as needed for  wheezing or shortness of breath. Patient not taking: Reported on 07/27/2021 08/16/17   Ocie Cornfield T, PA-C  Blood Glucose Monitoring Suppl (TRUE METRIX METER) w/Device KIT 1 each by Does not apply route 4 (four) times daily -  before meals and at bedtime. 12/09/15   Tresa Garter, MD  cyclobenzaprine (FLEXERIL) 10 MG tablet Take 1 tablet (10 mg total) by mouth 3 (three) times daily as needed for muscle spasms. 08/06/21   Montine Circle, PA-C  glucose blood (TRUE METRIX BLOOD GLUCOSE TEST) test strip Use as instructed 12/09/15   Tresa Garter, MD  metFORMIN (GLUCOPHAGE) 500 MG tablet Take 1 tablet (500 mg total) by mouth 2 (two) times daily with a meal. 02/25/20 07/28/22  Dorie Rank, MD  Multiple Vitamin (MULTIVITAMIN ADULT PO) Take 1 tablet by mouth daily.    [provider]  TRUEPLUS LANCETS 28G MISC 1 each by Does not apply route 4 (four) times daily - after meals and at bedtime. 12/09/15   Tresa Garter, MD      Allergies    Other, Shellfish-derived products, and Cefdinir    Review of Systems   Review of Systems  HENT:  Positive for dental problem. Negative for drooling, sore throat, trouble swallowing and voice change.   Gastrointestinal:  Negative for nausea and vomiting.  All other systems reviewed and are negative.   Physical Exam Updated Vital Signs BP 124/87   Pulse 88   Temp 98 F (36.7 C) (Oral)   Resp  16   Ht _0  (1.575 m) Comment: Simultaneous filing. User may not have seen previous data.  Wt 80.3 kg Comment: Simultaneous filing. User may not have seen previous data.  LMP 09/27/2021   SpO2 100%   BMI 32.37 kg/m  Physical Exam Vitals and nursing note reviewed.  Constitutional:      General: She is not in acute distress.    Appearance: Normal appearance. She is not ill-appearing, toxic-appearing or diaphoretic.  HENT:     Head: Normocephalic and atraumatic.     Nose: Nose normal. No congestion.     Mouth/Throat:     Mouth: Mucous  membranes are moist.     Pharynx: Oropharynx is clear.     Comments: Chipped to the gumline second molar on right upper side of mouth.  Dental caries present.  No obvious abscess in need of drainage.  Patient airway is intact, no drooling, no trismus. Eyes:     Extraocular Movements: Extraocular movements intact.     Conjunctiva/sclera: Conjunctivae normal.     Pupils: Pupils are equal, round, and reactive to light.  Cardiovascular:     Rate and Rhythm: Normal rate and regular rhythm.  Pulmonary:     Effort: Pulmonary effort is normal.     Breath sounds: Normal breath sounds.  Abdominal:     General: Abdomen is flat. Bowel sounds are normal.     Palpations: Abdomen is soft.     Tenderness: There is no abdominal tenderness.  Musculoskeletal:     Cervical back: Normal range of motion and neck supple. No tenderness.  Skin:    General: Skin is warm and dry.     Capillary Refill: Capillary refill takes less than 2 seconds.  Neurological:     Mental Status: She is alert and oriented to person, place, and time.     ED Results / Procedures / Treatments   Labs (all labs ordered are listed, but only abnormal results are displayed) Labs Reviewed - No data to display  EKG None  Radiology No results found.  Procedures Procedures   Medications Ordered in ED Medications  ibuprofen (ADVIL) tablet 800 mg (has no administration in time range)    ED Course/ Medical Decision Making/ A&P                           Medical Decision Making Risk Prescription drug management.   36 year old female presents to the ED for evaluation of dental pain.  Please see HPI for further details.  On examination, the patient's airway is intact.  Patient's oxygen saturation is 100% on room air.  The patient is not drooling, she has not potato voice, she has no trismus.  The patient is nontoxic in appearance.  On inspection of patient oropharynx, there is no appreciable abscess in need of drainage.   There is no sign of RPA, PTA.  The patient denies a sore throat. The patient is afebrile.  Patient provided with ibuprofen, list of dental resources in the area, Augmentin prescription.  Patient encouraged to have tooth removed prior to her vacation in 2 weeks which she stated was original plan.  Patient given return precautions and she voiced understanding.  Patient had all of her questions answered to her satisfaction.  Patient stable for discharge at this time.   Final Clinical Impression(s) / ED Diagnoses Final diagnoses:  Tooth pain    Rx / DC Orders ED Discharge Orders  Ordered    fluconazole (DIFLUCAN) 150 MG tablet  Daily        10/15/21 0708    amoxicillin-clavulanate (AUGMENTIN) 875-125 MG tablet  Every 12 hours        10/15/21 0708              Azucena Cecil, PA-C 10/15/21 1278    Tegeler, Gwenyth Allegra, MD 10/15/21 [phone]

## 2021-10-17 ENCOUNTER — Other Ambulatory Visit: Payer: Self-pay

## 2021-10-17 ENCOUNTER — Encounter (HOSPITAL_COMMUNITY): Payer: Self-pay | Admitting: Emergency Medicine

## 2021-10-17 ENCOUNTER — Emergency Department (HOSPITAL_COMMUNITY)
Admission: EM | Admit: 2021-10-17 | Discharge: 2021-10-17 | Disposition: A | Discharge status: Home / Self Care | Payer: 59 | Attending: Emergency Medicine | Admitting: Emergency Medicine

## 2021-10-17 DIAGNOSIS — Z5321 Procedure and treatment not carried out due to patient leaving prior to being seen by health care provider: Secondary | ICD-10-CM | POA: Diagnosis not present

## 2021-10-17 DIAGNOSIS — R069 Unspecified abnormalities of breathing: Secondary | ICD-10-CM | POA: Insufficient documentation

## 2021-10-17 DIAGNOSIS — K0889 Other specified disorders of teeth and supporting structures: Secondary | ICD-10-CM | POA: Insufficient documentation

## 2021-10-17 NOTE — ED Notes (Signed)
After finishing triage patient states she feels much better and does not want to be seen any longer.

## 2021-10-17 NOTE — ED Triage Notes (Signed)
Per [facility] pt was cleaning with clorox about 45 mins ago. Started having trouble breathing and used her inhaler once. Patient has not taken her metformin in 2 days due to tooth ache.

## 2021-12-08 ENCOUNTER — Emergency Department (HOSPITAL_COMMUNITY)
Admission: EM | Admit: 2021-12-08 | Discharge: 2021-12-08 | Disposition: A | Discharge status: Home / Self Care | Payer: Commercial Managed Care - HMO | Attending: Emergency Medicine | Admitting: Emergency Medicine

## 2021-12-08 ENCOUNTER — Encounter (HOSPITAL_COMMUNITY): Payer: Self-pay

## 2021-12-08 DIAGNOSIS — X58XXXA Exposure to other specified factors, initial encounter: Secondary | ICD-10-CM | POA: Diagnosis not present

## 2021-12-08 DIAGNOSIS — E119 Type 2 diabetes mellitus without complications: Secondary | ICD-10-CM | POA: Diagnosis not present

## 2021-12-08 DIAGNOSIS — S40912A Unspecified superficial injury of left shoulder, initial encounter: Secondary | ICD-10-CM | POA: Diagnosis present

## 2021-12-08 DIAGNOSIS — S46912A Strain of unspecified muscle, fascia and tendon at shoulder and upper arm level, left arm, initial encounter: Secondary | ICD-10-CM | POA: Insufficient documentation

## 2021-12-08 DIAGNOSIS — M791 Myalgia, unspecified site: Secondary | ICD-10-CM | POA: Insufficient documentation

## 2021-12-08 DIAGNOSIS — J45909 Unspecified asthma, uncomplicated: Secondary | ICD-10-CM | POA: Diagnosis not present

## 2021-12-08 DIAGNOSIS — T148XXA Other injury of unspecified body region, initial encounter: Secondary | ICD-10-CM

## 2021-12-08 MED ORDER — NAPROXEN 500 MG PO TABS: 500.0000 mg | ORAL_TABLET | Freq: Two times a day (BID) | ORAL | 0 refills | Status: AC

## 2021-12-08 MED ORDER — NAPROXEN 500 MG PO TABS
500.0000 mg | ORAL_TABLET | Freq: Once | ORAL | Status: AC
  Administered 2021-12-08: 500 mg via ORAL
  Filled 2021-12-08: qty 1

## 2021-12-08 MED ORDER — METHOCARBAMOL 500 MG PO TABS: 500.0000 mg | ORAL_TABLET | Freq: Three times a day (TID) | ORAL | 0 refills | Status: AC | PRN

## 2021-12-08 NOTE — Discharge Instructions (Addendum)
You were evaluated in the Emergency Department and after careful evaluation, we did not find any emergent condition requiring admission or further testing in the hospital.  Your exam/testing today is overall reassuring.  Symptoms seem to be due to a muscle strain or spasm.  Recommend heating pads, using the Naprosyn twice daily for pain, can use the Robaxin for more significant pain.  Please return to the Emergency Department if you experience any worsening of your condition.   Thank you for allowing [place] to be a part of your care.

## 2021-12-08 NOTE — ED Provider Notes (Signed)
[facility]-EMERGENCY DEPT [facility] Emergency Department Provider Note MRN:  [id number]  Arrival date & time: 12/08/21     Chief Complaint   Muscle Pain   History of Present Illness   [name] is a 36 y.o. year-old female with a history of diabetes presenting to the ED with chief complaint of muscle pain.  Pain to the left trapezius/shoulder blade, present for several days, worse with motion or palpation, movement of the arm.  Denies trauma.  No numbness or weakness to the arms or legs, no bowel or bladder dysfunction, no chest pain or shortness of breath.  Review of Systems  A thorough review of systems was obtained and all systems are negative except as noted in the HPI and PMH.   Patient's Health History    Past Medical History:  Diagnosis Date   Asthma    Diabetes mellitus without complication (HCC)    Obesity     Past Surgical History:  Procedure Laterality Date   CESAREAN SECTION     TUBAL LIGATION      Family History  Problem Relation Age of Onset   Diabetes Maternal Aunt     Social History   Socioeconomic History   Marital status: Single    Spouse name: Not on file   Number of children: Not on file   Years of education: Not on file   Highest education level: Not on file  Occupational History   Not on file  Tobacco Use   Smoking status: Never   Smokeless tobacco: Never  Vaping Use   Vaping Use: Never used  Substance and Sexual Activity   Alcohol use: No   Drug use: No   Sexual activity: Yes    Birth control/protection: None  Other Topics Concern   Not on file  Social History Narrative   Not on file   Social Determinants of Health   Financial Resource Strain: Not on file  Food Insecurity: Not on file  Transportation Needs: Not on file  Physical Activity: Not on file  Stress: Not on file  Social Connections: Not on file  Intimate Partner Violence: Not on file     Physical Exam   Vitals:   12/08/21 0542  BP: 123/81  Pulse: 60   Resp: 16  Temp: 97.7 F (36.5 C)  SpO2: 100%    CONSTITUTIONAL: Well-appearing, NAD NEURO/PSYCH:  Alert and oriented x 3, no focal deficits EYES:  eyes equal and reactive ENT/NECK:  no LAD, no JVD CARDIO: Regular rate, well-perfused, normal S1 and S2 PULM:  CTAB no wheezing or rhonchi GI/GU:  non-distended, non-tender MSK/SPINE:  No gross deformities, no edema SKIN:  no rash, atraumatic   *Additional and/or pertinent findings included in MDM below  Diagnostic and Interventional Summary    EKG Interpretation  Date/Time:    Ventricular Rate:    PR Interval:    QRS Duration:   QT Interval:    QTC Calculation:   R Axis:     Text Interpretation:         Labs Reviewed - No data to display  No orders to display    Medications - No data to display   Procedures  /  Critical Care Procedures  ED Course and Medical Decision Making  Initial Impression and Ddx Reproducible pain to the left shoulder blade/trapezius, seems very consistent with MSK pain.  Lungs clear, doubt pneumothorax.  Appropriate for discharge with symptomatic management.  Past medical/surgical history that increases complexity of ED encounter:  None  Interpretation of Diagnostics Laboratory and/or imaging options to aid in the diagnosis/care of the patient were considered.  After careful history and physical examination, it was determined that there was no indication for diagnostics at this time.  Patient Reassessment and Ultimate Disposition/Management     Discharge  Patient management required discussion with the following services or consulting groups:  None  Complexity of Problems Addressed Acute illness or injury that poses threat of life of bodily function  Additional Data Reviewed and Analyzed Further history obtained from: None  Additional Factors Impacting ED Encounter Risk Prescriptions  Elmer Sow. Pilar Plate, MD [facility] Emergency Medicine [facility]  Health [email or web address]  Final Clinical Impressions(s) / ED Diagnoses     ICD-10-CM   1. Muscle strain  T14.8XXA       ED Discharge Orders          Ordered    methocarbamol (ROBAXIN) 500 MG tablet  Every 8 hours PRN        12/08/21 0652    naproxen (NAPROSYN) 500 MG tablet  2 times daily        12/08/21 0109             Discharge Instructions Discussed with and Provided to Patient:    Discharge Instructions      You were evaluated in the Emergency Department and after careful evaluation, we did not find any emergent condition requiring admission or further testing in the hospital.  Your exam/testing today is overall reassuring.  Symptoms seem to be due to a muscle strain or spasm.  Recommend heating pads, using the Naprosyn twice daily for pain, can use the Robaxin for more significant pain.  Please return to the Emergency Department if you experience any worsening of your condition.   Thank you for allowing [place] to be a part of your care.      Sabas Sous, MD 12/08/21 [phone]

## 2021-12-08 NOTE — ED Triage Notes (Signed)
Patient arrived with complaints of a knot in her left shoulder/neck that started yesterday. Some relief with OTC medication. No known injury.

## 2021-12-11 ENCOUNTER — Other Ambulatory Visit: Payer: Self-pay

## 2021-12-11 ENCOUNTER — Emergency Department (HOSPITAL_COMMUNITY): Payer: Commercial Managed Care - HMO

## 2021-12-11 ENCOUNTER — Emergency Department (HOSPITAL_COMMUNITY)
Admission: EM | Admit: 2021-12-11 | Discharge: 2021-12-11 | Disposition: A | Discharge status: Home / Self Care | Payer: Commercial Managed Care - HMO | Attending: Emergency Medicine | Admitting: Emergency Medicine

## 2021-12-11 ENCOUNTER — Encounter (HOSPITAL_COMMUNITY): Payer: Self-pay

## 2021-12-11 DIAGNOSIS — R748 Abnormal levels of other serum enzymes: Secondary | ICD-10-CM | POA: Insufficient documentation

## 2021-12-11 DIAGNOSIS — E1165 Type 2 diabetes mellitus with hyperglycemia: Secondary | ICD-10-CM | POA: Diagnosis not present

## 2021-12-11 DIAGNOSIS — J45909 Unspecified asthma, uncomplicated: Secondary | ICD-10-CM | POA: Diagnosis not present

## 2021-12-11 DIAGNOSIS — Z7984 Long term (current) use of oral hypoglycemic drugs: Secondary | ICD-10-CM | POA: Insufficient documentation

## 2021-12-11 DIAGNOSIS — K2900 Acute gastritis without bleeding: Secondary | ICD-10-CM | POA: Insufficient documentation

## 2021-12-11 DIAGNOSIS — K219 Gastro-esophageal reflux disease without esophagitis: Secondary | ICD-10-CM | POA: Diagnosis not present

## 2021-12-11 DIAGNOSIS — R1013 Epigastric pain: Secondary | ICD-10-CM | POA: Diagnosis present

## 2021-12-11 LAB — CBC WITH DIFFERENTIAL/PLATELET
Abs Immature Granulocytes: 0.01 10*3/uL (ref 0.00–0.07)
Basophils Absolute: 0 10*3/uL (ref 0.0–0.1)
Basophils Relative: 0 %
Eosinophils Absolute: 0.2 10*3/uL (ref 0.0–0.5)
Eosinophils Relative: 3 %
HCT: 38.2 % (ref 36.0–46.0)
Hemoglobin: 12.2 g/dL (ref 12.0–15.0)
Immature Granulocytes: 0 %
Lymphocytes Relative: 33 %
Lymphs Abs: 1.7 10*3/uL (ref 0.7–4.0)
MCH: 24 pg — ABNORMAL LOW (ref 26.0–34.0)
MCHC: 31.9 g/dL (ref 30.0–36.0)
MCV: 75 fL — ABNORMAL LOW (ref 80.0–100.0)
Monocytes Absolute: 0.3 10*3/uL (ref 0.1–1.0)
Monocytes Relative: 6 %
Neutro Abs: 3 10*3/uL (ref 1.7–7.7)
Neutrophils Relative %: 58 %
Platelets: 254 10*3/uL (ref 150–400)
RBC: 5.09 MIL/uL (ref 3.87–5.11)
RDW: 14.9 % (ref 11.5–15.5)
WBC: 5.2 10*3/uL (ref 4.0–10.5)
nRBC: 0 % (ref 0.0–0.2)

## 2021-12-11 LAB — URINALYSIS, ROUTINE W REFLEX MICROSCOPIC
Bacteria, UA: NONE SEEN
Bilirubin Urine: NEGATIVE
Glucose, UA: >=500 mg/dL — AB
Hgb urine dipstick: NEGATIVE
Ketones, ur: NEGATIVE mg/dL
Nitrite: NEGATIVE
Protein, ur: NEGATIVE mg/dL
Specific Gravity, Urine: 1.021 (ref 1.005–1.030)
pH: 7 (ref 5.0–8.0)

## 2021-12-11 LAB — COMPREHENSIVE METABOLIC PANEL
ALT: 15 U/L (ref 0–44)
AST: 14 U/L — ABNORMAL LOW (ref 15–41)
Albumin: 3.4 g/dL — ABNORMAL LOW (ref 3.5–5.0)
Alkaline Phosphatase: 81 U/L (ref 38–126)
Anion gap: 7 (ref 5–15)
BUN: 13 mg/dL (ref 6–20)
CO2: 23 mmol/L (ref 22–32)
Calcium: 9.2 mg/dL (ref 8.9–10.3)
Chloride: 106 mmol/L (ref 98–111)
Creatinine, Ser: 0.77 mg/dL (ref 0.44–1.00)
GFR, Estimated: >60 mL/min (ref >60)
Glucose, Bld: 384 mg/dL — ABNORMAL HIGH (ref 70–99)
Potassium: 4 mmol/L (ref 3.5–5.1)
Sodium: 136 mmol/L (ref 135–145)
Total Bilirubin: 0.4 mg/dL (ref 0.3–1.2)
Total Protein: 7.6 g/dL (ref 6.5–8.1)

## 2021-12-11 LAB — MAGNESIUM: Magnesium: 1.9 mg/dL (ref 1.7–2.4)

## 2021-12-11 LAB — PREGNANCY, URINE: Preg Test, Ur: NEGATIVE

## 2021-12-11 LAB — LIPASE, BLOOD: Lipase: 56 U/L — ABNORMAL HIGH (ref 11–51)

## 2021-12-11 LAB — TROPONIN I (HIGH SENSITIVITY): Troponin I (High Sensitivity): 2 ng/L (ref <18)

## 2021-12-11 MED ORDER — LIDOCAINE VISCOUS HCL 2 % MT SOLN
15.0000 mL | Freq: Once | OROMUCOSAL | Status: AC
  Administered 2021-12-11: 15 mL via ORAL
  Filled 2021-12-11: qty 15

## 2021-12-11 MED ORDER — FAMOTIDINE 20 MG PO TABS: 20.0000 mg | ORAL_TABLET | Freq: Two times a day (BID) | ORAL | 0 refills | Status: AC

## 2021-12-11 MED ORDER — ONDANSETRON HCL 4 MG/2ML IJ SOLN
4.0000 mg | Freq: Once | INTRAMUSCULAR | Status: AC
  Administered 2021-12-11: 4 mg via INTRAVENOUS
  Filled 2021-12-11: qty 2

## 2021-12-11 MED ORDER — LACTATED RINGERS IV BOLUS
500.0000 mL | Freq: Once | INTRAVENOUS | Status: AC
  Administered 2021-12-11: 500 mL via INTRAVENOUS

## 2021-12-11 MED ORDER — SUCRALFATE 1 GM/10ML PO SUSP: 1.0000 g | Freq: Two times a day (BID) | ORAL | 0 refills | Status: AC

## 2021-12-11 MED ORDER — FAMOTIDINE IN NACL 20-0.9 MG/50ML-% IV SOLN
20.0000 mg | Freq: Once | INTRAVENOUS | Status: AC
Start: 2021-12-11 — End: 2021-12-11
  Administered 2021-12-11: 20 mg via INTRAVENOUS
  Filled 2021-12-11: qty 50

## 2021-12-11 MED ORDER — ONDANSETRON 4 MG PO TBDP: 4.0000 mg | ORAL_TABLET | Freq: Three times a day (TID) | ORAL | 0 refills | Status: AC | PRN

## 2021-12-11 MED ORDER — ALUM & MAG HYDROXIDE-SIMETH 200-200-20 MG/5ML PO SUSP
30.0000 mL | Freq: Once | ORAL | Status: AC
  Administered 2021-12-11: 30 mL via ORAL
  Filled 2021-12-11: qty 30

## 2021-12-11 NOTE — ED Notes (Signed)
Pt states understanding of dc instructions, importance of follow up, work note, and prescriptions. Pt denies questions or concerns upon dc. Pt declined wheelchair assistance upon dc. Pt ambulated out of ed w/ steady gait. No belongings left in room upon dc.  

## 2021-12-11 NOTE — Discharge Instructions (Addendum)
Medications were sent to your pharmacy.  Famotidine is a medication to lower the acid content of your stomach.  Take this as prescribed.  Sucralfate is a medication that can soothe the stomach.  Take this as needed if it helps your symptoms.  Zofran is a medication that treats nausea.  Take this only as needed.  There is a telephone number to call for follow-up with a gastroenterologist.  Schedule follow-up as needed if you have persistent symptoms.  If you are able to follow-up with a primary care doctor or with the gastroenterologist, discuss long-term use of medications.  If you develop any new or worsening symptoms of concern, please return to the emergency department.

## 2021-12-11 NOTE — ED Provider Notes (Signed)
[facility] DEPT Provider Note   CSN: [id number] Arrival date & time: 12/11/21  3845     History  Chief Complaint  Patient presents with   Abdominal Pain    [name] is a 36 y.o. female.   Abdominal Pain Associated symptoms: chest pain and nausea   Patient presents for epigastric burning.  Medical history includes asthma, DM, obesity.  She was seen in the ED 3 days ago for muscle pain to her left shoulder and trapezius.  She was prescribed Robaxin and Naprosyn.  Prior to that, she was taken over-the-counter ibuprofen.  Today she reports epigastric pain, burning sensation in chest, and nausea since yesterday.  Symptoms are worse after eating.  They also worsened overnight while laying down she denies any other new symptoms.  Shoulder pain has improved.     Home Medications Prior to Admission medications   Medication Sig Start Date End Date Taking? Authorizing Provider  famotidine (PEPCID) 20 MG tablet Take 1 tablet (20 mg total) by mouth 2 (two) times daily. 12/11/21  Yes Godfrey Pick, MD  ondansetron (ZOFRAN-ODT) 4 MG disintegrating tablet Take 1 tablet (4 mg total) by mouth every 8 (eight) hours as needed for nausea or vomiting. 12/11/21  Yes Godfrey Pick, MD  sucralfate (CARAFATE) 1 GM/10ML suspension Take 10 mLs (1 g total) by mouth 2 (two) times daily for 21 days. 12/11/21 01/01/22 Yes Godfrey Pick, MD  albuterol (PROVENTIL HFA;VENTOLIN HFA) 108 (90 Base) MCG/ACT inhaler Inhale 1-2 puffs into the lungs every 6 (six) hours as needed for wheezing or shortness of breath. Patient taking differently: Inhale 2 puffs into the lungs every 6 (six) hours as needed for wheezing or shortness of breath. 08/16/17   Doristine Devoid, PA-C  albuterol (PROVENTIL) (5 MG/ML) 0.5% nebulizer solution Take 0.5 mLs (2.5 mg total) by nebulization every 6 (six) hours as needed for wheezing or shortness of breath. Patient not taking: Reported on 07/27/2021 08/16/17   Ocie Cornfield T, PA-C  amoxicillin-clavulanate (AUGMENTIN) 875-125 MG tablet Take 1 tablet by mouth every 12 (twelve) hours. 10/15/21   Azucena Cecil, PA-C  Blood Glucose Monitoring Suppl (TRUE METRIX METER) w/Device KIT 1 each by Does not apply route 4 (four) times daily -  before meals and at bedtime. 12/09/15   Tresa Garter, MD  cyclobenzaprine (FLEXERIL) 10 MG tablet Take 1 tablet (10 mg total) by mouth 3 (three) times daily as needed for muscle spasms. 08/06/21   Montine Circle, PA-C  glucose blood (TRUE METRIX BLOOD GLUCOSE TEST) test strip Use as instructed 12/09/15   Tresa Garter, MD  metFORMIN (GLUCOPHAGE) 500 MG tablet Take 1 tablet (500 mg total) by mouth 2 (two) times daily with a meal. 02/25/20 07/28/22  Dorie Rank, MD  methocarbamol (ROBAXIN) 500 MG tablet Take 1 tablet (500 mg total) by mouth every 8 (eight) hours as needed for muscle spasms. 12/08/21   Maudie Flakes, MD  Multiple Vitamin (MULTIVITAMIN ADULT PO) Take 1 tablet by mouth daily.    [provider]  naproxen (NAPROSYN) 500 MG tablet Take 1 tablet (500 mg total) by mouth 2 (two) times daily. 12/08/21   Maudie Flakes, MD  TRUEPLUS LANCETS 28G MISC 1 each by Does not apply route 4 (four) times daily - after meals and at bedtime. 12/09/15   Tresa Garter, MD      Allergies    Other, Shellfish-derived products, and Cefdinir    Review of Systems  Review of Systems  Cardiovascular:  Positive for chest pain.  Gastrointestinal:  Positive for abdominal pain and nausea.  All other systems reviewed and are negative.   Physical Exam Updated Vital Signs BP 124/76 (BP Location: Left Arm)   Pulse 65   Temp 98.8 F (37.1 C) (Oral)   Resp 18   Ht '5\' 2"'  (1.575 m)   Wt 77.1 kg   SpO2 100%   BMI 31.09 kg/m  Physical Exam Vitals and nursing note reviewed.  Constitutional:      General: She is not in acute distress.    Appearance: She is well-developed. She is not ill-appearing, toxic-appearing or  diaphoretic.  HENT:     Head: Normocephalic and atraumatic.     Mouth/Throat:     Mouth: Mucous membranes are moist.     Pharynx: Oropharynx is clear.  Eyes:     General: No scleral icterus.    Extraocular Movements: Extraocular movements intact.     Conjunctiva/sclera: Conjunctivae normal.  Cardiovascular:     Rate and Rhythm: Normal rate and regular rhythm.     Heart sounds: No murmur heard. Pulmonary:     Effort: Pulmonary effort is normal. No respiratory distress.     Breath sounds: Normal breath sounds.  Abdominal:     Palpations: Abdomen is soft.     Tenderness: There is abdominal tenderness (Mild) in the epigastric area. There is no guarding or rebound.  Musculoskeletal:        General: No swelling.     Cervical back: Neck supple.  Skin:    General: Skin is warm and dry.     Coloration: Skin is not cyanotic, jaundiced or pale.  Neurological:     General: No focal deficit present.     Mental Status: She is alert and oriented to person, place, and time.  Psychiatric:        Mood and Affect: Mood normal.        Behavior: Behavior normal.     ED Results / Procedures / Treatments   Labs (all labs ordered are listed, but only abnormal results are displayed) Labs Reviewed  COMPREHENSIVE METABOLIC PANEL - Abnormal; Notable for the following components:      Result Value   Glucose, Bld 384 (*)    Albumin 3.4 (*)    AST 14 (*)    All other components within normal limits  CBC WITH DIFFERENTIAL/PLATELET - Abnormal; Notable for the following components:   MCV 75.0 (*)    MCH 24.0 (*)    All other components within normal limits  LIPASE, BLOOD - Abnormal; Notable for the following components:   Lipase 56 (*)    All other components within normal limits  URINALYSIS, ROUTINE W REFLEX MICROSCOPIC - Abnormal; Notable for the following components:   Color, Urine STRAW (*)    Glucose, UA >=500 (*)    Leukocytes,Ua MODERATE (*)    All other components within normal limits   MAGNESIUM  PREGNANCY, URINE  TROPONIN I (HIGH SENSITIVITY)    EKG None  Radiology DG Chest Portable 1 View  Result Date: 12/11/2021 CLINICAL DATA:  Short of breath. EXAM: PORTABLE CHEST 1 VIEW COMPARISON:  07/22/2020 FINDINGS: Heart size and mediastinal contours are unremarkable. Low lung volumes. There is no pleural effusion or edema. No airspace opacities identified. Visualized osseous structures are unremarkable. IMPRESSION: Low lung volumes.  No acute findings. Electronically Signed   By: Kerby Moors M.D.   On: 12/11/2021 07:56  Procedures Procedures    Medications Ordered in ED Medications  lactated ringers bolus 500 mL (0 mLs Intravenous Stopped 12/11/21 0803)  alum & mag hydroxide-simeth (MAALOX/MYLANTA) 200-200-20 MG/5ML suspension 30 mL (30 mLs Oral Given 12/11/21 0727)    And  lidocaine (XYLOCAINE) 2 % viscous mouth solution 15 mL (15 mLs Oral Given 12/11/21 0727)  famotidine (PEPCID) IVPB 20 mg premix (0 mg Intravenous Stopped 12/11/21 0757)  ondansetron (ZOFRAN) injection 4 mg (4 mg Intravenous Given 12/11/21 6144)    ED Course/ Medical Decision Making/ A&P                           Medical Decision Making Amount and/or Complexity of Data Reviewed Labs: ordered. Radiology: ordered.  Risk OTC drugs. Prescription drug management.   This patient presents to the ED for concern of epigastric pain, this involves an extensive number of treatment options, and is a complaint that carries with it a high risk of complications and morbidity.  The differential diagnosis includes gastritis, GERD, pancreatitis, cholecystitis, cholelithiasis, hepatitis   Co morbidities that complicate the patient evaluation  asthma, DM, obesity   Additional history obtained:  Additional history obtained from N/A External records from outside source obtained and reviewed including EMR   Lab Tests:  I Ordered, and personally interpreted labs.  The pertinent results include: Hyperglycemia  without evidence of DKA, normal hemoglobin, no leukocytosis, slightly elevated lipase, normal troponin, normal hepatobiliary enzymes   Imaging Studies ordered:  I ordered imaging studies including chest x-ray I independently visualized and interpreted imaging which showed no acute findings I agree with the radiologist interpretation   Cardiac Monitoring: / EKG:  The patient was maintained on a cardiac monitor.  I personally viewed and interpreted the cardiac monitored which showed an underlying rhythm of: Sinus rhythm  Problem List / ED Course / Critical interventions / Medication management  Patient presents for burning sensation in her epigastrium that radiates into her chest.  The symptoms are worsened postprandially and did worsen overnight while laying down.  On arrival in the ED, patient has normal vital signs and is well-appearing on exam.  She endorses mild symptoms at this time.  Her abdomen is soft with only mild tenderness to area of epigastrium.  Lungs are clear to auscultation.  Zofran and GI cocktail were ordered for symptomatic relief.  Laboratory workup was initiated.  Lab results are reassuring.  Patient's lipase is only mildly elevated and not consistent with acute pancreatitis.  On reassessment, she reports near resolution of symptoms following GI cocktail.  I suspect patient's symptoms are secondary to gastritis/GERD.  Risk factors include recent NSAID use.  Patient was advised to discontinue NSAID use.  Currently, she does not smoke or drink alcohol.  She was prescribed Pepcid, Carafate, and as needed Zofran.  She was advised to follow-up with her primary care doctor and/your gastroenterologist for further assessment and to discuss long-term use of acid blocking medications.  She was discharged in good condition. I ordered medication including Zofran, GI cocktail, IV fluids for symptomatic relief Reevaluation of the patient after these medicines showed that the patient  improved I have reviewed the patients home medicines and have made adjustments as needed   Social Determinants of Health:  Does not currently have a PCP         Final Clinical Impression(s) / ED Diagnoses Final diagnoses:  Gastroesophageal reflux disease, unspecified whether esophagitis present  Acute gastritis without hemorrhage, unspecified gastritis  type    Rx / DC Orders ED Discharge Orders          Ordered    famotidine (PEPCID) 20 MG tablet  2 times daily        12/11/21 1011    sucralfate (CARAFATE) 1 GM/10ML suspension  2 times daily        12/11/21 1011    ondansetron (ZOFRAN-ODT) 4 MG disintegrating tablet  Every 8 hours PRN        12/11/21 1011              Godfrey Pick, MD 12/12/21 [id number]

## 2021-12-11 NOTE — ED Triage Notes (Addendum)
Patient said she began having epigastric abdominal pain since yesterday. She said she feels constantly nauseous. Has not vomited yet. Stated she just started taking naproxen and ever since she began taking this it has been harder to breathe.

## 2022-01-30 ENCOUNTER — Ambulatory Visit: Payer: Commercial Managed Care - HMO | Admitting: Nurse Practitioner

## 2022-01-30 ENCOUNTER — Telehealth: Payer: Self-pay | Admitting: Nurse Practitioner

## 2022-01-30 NOTE — Progress Notes (Deleted)
New Patient Visit  There were no vitals taken for this visit.   Subjective:    Patient ID: [name], female    DOB: [birth year], 36 y.o.   MRN: [id number]  CC: No chief complaint on file.   HPI: [name] is a 36 y.o. female presents for new patient visit to establish care.  Introduced to Designer, jewellery role and practice setting.  All questions answered.  Discussed provider/patient relationship and expectations.   Past Medical History:  Diagnosis Date   Asthma    Diabetes mellitus without complication ([place])    Obesity     Past Surgical History:  Procedure Laterality Date   CESAREAN SECTION     TUBAL LIGATION      Family History  Problem Relation Age of Onset   Diabetes Maternal Aunt      Social History   Tobacco Use   Smoking status: Never   Smokeless tobacco: Never  Vaping Use   Vaping Use: Never used  Substance Use Topics   Alcohol use: No   Drug use: No    Current Outpatient Medications on File Prior to Visit  Medication Sig Dispense Refill   albuterol (PROVENTIL HFA;VENTOLIN HFA) 108 (90 Base) MCG/ACT inhaler Inhale 1-2 puffs into the lungs every 6 (six) hours as needed for wheezing or shortness of breath. (Patient taking differently: Inhale 2 puffs into the lungs every 6 (six) hours as needed for wheezing or shortness of breath.) 1 Inhaler 1   albuterol (PROVENTIL) (5 MG/ML) 0.5% nebulizer solution Take 0.5 mLs (2.5 mg total) by nebulization every 6 (six) hours as needed for wheezing or shortness of breath. (Patient not taking: Reported on 07/27/2021) 20 mL 12   amoxicillin-clavulanate (AUGMENTIN) 875-125 MG tablet Take 1 tablet by mouth every 12 (twelve) hours. 14 tablet 0   Blood Glucose Monitoring Suppl (TRUE METRIX METER) w/Device KIT 1 each by Does not apply route 4 (four) times daily -  before meals and at bedtime. 1 kit 0   cyclobenzaprine (FLEXERIL) 10 MG tablet Take 1 tablet (10 mg total) by mouth 3 (three) times daily as needed  for muscle spasms. 10 tablet 0   famotidine (PEPCID) 20 MG tablet Take 1 tablet (20 mg total) by mouth 2 (two) times daily. 30 tablet 0   glucose blood (TRUE METRIX BLOOD GLUCOSE TEST) test strip Use as instructed 100 each 12   metFORMIN (GLUCOPHAGE) 500 MG tablet Take 1 tablet (500 mg total) by mouth 2 (two) times daily with a meal. 60 tablet 0   methocarbamol (ROBAXIN) 500 MG tablet Take 1 tablet (500 mg total) by mouth every 8 (eight) hours as needed for muscle spasms. 30 tablet 0   Multiple Vitamin (MULTIVITAMIN ADULT PO) Take 1 tablet by mouth daily.     naproxen (NAPROSYN) 500 MG tablet Take 1 tablet (500 mg total) by mouth 2 (two) times daily. 30 tablet 0   ondansetron (ZOFRAN-ODT) 4 MG disintegrating tablet Take 1 tablet (4 mg total) by mouth every 8 (eight) hours as needed for nausea or vomiting. 20 tablet 0   sucralfate (CARAFATE) 1 GM/10ML suspension Take 10 mLs (1 g total) by mouth 2 (two) times daily for 21 days. 414 mL 0   TRUEPLUS LANCETS 28G MISC 1 each by Does not apply route 4 (four) times daily - after meals and at bedtime. 100 each 12   No current facility-administered medications on file prior to visit.     Review of Systems  Objective:    There were no vitals taken for this visit.  Wt Readings from Last 3 Encounters:  12/11/21 170 lb (77.1 kg)  10/17/21 173 lb (78.5 kg)  10/15/21 177 lb (80.3 kg)    BP Readings from Last 3 Encounters:  12/11/21 124/76  12/08/21 123/81  10/17/21 126/83    Physical Exam     Assessment & Plan:   Problem List Items Addressed This Visit   None    Follow up plan: No follow-ups on file.

## 2022-01-30 NOTE — Telephone Encounter (Signed)
Pt was a no show for a NP app with Lauren on 01/30/22, I sent a letter.

## 2022-02-01 NOTE — Telephone Encounter (Signed)
Noted  

## 2022-02-01 NOTE — Telephone Encounter (Signed)
1st no show, fee waived, letter sent 

## 2022-02-11 ENCOUNTER — Emergency Department (HOSPITAL_COMMUNITY): Payer: Commercial Managed Care - HMO

## 2022-02-11 ENCOUNTER — Encounter (HOSPITAL_COMMUNITY): Payer: Self-pay

## 2022-02-11 ENCOUNTER — Emergency Department (HOSPITAL_COMMUNITY)
Admission: EM | Admit: 2022-02-11 | Discharge: 2022-02-11 | Disposition: A | Discharge status: Home / Self Care | Payer: Commercial Managed Care - HMO | Attending: Emergency Medicine | Admitting: Emergency Medicine

## 2022-02-11 ENCOUNTER — Other Ambulatory Visit: Payer: Self-pay

## 2022-02-11 DIAGNOSIS — J45909 Unspecified asthma, uncomplicated: Secondary | ICD-10-CM | POA: Diagnosis not present

## 2022-02-11 DIAGNOSIS — E119 Type 2 diabetes mellitus without complications: Secondary | ICD-10-CM | POA: Insufficient documentation

## 2022-02-11 DIAGNOSIS — R059 Cough, unspecified: Secondary | ICD-10-CM | POA: Diagnosis present

## 2022-02-11 DIAGNOSIS — J069 Acute upper respiratory infection, unspecified: Secondary | ICD-10-CM | POA: Diagnosis not present

## 2022-02-11 DIAGNOSIS — Z7984 Long term (current) use of oral hypoglycemic drugs: Secondary | ICD-10-CM | POA: Diagnosis not present

## 2022-02-11 LAB — CBC WITH DIFFERENTIAL/PLATELET
Abs Immature Granulocytes: 0.02 10*3/uL (ref 0.00–0.07)
Basophils Absolute: 0 10*3/uL (ref 0.0–0.1)
Basophils Relative: 1 %
Eosinophils Absolute: 0.2 10*3/uL (ref 0.0–0.5)
Eosinophils Relative: 3 %
HCT: 37.9 % (ref 36.0–46.0)
Hemoglobin: 12 g/dL (ref 12.0–15.0)
Immature Granulocytes: 0 %
Lymphocytes Relative: 25 %
Lymphs Abs: 1.5 10*3/uL (ref 0.7–4.0)
MCH: 23.2 pg — ABNORMAL LOW (ref 26.0–34.0)
MCHC: 31.7 g/dL (ref 30.0–36.0)
MCV: 73.2 fL — ABNORMAL LOW (ref 80.0–100.0)
Monocytes Absolute: 0.4 10*3/uL (ref 0.1–1.0)
Monocytes Relative: 7 %
Neutro Abs: 4 10*3/uL (ref 1.7–7.7)
Neutrophils Relative %: 64 %
Platelets: 212 10*3/uL (ref 150–400)
RBC: 5.18 MIL/uL — ABNORMAL HIGH (ref 3.87–5.11)
RDW: 15 % (ref 11.5–15.5)
WBC: 6.2 10*3/uL (ref 4.0–10.5)
nRBC: 0 % (ref 0.0–0.2)

## 2022-02-11 LAB — BASIC METABOLIC PANEL
Anion gap: 6 (ref 5–15)
BUN: 13 mg/dL (ref 6–20)
CO2: 24 mmol/L (ref 22–32)
Calcium: 8.9 mg/dL (ref 8.9–10.3)
Chloride: 104 mmol/L (ref 98–111)
Creatinine, Ser: 0.76 mg/dL (ref 0.44–1.00)
GFR, Estimated: >60 mL/min (ref >60)
Glucose, Bld: 340 mg/dL — ABNORMAL HIGH (ref 70–99)
Potassium: 4.1 mmol/L (ref 3.5–5.1)
Sodium: 134 mmol/L — ABNORMAL LOW (ref 135–145)

## 2022-02-11 MED ORDER — FLUCONAZOLE 200 MG PO TABS
200.0000 mg | ORAL_TABLET | Freq: Every day | ORAL | 0 refills | Status: AC
Start: 2022-02-11 — End: 2022-02-12

## 2022-02-11 MED ORDER — IPRATROPIUM-ALBUTEROL 0.5-2.5 (3) MG/3ML IN SOLN
3.0000 mL | Freq: Once | RESPIRATORY_TRACT | Status: AC
  Administered 2022-02-11: 3 mL via RESPIRATORY_TRACT
  Filled 2022-02-11: qty 3

## 2022-02-11 MED ORDER — AZITHROMYCIN 250 MG PO TABS: 250.0000 mg | ORAL_TABLET | Freq: Every day | ORAL | 0 refills | Status: DC

## 2022-02-11 NOTE — ED Provider Triage Note (Signed)
Emergency Medicine Provider Triage Evaluation Note  [name] , a 36 y.o. female  was evaluated in triage.  Pt complains of who presents to the ED for evaluation of cough.  Patient has a history of asthma and thought this may be contributing to the cough.  Has progressively gotten worse over the past 2 weeks.  Is coughing approximately every 45 minutes.  States it did start with light green sputum production.  Has progressed to dark green sputum production.  Using her inhaler makes the cough worse.  Denies fevers, chills, sore throat, rhinorrhea, nasal congestion.  Review of Systems  Positive: As above Negative: As above  Physical Exam  BP (!) 145/71 (BP Location: Right Arm)   Pulse 73   Temp 98 F (36.7 C) (Oral)   Resp 18   SpO2 100%  Gen:   Awake, no distress   Resp:  Normal effort no wheezes, lungs CTA B MSK:   Moves extremities without difficulty  Other:    Medical Decision Making  Medically screening exam initiated at 12:50 PM.  Appropriate orders placed.  [name] was informed that the remainder of the evaluation will be completed by another provider, this initial triage assessment does not replace that evaluation, and the importance of remaining in the ED until their evaluation is complete.     Roylene Reason, [place] 02/11/22 1253

## 2022-02-11 NOTE — ED Provider Notes (Signed)
[facility] DEPT Provider Note   CSN: [id number] Arrival date & time: 02/11/22  1221     History  Chief Complaint  Patient presents with   Asthma    [name] is a 36 y.o. female with history of asthma presents to the emergency department for evaluation of cough that started about 3 weeks ago.  She has history of asthma and thinks this may be worsening her symptoms.  She endorses productive cough with dark green sputum that has progressively worsened.  Cough is worse at night.  She has been using her rescue inhaler twice daily although denies wheezes or difficulty breathing.  She denies fever, chills, sore throat and nasal congestion.   Asthma       Home Medications Prior to Admission medications   Medication Sig Start Date End Date Taking? Authorizing Provider  azithromycin (ZITHROMAX) 250 MG tablet Take 1 tablet (250 mg total) by mouth daily. Take first 2 tablets together, then 1 every day until finished. 02/11/22  Yes Kathe Becton R, PA-C  fluconazole (DIFLUCAN) 200 MG tablet Take 1 tablet (200 mg total) by mouth daily for 1 day. 02/11/22 02/12/22 Yes Kathe Becton R, PA-C  albuterol (PROVENTIL HFA;VENTOLIN HFA) 108 (90 Base) MCG/ACT inhaler Inhale 1-2 puffs into the lungs every 6 (six) hours as needed for wheezing or shortness of breath. Patient taking differently: Inhale 2 puffs into the lungs every 6 (six) hours as needed for wheezing or shortness of breath. 08/16/17   Doristine Devoid, PA-C  albuterol (PROVENTIL) (5 MG/ML) 0.5% nebulizer solution Take 0.5 mLs (2.5 mg total) by nebulization every 6 (six) hours as needed for wheezing or shortness of breath. Patient not taking: Reported on 07/27/2021 08/16/17   Ocie Cornfield T, PA-C  amoxicillin-clavulanate (AUGMENTIN) 875-125 MG tablet Take 1 tablet by mouth every 12 (twelve) hours. 10/15/21   Azucena Cecil, PA-C  Blood Glucose Monitoring Suppl (TRUE METRIX METER) w/Device KIT 1  each by Does not apply route 4 (four) times daily -  before meals and at bedtime. 12/09/15   Tresa Garter, MD  cyclobenzaprine (FLEXERIL) 10 MG tablet Take 1 tablet (10 mg total) by mouth 3 (three) times daily as needed for muscle spasms. 08/06/21   Montine Circle, PA-C  famotidine (PEPCID) 20 MG tablet Take 1 tablet (20 mg total) by mouth 2 (two) times daily. 12/11/21   Godfrey Pick, MD  glucose blood (TRUE METRIX BLOOD GLUCOSE TEST) test strip Use as instructed 12/09/15   Tresa Garter, MD  metFORMIN (GLUCOPHAGE) 500 MG tablet Take 1 tablet (500 mg total) by mouth 2 (two) times daily with a meal. 02/25/20 07/28/22  Dorie Rank, MD  methocarbamol (ROBAXIN) 500 MG tablet Take 1 tablet (500 mg total) by mouth every 8 (eight) hours as needed for muscle spasms. 12/08/21   Maudie Flakes, MD  Multiple Vitamin (MULTIVITAMIN ADULT PO) Take 1 tablet by mouth daily.    [provider]  naproxen (NAPROSYN) 500 MG tablet Take 1 tablet (500 mg total) by mouth 2 (two) times daily. 12/08/21   Maudie Flakes, MD  ondansetron (ZOFRAN-ODT) 4 MG disintegrating tablet Take 1 tablet (4 mg total) by mouth every 8 (eight) hours as needed for nausea or vomiting. 12/11/21   Godfrey Pick, MD  sucralfate (CARAFATE) 1 GM/10ML suspension Take 10 mLs (1 g total) by mouth 2 (two) times daily for 21 days. 12/11/21 01/01/22  Godfrey Pick, MD  TRUEPLUS LANCETS 28G MISC 1 each by  Does not apply route 4 (four) times daily - after meals and at bedtime. 12/09/15   Tresa Garter, MD      Allergies    Other, Shellfish-derived products, and Cefdinir    Review of Systems   Review of Systems  Physical Exam Updated Vital Signs BP 111/76 (BP Location: Right Arm)   Pulse 81   Temp 98 F (36.7 C) (Oral)   Resp 16   Ht _0  (1.575 m)   Wt 78.5 kg   LMP 01/24/2022 (Approximate)   SpO2 100%   BMI 31.64 kg/m  Physical Exam Vitals and nursing note reviewed.  Constitutional:      General: She is not in acute distress.     Appearance: She is not ill-appearing.  HENT:     Head: Atraumatic.  Eyes:     Conjunctiva/sclera: Conjunctivae normal.  Cardiovascular:     Rate and Rhythm: Normal rate and regular rhythm.     Pulses: Normal pulses.     Heart sounds: No murmur heard. Pulmonary:     Effort: Pulmonary effort is normal. No respiratory distress.     Breath sounds: Normal breath sounds. No wheezing or rhonchi.  Abdominal:     General: Abdomen is flat. There is no distension.     Palpations: Abdomen is soft.     Tenderness: There is no abdominal tenderness.  Musculoskeletal:        General: Normal range of motion.     Cervical back: Normal range of motion.  Skin:    General: Skin is warm and dry.     Capillary Refill: Capillary refill takes less than 2 seconds.  Neurological:     General: No focal deficit present.     Mental Status: She is alert.  Psychiatric:        Mood and Affect: Mood normal.     ED Results / Procedures / Treatments   Labs (all labs ordered are listed, but only abnormal results are displayed) Labs Reviewed  CBC WITH DIFFERENTIAL/PLATELET - Abnormal; Notable for the following components:      Result Value   RBC 5.18 (*)    MCV 73.2 (*)    MCH 23.2 (*)    All other components within normal limits  BASIC METABOLIC PANEL - Abnormal; Notable for the following components:   Sodium 134 (*)    Glucose, Bld 340 (*)    All other components within normal limits    EKG None  Radiology DG Chest 2 View  Result Date: 02/11/2022 CLINICAL DATA:  Cough EXAM: CHEST - 2 VIEW COMPARISON:  12/11/2021 FINDINGS: The heart size and mediastinal contours are within normal limits. Both lungs are clear. The visualized skeletal structures are unremarkable. IMPRESSION: Normal study. Electronically Signed   By: Rolm Baptise M.D.   On: 02/11/2022 13:23    Procedures Procedures    Medications Ordered in ED Medications  ipratropium-albuterol (DUONEB) 0.5-2.5 (3) MG/3ML nebulizer solution 3  mL (3 mLs Nebulization Given 02/11/22 1359)    ED Course/ Medical Decision Making/ A&P                           Medical Decision Making Risk Prescription drug management.   Social determinants of health:  Social History   Socioeconomic History   Marital status: Single    Spouse name: Not on file   Number of children: Not on file   Years of education: Not on file  Highest education level: Not on file  Occupational History   Not on file  Tobacco Use   Smoking status: Never   Smokeless tobacco: Never  Vaping Use   Vaping Use: Never used  Substance and Sexual Activity   Alcohol use: No   Drug use: No   Sexual activity: Yes    Birth control/protection: None  Other Topics Concern   Not on file  Social History Narrative   Not on file   Social Determinants of Health   Financial Resource Strain: Not on file  Food Insecurity: Not on file  Transportation Needs: Not on file  Physical Activity: Not on file  Stress: Not on file  Social Connections: Not on file  Intimate Partner Violence: Not on file     Initial impression:  This patient presents to the ED for concern of productive cough x3 weeks, this involves an extensive number of treatment options, and is a complaint that carries with it a high risk of complications and morbidity.   Differentials include URI, pneumonia, asthma exacerbation, allergies.   Comorbidities affecting care:  Asthma  Additional history obtained: Nursing note reviewed  Lab Tests  I Ordered, reviewed, and interpreted labs and EKG.  The pertinent results include:  BMP with glucose 340 CBC unremarkable  Imaging Studies ordered:  I ordered imaging studies including  Chest x-ray without acute findings I independently visualized and interpreted imaging and I agree with the radiologist interpretation.    Medicines ordered and prescription drug management:  I ordered medication including: Duonebs Reevaluation of the patient after these  medicines showed that the patient improved I have reviewed the patients home medicines and have made adjustments as needed   ED Course/Re-evaluation: Presents in no acute distress and is nontoxic.  Vitals without significant abnormality.  Satting at 100% on room air without increased respiratory effort.  No wheezes rales or rhonchi on exam.  She was given DuoNeb treatment to help with symptoms.  Initially offered steroids as well, however given history of diabetes patient wishes to refrain.  I ordered chest x-ray which was without acute findings.  Labs reassuring.  Glucose of 340.  I discussed this with patient and she states she has not taken her metformin for about 1 week while she was on vacation.  She will restart this today.  Return precautions discussed.  Given duration of symptoms and worsening productive cough, will discharge home with azithromycin x5 days.  Patient states that she typically gets yeast infections with antibiotics and is requesting 1 tablet of Diflucan should she develop 1.  Patient discharged home in stable condition.  Disposition:  After consideration of the diagnostic results, physical exam, history and the patients response to treatment feel that the patent would benefit from discharge.   URI: Plan and management as described above. Discharged home in good condition.   Final Clinical Impression(s) / ED Diagnoses Final diagnoses:  Viral upper respiratory tract infection    Rx / DC Orders ED Discharge Orders          Ordered    azithromycin (ZITHROMAX) 250 MG tablet  Daily        02/11/22 1450    fluconazole (DIFLUCAN) 200 MG tablet  Daily        02/11/22 1454              Tonye Pearson, [place] 02/11/22 1517    Hayden Rasmussen, MD 02/11/22 1711

## 2022-02-11 NOTE — Discharge Instructions (Signed)
Your chest x-ray was negative for infection such as pneumonia, however your symptoms been having a productive cough for nearly 3 weeks, I have sent you in an antibiotic that you will take as directed.  Continue to use your albuterol inhaler as needed.  While your labs were overall reassuring, your blood glucose was quite elevated.  Please restart your metformin as soon as possible.  Return if you develop symptoms including abdominal pain, vomiting or severe fatigue.

## 2022-02-11 NOTE — ED Triage Notes (Signed)
Patient c/o chest congestion and asthma flare up x 3 weeks.  Patient states she has been using an Albuterol inhaler.

## 2022-08-05 ENCOUNTER — Emergency Department (HOSPITAL_COMMUNITY)
Admission: EM | Admit: 2022-08-05 | Discharge: 2022-08-05 | Disposition: A | Discharge status: Home / Self Care | Payer: Medicaid Other | Attending: Emergency Medicine | Admitting: Emergency Medicine

## 2022-08-05 ENCOUNTER — Encounter (HOSPITAL_COMMUNITY): Payer: Self-pay

## 2022-08-05 DIAGNOSIS — J45909 Unspecified asthma, uncomplicated: Secondary | ICD-10-CM | POA: Insufficient documentation

## 2022-08-05 DIAGNOSIS — K029 Dental caries, unspecified: Secondary | ICD-10-CM | POA: Insufficient documentation

## 2022-08-05 DIAGNOSIS — Z7984 Long term (current) use of oral hypoglycemic drugs: Secondary | ICD-10-CM | POA: Insufficient documentation

## 2022-08-05 DIAGNOSIS — K0889 Other specified disorders of teeth and supporting structures: Secondary | ICD-10-CM

## 2022-08-05 DIAGNOSIS — E119 Type 2 diabetes mellitus without complications: Secondary | ICD-10-CM | POA: Insufficient documentation

## 2022-08-05 MED ORDER — CLINDAMYCIN HCL 150 MG PO CAPS: 450.0000 mg | ORAL_CAPSULE | Freq: Three times a day (TID) | ORAL | 0 refills | Status: AC

## 2022-08-05 MED ORDER — FLUCONAZOLE 200 MG PO TABS: 200.0000 mg | ORAL_TABLET | Freq: Once | ORAL | 0 refills | Status: AC

## 2022-08-05 NOTE — ED Triage Notes (Signed)
Pt states right upper dental pain x 1 week. Pt needs dentist referral.

## 2022-08-05 NOTE — ED Provider Notes (Signed)
[facility] Provider Note   CSN: [id number] Arrival date & time: 08/05/22  1620     History  Chief Complaint  Patient presents with   Dental Pain    [name] is a 37 y.o. female with past medical history of type 2 diabetes and asthma who presents to the ED complaining of right-sided lower dental pain for the last week.  Patient states that she has not been to a dentist in a long time.  She denies associated fever, chills, nausea, vomiting, difficulty swallowing, shortness of breath, chest pain, or other symptoms.  She has not recently been on antibiotics.  She states that her blood sugars have been well-controlled and less than 200 at home recently.  She does state that she would like to be referred to a dentist.  She notes that when she takes antibiotics she frequently develops candidal infections and would like a dose of fluconazole if given antibiotics today.    Home Medications Prior to Admission medications   Medication Sig Start Date End Date Taking? Authorizing Provider  albuterol (PROVENTIL HFA;VENTOLIN HFA) 108 (90 Base) MCG/ACT inhaler Inhale 1-2 puffs into the lungs every 6 (six) hours as needed for wheezing or shortness of breath. Patient taking differently: Inhale 2 puffs into the lungs every 6 (six) hours as needed for wheezing or shortness of breath. 08/16/17   Doristine Devoid, PA-C  albuterol (PROVENTIL) (5 MG/ML) 0.5% nebulizer solution Take 0.5 mLs (2.5 mg total) by nebulization every 6 (six) hours as needed for wheezing or shortness of breath. Patient not taking: Reported on 07/27/2021 08/16/17   Ocie Cornfield T, PA-C  amoxicillin-clavulanate (AUGMENTIN) 875-125 MG tablet Take 1 tablet by mouth every 12 (twelve) hours. 10/15/21   Azucena Cecil, PA-C  azithromycin (ZITHROMAX) 250 MG tablet Take 1 tablet (250 mg total) by mouth daily. Take first 2 tablets together, then 1 every day until finished. 02/11/22    Tonye Pearson, PA-C  Blood Glucose Monitoring Suppl (TRUE METRIX METER) w/Device KIT 1 each by Does not apply route 4 (four) times daily -  before meals and at bedtime. 12/09/15   Tresa Garter, MD  cyclobenzaprine (FLEXERIL) 10 MG tablet Take 1 tablet (10 mg total) by mouth 3 (three) times daily as needed for muscle spasms. 08/06/21   Montine Circle, PA-C  famotidine (PEPCID) 20 MG tablet Take 1 tablet (20 mg total) by mouth 2 (two) times daily. 12/11/21   Godfrey Pick, MD  glucose blood (TRUE METRIX BLOOD GLUCOSE TEST) test strip Use as instructed 12/09/15   Tresa Garter, MD  metFORMIN (GLUCOPHAGE) 500 MG tablet Take 1 tablet (500 mg total) by mouth 2 (two) times daily with a meal. 02/25/20 07/28/22  Dorie Rank, MD  methocarbamol (ROBAXIN) 500 MG tablet Take 1 tablet (500 mg total) by mouth every 8 (eight) hours as needed for muscle spasms. 12/08/21   Maudie Flakes, MD  Multiple Vitamin (MULTIVITAMIN ADULT PO) Take 1 tablet by mouth daily.    [provider]  naproxen (NAPROSYN) 500 MG tablet Take 1 tablet (500 mg total) by mouth 2 (two) times daily. 12/08/21   Maudie Flakes, MD  ondansetron (ZOFRAN-ODT) 4 MG disintegrating tablet Take 1 tablet (4 mg total) by mouth every 8 (eight) hours as needed for nausea or vomiting. 12/11/21   Godfrey Pick, MD  sucralfate (CARAFATE) 1 GM/10ML suspension Take 10 mLs (1 g total) by mouth 2 (two) times daily for  21 days. 12/11/21 01/01/22  Godfrey Pick, MD  TRUEPLUS LANCETS 28G MISC 1 each by Does not apply route 4 (four) times daily - after meals and at bedtime. 12/09/15   Tresa Garter, MD      Allergies    Other, Shellfish-derived products, and Cefdinir    Review of Systems   Review of Systems  All other systems reviewed and are negative.   Physical Exam Updated Vital Signs BP 138/83 (BP Location: Left Arm)   Pulse 98   Temp 98.2 F (36.8 C) (Oral)   Resp 16   Ht 5\' 2"  (1.575 m)   Wt 77.6 kg   LMP 08/05/2022   SpO2 100%   BMI  31.28 kg/m  Physical Exam Vitals and nursing note reviewed.  Constitutional:      General: She is not in acute distress.    Appearance: Normal appearance. She is not ill-appearing or toxic-appearing.  HENT:     Head: Normocephalic and atraumatic.     Jaw: There is normal jaw occlusion. No swelling, pain on movement or malocclusion.     Mouth/Throat:     Mouth: Mucous membranes are moist.     Dentition: Dental caries (multiple) present.     Pharynx: Oropharynx is clear. Uvula midline. No pharyngeal swelling, oropharyngeal exudate, posterior oropharyngeal erythema or uvula swelling.     Tonsils: No tonsillar exudate or tonsillar abscesses.      Comments: Tenderness, gingival erythema and swelling around tooth noted above, no notable fluctuance, induration, or drainage, no obvious dental abscess, no submental, sublingual, or submandibular tenderness, swelling, induration, or fluctuance, widely patent airway Eyes:     Conjunctiva/sclera: Conjunctivae normal.  Cardiovascular:     Rate and Rhythm: Normal rate and regular rhythm.     Heart sounds: No murmur heard. Pulmonary:     Effort: Pulmonary effort is normal. No respiratory distress.     Breath sounds: Normal breath sounds. No stridor. No wheezing, rhonchi or rales.  Abdominal:     General: Abdomen is flat.     Palpations: Abdomen is soft.  Musculoskeletal:        General: Normal range of motion.     Cervical back: Normal range of motion and neck supple.     Right lower leg: No edema.     Left lower leg: No edema.  Skin:    General: Skin is warm and dry.     Capillary Refill: Capillary refill takes less than 2 seconds.  Neurological:     General: No focal deficit present.     Mental Status: She is alert and oriented to person, place, and time.     Cranial Nerves: No cranial nerve deficit.  Psychiatric:        Behavior: Behavior normal.     ED Results / Procedures / Treatments   Labs (all labs ordered are listed, but only  abnormal results are displayed) Labs Reviewed - No data to display  EKG None  Radiology No results found.  Procedures Procedures    Medications Ordered in ED Medications - No data to display  ED Course/ Medical Decision Making/ A&P                             Medical Decision Making  Medical Decision Making:   MADELEINE LAHNER is a 37 y.o. female who presented to the ED today with dental pain detailed above.    Patient's presentation is  complicated by their history of T2DM.  Complete initial physical exam performed, notably the patient  was in no acute distress. Lungs clear to auscultation.  Widely patent airway.  No signs of airway obstruction.  Patient speaking in full sentences and tolerating secretions with ease without signs of acute distress.  Tenderness over tooth noted above with gingival erythema and swelling surrounding.  No previous dental abscess.    Reviewed and confirmed nursing documentation for past medical history, family history, social history.    Initial Assessment:   With the patient's presentation of dental pain, differential diagnosis includes but is not limited to dental caries, dental fracture, dental abscess, peritonsillar abscess, retropharyngeal abscess, Ludwig's angina, gingivitis.  This is most consistent with an acute complicated illness  Initial Plan:  Offered pain management in the ED but patient declines Antibiotic therapy Dental referral Objective evaluation as reviewed    Final Assessment and Plan:   This is a 37 year old female who presents the ED complaining of right-sided lower dental pain for the past week.  No recent dental evaluation.  No fever or other associated systemic symptoms.  Unremarkable vital signs.  Tenderness over to tented up with gingival erythema and swelling no obvious abscess.  No signs of Ludwig's angina.  No signs of airway obstruction.  Lungs clear to auscultation.  Patient nontoxic-appearing.  Patient requesting  dental follow-up.  History of type 2 diabetes but recently with good glycemic control.  She does note frequent yeast infections with antibiotic use will give one-time dose of fluconazole with antibiotics.  Will give her antibiotic coverage for any potential underlying infection as well as dental follow-up.  Patient given strict ED return precautions.  Patient expressed understanding of discharge plan, all questions answered, and stable for discharge.   Clinical Impression:  1. Dentalgia      Data Unavailable           Final Clinical Impression(s) / ED Diagnoses Final diagnoses:  Dentalgia    Rx / DC Orders ED Discharge Orders     None         Suzzette Righter, PA-C 08/05/22 1706    Kommor, Debe Coder, MD 08/06/22 1329

## 2022-08-05 NOTE — Discharge Instructions (Signed)
Thank you for letting us take care of you today.  I prescribed antibiotics to treat your dental infection.  Please call and schedule follow-up with a dentist in 48 hours.  I provided the information for our on-call dentist who I recommend you call to schedule this appointment.  I also prescribed a one-time dose of fluconazole for any onset of yeast infection.  For pain, you may take over the counter Tylenol and Motrin. You may take up to 1000mg  Tylenol every 6 hours. Do not take more than 4000mg  Tylenol in 24 hours. You may take up to 600-800mg  ibuprofen every 6 hours. Do not take more than 3200mg  ibuprofen in 24 hours.   For any new or worsening symptoms such as high fevers, difficulty swallowing, difficulty breathing, chest pain, or other new, concerning symptoms, please return to nearest emergency department for re-evaluation.

## 2023-02-06 ENCOUNTER — Ambulatory Visit (HOSPITAL_COMMUNITY): Payer: Medicaid Other

## 2023-11-05 ENCOUNTER — Emergency Department (HOSPITAL_COMMUNITY)
Admission: EM | Admit: 2023-11-05 | Discharge: 2023-11-06 | Discharge status: Against Medical Advice | Attending: Emergency Medicine | Admitting: Emergency Medicine

## 2023-11-05 ENCOUNTER — Other Ambulatory Visit: Payer: Self-pay

## 2023-11-05 ENCOUNTER — Emergency Department (HOSPITAL_COMMUNITY)

## 2023-11-05 ENCOUNTER — Encounter (HOSPITAL_COMMUNITY): Payer: Self-pay | Admitting: Emergency Medicine

## 2023-11-05 DIAGNOSIS — Z5321 Procedure and treatment not carried out due to patient leaving prior to being seen by health care provider: Secondary | ICD-10-CM | POA: Insufficient documentation

## 2023-11-05 DIAGNOSIS — R0789 Other chest pain: Secondary | ICD-10-CM | POA: Diagnosis present

## 2023-11-05 NOTE — ED Triage Notes (Signed)
 Pt presents with centralized epigastric chest tightness and feeling that she is having difficulty swallowing. No cardiac hx.  Pt reports symptoms began today. Non radiating. No n/v/d or shob. No fever or chills.
